# Patient Record
Sex: Male | Born: 1940 | Race: White | Hispanic: No | Smoking: Former smoker
Health system: Southern US, Community
[De-identification: ages and names within clinical notes are randomized; demographics above are authoritative.]

## PROBLEM LIST (undated history)

## (undated) DIAGNOSIS — I10 Essential (primary) hypertension: Secondary | ICD-10-CM

## (undated) DIAGNOSIS — K5792 Diverticulitis of intestine, part unspecified, without perforation or abscess without bleeding: Secondary | ICD-10-CM

## (undated) DIAGNOSIS — E785 Hyperlipidemia, unspecified: Secondary | ICD-10-CM

## (undated) DIAGNOSIS — N189 Chronic kidney disease, unspecified: Secondary | ICD-10-CM

## (undated) DIAGNOSIS — N433 Hydrocele, unspecified: Secondary | ICD-10-CM

## (undated) DIAGNOSIS — D649 Anemia, unspecified: Secondary | ICD-10-CM

## (undated) DIAGNOSIS — K7689 Other specified diseases of liver: Secondary | ICD-10-CM

## (undated) DIAGNOSIS — I714 Abdominal aortic aneurysm, without rupture, unspecified: Secondary | ICD-10-CM

## (undated) DIAGNOSIS — K759 Inflammatory liver disease, unspecified: Secondary | ICD-10-CM

## (undated) DIAGNOSIS — Z940 Kidney transplant status: Secondary | ICD-10-CM

## (undated) DIAGNOSIS — N179 Acute kidney failure, unspecified: Secondary | ICD-10-CM

## (undated) DIAGNOSIS — C801 Malignant (primary) neoplasm, unspecified: Secondary | ICD-10-CM

## (undated) DIAGNOSIS — E790 Hyperuricemia without signs of inflammatory arthritis and tophaceous disease: Secondary | ICD-10-CM

## (undated) DIAGNOSIS — M1990 Unspecified osteoarthritis, unspecified site: Secondary | ICD-10-CM

## (undated) DIAGNOSIS — M109 Gout, unspecified: Secondary | ICD-10-CM

## (undated) DIAGNOSIS — R7303 Prediabetes: Secondary | ICD-10-CM

## (undated) HISTORY — PX: COLONOSCOPY: SHX174

## (undated) HISTORY — DX: Acute kidney failure, unspecified: N17.9

## (undated) HISTORY — PX: KIDNEY TRANSPLANT: SHX239

## (undated) HISTORY — PX: HERNIA REPAIR: SHX51

## (undated) HISTORY — PX: OTHER SURGICAL HISTORY: SHX169

## (undated) HISTORY — DX: Chronic kidney disease, unspecified: N18.9

---

## 1995-12-12 DIAGNOSIS — Z940 Kidney transplant status: Secondary | ICD-10-CM

## 1995-12-12 HISTORY — DX: Kidney transplant status: Z94.0

## 2017-03-19 DIAGNOSIS — K5792 Diverticulitis of intestine, part unspecified, without perforation or abscess without bleeding: Secondary | ICD-10-CM

## 2017-03-19 HISTORY — DX: Diverticulitis of intestine, part unspecified, without perforation or abscess without bleeding: K57.92

## 2017-03-24 ENCOUNTER — Inpatient Hospital Stay (HOSPITAL_COMMUNITY): Payer: Medicare Other

## 2017-03-24 ENCOUNTER — Inpatient Hospital Stay (HOSPITAL_COMMUNITY)
Admission: EM | Admit: 2017-03-24 | Discharge: 2017-03-28 | DRG: 378 | Disposition: A | Discharge status: Home Health | Payer: Medicare Other | Attending: Internal Medicine | Admitting: Internal Medicine

## 2017-03-24 ENCOUNTER — Encounter (HOSPITAL_COMMUNITY): Payer: Self-pay

## 2017-03-24 DIAGNOSIS — Z87891 Personal history of nicotine dependence: Secondary | ICD-10-CM | POA: Diagnosis not present

## 2017-03-24 DIAGNOSIS — D509 Iron deficiency anemia, unspecified: Secondary | ICD-10-CM | POA: Diagnosis present

## 2017-03-24 DIAGNOSIS — K625 Hemorrhage of anus and rectum: Secondary | ICD-10-CM

## 2017-03-24 DIAGNOSIS — E875 Hyperkalemia: Secondary | ICD-10-CM | POA: Diagnosis present

## 2017-03-24 DIAGNOSIS — I129 Hypertensive chronic kidney disease with stage 1 through stage 4 chronic kidney disease, or unspecified chronic kidney disease: Secondary | ICD-10-CM | POA: Diagnosis present

## 2017-03-24 DIAGNOSIS — N179 Acute kidney failure, unspecified: Secondary | ICD-10-CM

## 2017-03-24 DIAGNOSIS — Z940 Kidney transplant status: Secondary | ICD-10-CM | POA: Diagnosis not present

## 2017-03-24 DIAGNOSIS — D62 Acute posthemorrhagic anemia: Secondary | ICD-10-CM

## 2017-03-24 DIAGNOSIS — T380X5A Adverse effect of glucocorticoids and synthetic analogues, initial encounter: Secondary | ICD-10-CM | POA: Diagnosis present

## 2017-03-24 DIAGNOSIS — K922 Gastrointestinal hemorrhage, unspecified: Secondary | ICD-10-CM | POA: Diagnosis present

## 2017-03-24 DIAGNOSIS — K5792 Diverticulitis of intestine, part unspecified, without perforation or abscess without bleeding: Secondary | ICD-10-CM | POA: Diagnosis present

## 2017-03-24 DIAGNOSIS — K7689 Other specified diseases of liver: Secondary | ICD-10-CM

## 2017-03-24 DIAGNOSIS — I951 Orthostatic hypotension: Secondary | ICD-10-CM | POA: Diagnosis present

## 2017-03-24 DIAGNOSIS — D72829 Elevated white blood cell count, unspecified: Secondary | ICD-10-CM

## 2017-03-24 DIAGNOSIS — E860 Dehydration: Secondary | ICD-10-CM | POA: Diagnosis not present

## 2017-03-24 DIAGNOSIS — Z8249 Family history of ischemic heart disease and other diseases of the circulatory system: Secondary | ICD-10-CM | POA: Diagnosis not present

## 2017-03-24 DIAGNOSIS — K921 Melena: Secondary | ICD-10-CM | POA: Diagnosis present

## 2017-03-24 DIAGNOSIS — I714 Abdominal aortic aneurysm, without rupture, unspecified: Secondary | ICD-10-CM

## 2017-03-24 DIAGNOSIS — T8611 Kidney transplant rejection: Secondary | ICD-10-CM | POA: Diagnosis present

## 2017-03-24 DIAGNOSIS — E538 Deficiency of other specified B group vitamins: Secondary | ICD-10-CM | POA: Diagnosis not present

## 2017-03-24 DIAGNOSIS — N028 Recurrent and persistent hematuria with other morphologic changes: Secondary | ICD-10-CM | POA: Diagnosis present

## 2017-03-24 DIAGNOSIS — N184 Chronic kidney disease, stage 4 (severe): Secondary | ICD-10-CM | POA: Diagnosis present

## 2017-03-24 DIAGNOSIS — N281 Cyst of kidney, acquired: Secondary | ICD-10-CM

## 2017-03-24 DIAGNOSIS — E785 Hyperlipidemia, unspecified: Secondary | ICD-10-CM | POA: Diagnosis present

## 2017-03-24 DIAGNOSIS — E790 Hyperuricemia without signs of inflammatory arthritis and tophaceous disease: Secondary | ICD-10-CM | POA: Diagnosis not present

## 2017-03-24 DIAGNOSIS — I10 Essential (primary) hypertension: Secondary | ICD-10-CM | POA: Diagnosis present

## 2017-03-24 DIAGNOSIS — R000 Tachycardia, unspecified: Secondary | ICD-10-CM

## 2017-03-24 DIAGNOSIS — N189 Chronic kidney disease, unspecified: Secondary | ICD-10-CM

## 2017-03-24 DIAGNOSIS — Z7982 Long term (current) use of aspirin: Secondary | ICD-10-CM

## 2017-03-24 DIAGNOSIS — K5733 Diverticulitis of large intestine without perforation or abscess with bleeding: Principal | ICD-10-CM | POA: Diagnosis present

## 2017-03-24 DIAGNOSIS — K5793 Diverticulitis of intestine, part unspecified, without perforation or abscess with bleeding: Secondary | ICD-10-CM | POA: Diagnosis not present

## 2017-03-24 HISTORY — DX: Chronic kidney disease, unspecified: N18.9

## 2017-03-24 HISTORY — DX: Abdominal aortic aneurysm, without rupture, unspecified: I71.40

## 2017-03-24 HISTORY — DX: Abdominal aortic aneurysm, without rupture: I71.4

## 2017-03-24 HISTORY — DX: Hyperuricemia without signs of inflammatory arthritis and tophaceous disease: E79.0

## 2017-03-24 HISTORY — DX: Other specified diseases of liver: K76.89

## 2017-03-24 HISTORY — DX: Acute kidney failure, unspecified: N17.9

## 2017-03-24 HISTORY — DX: Hydrocele, unspecified: N43.3

## 2017-03-24 HISTORY — DX: Kidney transplant status: Z94.0

## 2017-03-24 HISTORY — DX: Hyperlipidemia, unspecified: E78.5

## 2017-03-24 HISTORY — DX: Essential (primary) hypertension: I10

## 2017-03-24 LAB — BASIC METABOLIC PANEL WITH GFR
Anion gap: 14 (ref 5–15)
BUN: 95 mg/dL — ABNORMAL HIGH (ref 6–20)
CO2: 22 mmol/L (ref 22–32)
Calcium: 8.8 mg/dL — ABNORMAL LOW (ref 8.9–10.3)
Chloride: 100 mmol/L — ABNORMAL LOW (ref 101–111)
Creatinine, Ser: 3.35 mg/dL — ABNORMAL HIGH (ref 0.61–1.24)
GFR calc Af Amer: 19 mL/min — ABNORMAL LOW
GFR calc non Af Amer: 16 mL/min — ABNORMAL LOW
Glucose, Bld: 195 mg/dL — ABNORMAL HIGH (ref 65–99)
Potassium: 4.6 mmol/L (ref 3.5–5.1)
Sodium: 136 mmol/L (ref 135–145)

## 2017-03-24 LAB — CBC
HCT: 29.2 % — ABNORMAL LOW (ref 39.0–52.0)
Hemoglobin: 9.4 g/dL — ABNORMAL LOW (ref 13.0–17.0)
MCH: 29.7 pg (ref 26.0–34.0)
MCHC: 32.2 g/dL (ref 30.0–36.0)
MCV: 92.1 fL (ref 78.0–100.0)
PLATELETS: 264 10*3/uL (ref 150–400)
RBC: 3.17 MIL/uL — ABNORMAL LOW (ref 4.22–5.81)
RDW: 14.7 % (ref 11.5–15.5)
WBC: 12.9 10*3/uL — AB (ref 4.0–10.5)

## 2017-03-24 LAB — CBC WITH DIFFERENTIAL/PLATELET
BASOS PCT: 0 %
Basophils Absolute: 0 10*3/uL (ref 0.0–0.1)
EOS ABS: 0.1 10*3/uL (ref 0.0–0.7)
Eosinophils Relative: 1 %
HCT: 28.2 % — ABNORMAL LOW (ref 39.0–52.0)
Hemoglobin: 9.3 g/dL — ABNORMAL LOW (ref 13.0–17.0)
Lymphocytes Relative: 7 %
Lymphs Abs: 0.9 10*3/uL (ref 0.7–4.0)
MCH: 30.2 pg (ref 26.0–34.0)
MCHC: 33 g/dL (ref 30.0–36.0)
MCV: 91.6 fL (ref 78.0–100.0)
MONO ABS: 0.8 10*3/uL (ref 0.1–1.0)
Monocytes Relative: 6 %
NEUTROS PCT: 86 %
Neutro Abs: 11.1 10*3/uL — ABNORMAL HIGH (ref 1.7–7.7)
PLATELETS: 215 10*3/uL (ref 150–400)
RBC: 3.08 MIL/uL — AB (ref 4.22–5.81)
RDW: 14.8 % (ref 11.5–15.5)
WBC: 12.9 10*3/uL — AB (ref 4.0–10.5)

## 2017-03-24 LAB — MRSA PCR SCREENING: MRSA BY PCR: NEGATIVE

## 2017-03-24 LAB — IRON AND TIBC
Iron: 19 ug/dL — ABNORMAL LOW (ref 45–182)
Saturation Ratios: 9 % — ABNORMAL LOW (ref 17.9–39.5)
TIBC: 223 ug/dL — ABNORMAL LOW (ref 250–450)
UIBC: 204 ug/dL

## 2017-03-24 LAB — FOLATE: Folate: 23.9 ng/mL (ref >5.9)

## 2017-03-24 LAB — RETICULOCYTES
RBC.: 3.17 MIL/uL — AB (ref 4.22–5.81)
RETIC COUNT ABSOLUTE: 57.1 10*3/uL (ref 19.0–186.0)
RETIC CT PCT: 1.8 % (ref 0.4–3.1)

## 2017-03-24 LAB — FERRITIN: FERRITIN: 52 ng/mL (ref 24–336)

## 2017-03-24 LAB — VITAMIN B12: Vitamin B-12: 357 pg/mL (ref 180–914)

## 2017-03-24 MED ORDER — METOPROLOL TARTRATE 25 MG PO TABS
50.0000 mg | ORAL_TABLET | Freq: Two times a day (BID) | ORAL | Status: DC
  Administered 2017-03-24 – 2017-03-25 (×3): 50 mg via ORAL
  Filled 2017-03-24 (×3): qty 2

## 2017-03-24 MED ORDER — METRONIDAZOLE IN NACL 5-0.79 MG/ML-% IV SOLN
500.0000 mg | Freq: Three times a day (TID) | INTRAVENOUS | Status: DC
  Administered 2017-03-24 – 2017-03-27 (×9): 500 mg via INTRAVENOUS
  Filled 2017-03-24 (×10): qty 100

## 2017-03-24 MED ORDER — OXYCODONE HCL 5 MG PO TABS
5.0000 mg | ORAL_TABLET | ORAL | Status: DC | PRN
  Administered 2017-03-24: 5 mg via ORAL
  Filled 2017-03-24: qty 1

## 2017-03-24 MED ORDER — CALCIUM CARBONATE 1250 (500 CA) MG PO TABS
1.0000 | ORAL_TABLET | Freq: Every day | ORAL | Status: DC
  Administered 2017-03-24 – 2017-03-27 (×4): 500 mg via ORAL
  Filled 2017-03-24 (×3): qty 1

## 2017-03-24 MED ORDER — ACETAMINOPHEN 325 MG PO TABS: 650.0000 mg | ORAL_TABLET | Freq: Four times a day (QID) | ORAL | Status: DC | PRN

## 2017-03-24 MED ORDER — SORBITOL 70 % SOLN
30.0000 mL | Freq: Every day | Status: DC | PRN
  Filled 2017-03-24: qty 30

## 2017-03-24 MED ORDER — CALCIUM GLUCONATE 500 MG PO TABS: 500.0000 mg | ORAL_TABLET | Freq: Two times a day (BID) | ORAL | Status: DC

## 2017-03-24 MED ORDER — PREDNISONE 5 MG PO TABS
5.0000 mg | ORAL_TABLET | Freq: Every day | ORAL | Status: DC
  Administered 2017-03-25 – 2017-03-28 (×4): 5 mg via ORAL
  Filled 2017-03-24 (×4): qty 1

## 2017-03-24 MED ORDER — SODIUM CHLORIDE 0.9 % IV BOLUS (SEPSIS)
500.0000 mL | Freq: Once | INTRAVENOUS | Status: AC
  Administered 2017-03-24: 500 mL via INTRAVENOUS

## 2017-03-24 MED ORDER — CALCIUM GLUCONATE 500 MG PO TABS: 2.0000 | ORAL_TABLET | Freq: Every morning | ORAL | Status: DC

## 2017-03-24 MED ORDER — CYCLOSPORINE MODIFIED (NEORAL) 100 MG PO CAPS
100.0000 mg | ORAL_CAPSULE | Freq: Two times a day (BID) | ORAL | Status: DC
  Administered 2017-03-24 – 2017-03-25 (×2): 100 mg via ORAL
  Filled 2017-03-24 (×2): qty 1

## 2017-03-24 MED ORDER — ALLOPURINOL 100 MG PO TABS
100.0000 mg | ORAL_TABLET | Freq: Two times a day (BID) | ORAL | Status: DC
  Administered 2017-03-24 – 2017-03-28 (×8): 100 mg via ORAL
  Filled 2017-03-24 (×8): qty 1

## 2017-03-24 MED ORDER — ACETAMINOPHEN 650 MG RE SUPP: 650.0000 mg | Freq: Four times a day (QID) | RECTAL | Status: DC | PRN

## 2017-03-24 MED ORDER — SENNOSIDES-DOCUSATE SODIUM 8.6-50 MG PO TABS: 1.0000 | ORAL_TABLET | Freq: Every evening | ORAL | Status: DC | PRN

## 2017-03-24 MED ORDER — CALCIUM CARBONATE 1250 (500 CA) MG PO TABS
2.0000 | ORAL_TABLET | Freq: Every day | ORAL | Status: DC
  Administered 2017-03-25 – 2017-03-28 (×4): 1000 mg via ORAL
  Filled 2017-03-24: qty 1
  Filled 2017-03-24: qty 2
  Filled 2017-03-24 (×4): qty 1
  Filled 2017-03-24: qty 2

## 2017-03-24 MED ORDER — SODIUM CHLORIDE 0.9 % IV SOLN
INTRAVENOUS | Status: AC
  Administered 2017-03-24 – 2017-03-25 (×2): via INTRAVENOUS

## 2017-03-24 MED ORDER — CIPROFLOXACIN IN D5W 200 MG/100ML IV SOLN
200.0000 mg | Freq: Two times a day (BID) | INTRAVENOUS | Status: DC
  Administered 2017-03-24 – 2017-03-27 (×6): 200 mg via INTRAVENOUS
  Filled 2017-03-24 (×6): qty 100

## 2017-03-24 MED ORDER — ONDANSETRON HCL 4 MG PO TABS: 4.0000 mg | ORAL_TABLET | Freq: Four times a day (QID) | ORAL | Status: DC | PRN

## 2017-03-24 MED ORDER — ATORVASTATIN CALCIUM 10 MG PO TABS
20.0000 mg | ORAL_TABLET | Freq: Every day | ORAL | Status: DC
  Administered 2017-03-25 – 2017-03-28 (×4): 20 mg via ORAL
  Filled 2017-03-24 (×2): qty 2
  Filled 2017-03-24 (×2): qty 1
  Filled 2017-03-24: qty 2
  Filled 2017-03-24: qty 1
  Filled 2017-03-24: qty 2

## 2017-03-24 MED ORDER — ONDANSETRON HCL 4 MG/2ML IJ SOLN: 4.0000 mg | Freq: Four times a day (QID) | INTRAMUSCULAR | Status: DC | PRN

## 2017-03-24 MED ORDER — IOPAMIDOL (ISOVUE-300) INJECTION 61%
INTRAVENOUS | Status: AC
  Administered 2017-03-24: 30 mL via ORAL
  Filled 2017-03-24: qty 30

## 2017-03-24 MED ORDER — CALCIUM GLUCONATE 500 MG PO TABS
1.0000 | ORAL_TABLET | Freq: Every day | ORAL | Status: DC
  Filled 2017-03-24: qty 1

## 2017-03-24 NOTE — H&P (Signed)
History and Physical    Caleb Hicks BDZ:[id number] DOB: Sep 13, 1940 DOA: 03/24/2017  PCP: Dr Linnell Fulling [facility]) Patient coming from: Home  I have personally briefly reviewed patient's old medical records in [place]  Chief Complaint: Bloody stools  HPI: Caleb Hicks is a 76 y.o. male with medical history significant of status post renal transplant approximately 21 years ago, hypertension, hyperlipidemia, AAA who presents to the ED with a one-day history of multiple bloody bowel movements. Patient stated that around 6 or 7 PM the night prior to admission had multiple bloody bowel movements which are bright red in color. Patient stated that bowel movements continued through the morning of admission and he subsequently presented to the ED where he had another bloody bowel movement. Patient denies any melanotic stools, no hematemesis. Patient denies any fevers, no chills, no nausea, no vomiting, no chest pain, no shortness of breath, no abdominal pain, no diarrhea, no prior history of bloody bowel movements, no history of peptic ulcer disease, no history of diverticulosis, no history of NSAID use. Patient is on chronic prednisone. Patient does state good urine output. Patient does endorse some generalized weakness, near syncope, lightheadedness.  ED Course: Patient seen in the emergency room, basic metabolic profile obtained at a BUN of 95 creatinine of 3.35 otherwise was within normal limits. CBC had a white count of 12.9, hemoglobin of 9.3, platelet count of 2:15. Urinalysis not done. EKG showed a sinus tachycardia. ED physician stated she spoke with gastroenterology from [facility], Dr Remi Haggard who recommended CT abdomen and pelvis and will formally consult on the patient.  Review of Systems: As per HPI otherwise 10 point review of systems negative.   Past Medical History:  Diagnosis Date  . AAA (abdominal aortic aneurysm) ([place]): 4 cm 03/24/2017  . Abdominal aneurysm ([place])   .  Hepatic cyst: Solitary 03/24/2017  . Hydrocele sac   . Hyperlipidemia 03/24/2017  . Hypertension   . Hyperuricemia 03/24/2017  . 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 No pertinent surgical history.   reports that he has quit smoking. He has never used smokeless tobacco. He reports that he drinks about 1.2 oz of alcohol per week . He reports that he does not use drugs.  Allergies  Allergen Reactions  . Norvasc [Amlodipine Besylate]     Pt state it cause him weight gain    Family History  Problem Relation Age of Onset  . Heart attack Father    Mother deceased age 13 from old age. Father deceased age 54 in 71 from a probable MI per patient. Patient is currently divorced and lives in [place] close to his daughter.  Prior to Admission medications   Medication Sig Start Date End Date Taking? Authorizing Provider  allopurinol (ZYLOPRIM) 100 MG tablet Take 100 mg by mouth 2 (two) times daily. 03/13/17  Yes [provider]  atorvastatin (LIPITOR) 20 MG tablet Take 20 mg by mouth daily. 02/20/17  Yes [provider]  calcium gluconate 500 MG tablet Take 500-1,000 mg by mouth 2 (two) times daily.   Yes [provider]  FLUAD 0.5 ML SUSY Inject 1 Dose into the muscle once. 03/16/17  Yes [provider]  furosemide (LASIX) 40 MG tablet Take 40 mg by mouth daily. 03/13/17  Yes [provider]  metoprolol tartrate (LOPRESSOR) 50 MG tablet Take 50 mg by mouth 2 (two) times daily. 12/25/16  Yes [provider]  NEORAL 100 MG capsule Take 100 mg by mouth 2 (two) times daily. 02/27/17  Yes [provider]  predniSONE (DELTASONE) 5 MG tablet Take 5 mg by mouth daily. 03/16/17  Yes [provider]  telmisartan (MICARDIS) 80 MG tablet Take 80 mg by mouth daily. 12/25/16  Yes [provider]  aspirin 325 MG tablet Take 325 mg by mouth daily.    [provider]    Physical Exam: Vitals:   03/24/17 1300 03/24/17 1445 03/24/17 1500 03/24/17 1530  BP: (!) 141/76 124/73 123/75 140/74  Pulse: (!) 115 (!) 120 (!) 118 (!) 112  Resp: 19 (!) 22 19 14   Temp:      TempSrc:      SpO2: 100% 96% 94% 98%  Weight:      Height:        Constitutional: NAD, calm, comfortable Vitals:   03/24/17 1300 03/24/17 1445 03/24/17 1500 03/24/17 1530  BP: (!) 141/76 124/73 123/75 140/74  Pulse: (!) 115 (!) 120 (!) 118 (!) 112  Resp: 19 (!) 22 19 14   Temp:      TempSrc:      SpO2: 100% 96% 94% 98%  Weight:      Height:       Eyes: PERRLA, EOMI, lids and conjunctivae normal ENMT: Mucous membranes are moist. Posterior pharynx clear of any exudate or lesions.Normal dentition.  Neck: normal, supple, no masses, no thyromegaly Respiratory: clear to auscultation bilaterally, no wheezing, no crackles. Normal respiratory effort. No accessory muscle use.  Cardiovascular: Regular rate and rhythm, no murmurs / rubs / gallops. No extremity edema. 2+ pedal pulses. No carotid bruits.  Abdomen: no tenderness, no masses palpated. No hepatosplenomegaly. Bowel sounds positive.  Musculoskeletal: no clubbing / cyanosis. No joint deformity upper and lower extremities. Good ROM, no contractures. Normal muscle tone.  Skin: no rashes, lesions, ulcers. No induration Neurologic: CN 2-12 grossly intact. Sensation intact, DTR normal. Strength 5/5 in all 4.  Psychiatric: Normal judgment and insight. Alert and oriented x 3. Normal mood.   Labs on Admission: I have personally reviewed following labs and imaging studies  CBC:  Recent Labs Lab 03/24/17 1348  WBC 12.9*  NEUTROABS 11.1*  HGB 9.3*  HCT 28.2*  MCV 91.6  PLT 349   Basic Metabolic Panel:  Recent Labs Lab 03/24/17 1348  NA 136  K 4.6  CL 100*  CO2 22  GLUCOSE 195*  BUN 95*  CREATININE 3.35*  CALCIUM 8.8*   GFR: Estimated Creatinine Clearance: 17.5 mL/min (A) (by C-G formula based on SCr of 3.35  mg/dL (H)). Liver Function Tests: No results for input(s): AST, ALT, ALKPHOS, BILITOT, PROT, ALBUMIN in the last 168 hours. No results for input(s): LIPASE, AMYLASE in the last 168 hours. No results for input(s): AMMONIA in the last 168 hours. Coagulation Profile: No results for input(s): INR, PROTIME in the last 168 hours. Cardiac Enzymes: No results for input(s): CKTOTAL, CKMB, CKMBINDEX, TROPONINI in the last 168 hours. BNP (last 3 results) No results for input(s): PROBNP in the last 8760 hours. HbA1C: No results for input(s): HGBA1C in the last 72 hours. CBG: No results for input(s): GLUCAP in the last 168 hours. Lipid Profile: No results for input(s): CHOL, HDL, LDLCALC, TRIG, CHOLHDL, LDLDIRECT in the last 72 hours. Thyroid Function Tests: No results for input(s): TSH, T4TOTAL, FREET4, T3FREE, THYROIDAB in the last 72 hours. Anemia Panel: No results for input(s): VITAMINB12, FOLATE, FERRITIN, TIBC, IRON, RETICCTPCT in the last  72 hours. Urine analysis: No results found for: COLORURINE, APPEARANCEUR, LABSPEC, PHURINE, GLUCOSEU, HGBUR, BILIRUBINUR, KETONESUR, PROTEINUR, UROBILINOGEN, NITRITE, LEUKOCYTESUR  Radiological Exams on Admission: No results found.  EKG: Independently reviewed. 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 Consent for probable diverticular bleed. Patient with multiple numerous bloody bright red bowel movements since the evening prior to admission. Patient with no abdominal  pain. Patient with some lightheadedness and near syncopal episode. Patient given a 500 mL bolus of fluids. Will place patient on normal saline at 75 mL per hour. Check serial CBCs every 8 hours. Keep nothing by mouth except ice chips. Place on PPI. CT abdomen and pelvis has been ordered. ED physician at the recommendation of the gastroenterologist for further evaluation and management. GI to assess the patient. Follow.  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 Last creatinine was 2.30 from 01/11/2017. Likely a prerenal azotemia as patient presented with a GI bleed. Check a fractional excretion of sodium. Check a transplant ultrasound. Place on gentle hydration. Hold diuretics. Hold ARB. Check a cyclosporine level. Continue home regimen of prednisone and cyclosporine. If no significant improvement in renal function back to baseline we'll need to consult with nephrology. Follow.  #3 hyperuricemia Continue home dose allopurinol.  #4 hyperlipidemia Continue Lipitor.  #5 leukocytosis Likely secondary to chronic prednisone. Patient afebrile. Check UA with cultures and sensitivities. CT abdomen and pelvis pending. Patient with no respiratory symptoms. Check a chest x-ray. Follow.  #6 hypertension Hold ARB secondary to problem #2. Continue beta blocker.  #7 acute blood loss anemia Likely secondary to problem #1. Check an anemia panel. Follow H&H. Chest seizure threshold hemoglobin less than 7-8.  #8 sinus tachycardia Likely secondary to problem #1. Hydrated with IV fluids. Follow H&H. Resume home dose metoprolol.  #9 dehydration Gentle hydration.  DVT prophylaxis: SCDs Code Status: Full Family Communication: Updated patient. No family at bedside. Disposition Plan: Likely home once medically stable with clinical improvement. Consults called: [facility] per ED physician: Dr  Remi Haggard Admission status: To inpatient stepdown unit   [facility] MD [facility] Pager [phone]  If 7PM-7AM, please contact night-coverage [email or web address] Password [email or web address]  03/24/2017, 3:54 PM

## 2017-03-24 NOTE — ED Provider Notes (Signed)
[facility] DEPT Provider Note   CSN: [id number] Arrival date & time: 03/24/17  1219     History   Chief Complaint No chief complaint on file.   HPI Abayomi Pattison is a 76 y.o. male.  Patient is a 76 year old male with a history of AAA, kidney transplant about 21 years ago, hypertension who presents with rectal bleeding. He states it started last night about 6 PM. He's had multiple episodes of bloody stool since that time. He states he is mostly passing just blood without any stool. He's been feeling lightheaded and had 2 near-syncopal episodes this morning. He denies any abdominal pain. No nausea or vomiting. No fevers. No prior episodes of similar symptoms. He's had a colonoscopy one time in the past but he says was normal but this was done in [place]. He does not have a gastroenterologist here in [place].      Past Medical History:  Diagnosis Date  . Abdominal aneurysm ([place])   . Hydrocele sac   . Hypertension   . Kidney transplanted 12/12/1995    There are no active problems to display for this patient.   No past surgical history on file.     Home Medications    Prior to Admission medications   Medication Sig Start Date End Date Taking? Authorizing Provider  allopurinol (ZYLOPRIM) 100 MG tablet Take 100 mg by mouth 2 (two) times daily. 03/13/17  Yes [provider]  atorvastatin (LIPITOR) 20 MG tablet Take 20 mg by mouth daily. 02/20/17  Yes [provider]  calcium gluconate 500 MG tablet Take 500-1,000 mg by mouth 2 (two) times daily.   Yes [provider]  FLUAD 0.5 ML SUSY Inject 1 Dose into the muscle once. 03/16/17  Yes [provider]  furosemide (LASIX) 40 MG tablet Take 40 mg by mouth daily. 03/13/17  Yes [provider]  metoprolol tartrate (LOPRESSOR) 50 MG tablet Take 50 mg by mouth 2 (two) times daily. 12/25/16  Yes [provider]  NEORAL 25 MG capsule Take 100 mg by mouth 2 (two) times daily.  02/27/17  Yes [provider]  predniSONE (DELTASONE) 5 MG tablet Take 5 mg by mouth daily. 03/16/17  Yes [provider]  telmisartan (MICARDIS) 80 MG tablet Take 80 mg by mouth daily. 12/25/16  Yes [provider]    Family History No family history on file.  Social History Social History  Substance Use Topics  . Smoking status: Former Research scientist (life sciences)  . Smokeless tobacco: Never Used  . Alcohol use 1.2 oz/week    2 Glasses of wine per week     Allergies   Norvasc [amlodipine besylate]   Review of Systems Review of Systems  Constitutional: Positive for fatigue. Negative for chills, diaphoresis and fever.  HENT: Negative for congestion, rhinorrhea and sneezing.   Eyes: Negative.   Respiratory: Negative for cough, chest tightness and shortness of breath.   Cardiovascular: Negative for chest pain and leg swelling.  Gastrointestinal: Positive for blood in stool. Negative for abdominal pain, diarrhea, nausea and vomiting.  Genitourinary: Negative for difficulty urinating, flank pain, frequency and hematuria.  Musculoskeletal: Negative for arthralgias and back pain.  Skin: Negative for rash.  Neurological: Positive for light-headedness. Negative for dizziness, speech difficulty, weakness, numbness and headaches.     Physical Exam Updated Vital Signs BP 134/77 (BP Location: Right Arm)   Pulse (!) 120   Temp 97.7 F (36.5 C) (Oral)   Resp 18   Ht 5\' 7"  (1.702  m)   Wt 76.2 kg (168 lb)   SpO2 98%   BMI 26.31 kg/m   Physical Exam  Constitutional: He is oriented to person, place, and time. He appears well-developed and well-nourished.  HENT:  Head: Normocephalic and atraumatic.  Eyes: Pupils are equal, round, and reactive to light.  Neck: Normal range of motion. Neck supple.  Cardiovascular: Regular rhythm and normal heart sounds.  Tachycardia present.   Pulmonary/Chest: Effort normal and breath sounds normal. No respiratory distress. He has no wheezes. He  has no rales. He exhibits no tenderness.  Abdominal: Soft. Bowel sounds are normal. There is no tenderness. There is no rebound and no guarding.  Genitourinary:  Genitourinary Comments: Gross blood on rectal exam  Musculoskeletal: Normal range of motion. He exhibits no edema.  Lymphadenopathy:    He has no cervical adenopathy.  Neurological: He is alert and oriented to person, place, and time.  Skin: Skin is warm and dry. No rash noted.  Psychiatric: He has a normal mood and affect.     ED Treatments / Results  Labs (all labs ordered are listed, but only abnormal results are displayed) Labs Reviewed  BASIC METABOLIC PANEL - Abnormal; Notable for the following:       Result Value   Chloride 100 (*)    Glucose, Bld 195 (*)    BUN 95 (*)    Creatinine, Ser 3.35 (*)    Calcium 8.8 (*)    GFR calc non Af Amer 16 (*)    GFR calc Af Amer 19 (*)    All other components within normal limits  CBC WITH DIFFERENTIAL/PLATELET - Abnormal; Notable for the following:    WBC 12.9 (*)    RBC 3.08 (*)    Hemoglobin 9.3 (*)    HCT 28.2 (*)    Neutro Abs 11.1 (*)    All other components within normal limits  URINE CULTURE  URINALYSIS, ROUTINE W REFLEX MICROSCOPIC  TYPE AND SCREEN    EKG  EKG Interpretation  Date/Time:  Saturday March 24 2017 13:55:23 EDT Ventricular Rate:  117 PR Interval:    QRS Duration: 65 QT Interval:  292 QTC Calculation: 408 R Axis:   24 Text Interpretation:  Sinus tachycardia No old tracing to compare Confirmed by Malvin Johns [phone]) on 03/24/2017 2:05:34 PM       Radiology No results found.  Procedures Procedures (including critical care time)  Medications Ordered in ED Medications  sodium chloride 0.9 % bolus 500 mL (500 mLs Intravenous New Bag/Given 03/24/17 1446)  iopamidol (ISOVUE-300) 61 % injection (not administered)     Initial Impression / Assessment and Plan / ED Course  I have reviewed the triage vital signs and the nursing  notes.  Pertinent labs & imaging results that were available during my care of the patient were reviewed by me and considered in my medical decision making (see chart for details).     Patient presents with rectal bleeding. He has no associated abdominal pain. He is tachycardiac but his blood pressure is stable. His hemoglobin is just slightly lower to prior values on chart review. His last hemoglobin was 9.8 in July. His creatinine is slightly bumped as compared to his last prior values which have been around 2.3-2.6. He was typed and screened. He is not on anticoagulants other than aspirin. I spoke with Dr. Remi Haggard with [facility] gastroenterology who will see the patient. She does request a CT scan to assess for diverticular disease. This has been  ordered. I spoke with Dr. Grandville Silos with the hospitalist service who will admit the patient.  Final Clinical Impressions(s) / ED Diagnoses   Final diagnoses:  Rectal bleeding    New Prescriptions New Prescriptions   No medications on file     Malvin Johns, MD 03/24/17 1511

## 2017-03-24 NOTE — ED Notes (Signed)
Pt taken to restroom to get UA.  Only did stool

## 2017-03-24 NOTE — Progress Notes (Signed)
Consulted by ED physician Dr.Belfi at 2:50 pm. Patient presented with bright red blood per rectum and Hb almost the same range as of July. Was informed that patient has tachycardia but is not hypotensive. Recommended a CT scan of abdomen as patient has history of AAA. Spoke with Dr.Thompson-hospitalist at 6:00pm.  Was updated with CT result which shows acute diverticulitis of sigmoid-descending junction. Discussed with Dr.Thompson that colonoscopy would increase risk of perforation with diverticulitis and recommend monitoring of H and H. Recommend IV antibiotics. Clear  Liquid diet. If patient continues to have rectal bleeding or becomes hemodynamically unstable, recommend STAT bleeding scan and possible IR guided embolization. Will see patient in am.   Ronnette Juniper, MD [phone]

## 2017-03-24 NOTE — ED Triage Notes (Signed)
Patient coming from home with c/o rectal bleed started yesterday evening about 7 pm. Patient state he had 12 bloody stool since last night. Pt had near syncope this morning but did not fall.

## 2017-03-24 NOTE — ED Notes (Signed)
Bed: GZ49 Expected date:  Expected time:  Means of arrival:  Comments: 76 yo rectal bleed

## 2017-03-25 DIAGNOSIS — E538 Deficiency of other specified B group vitamins: Secondary | ICD-10-CM | POA: Diagnosis present

## 2017-03-25 DIAGNOSIS — D509 Iron deficiency anemia, unspecified: Secondary | ICD-10-CM | POA: Diagnosis present

## 2017-03-25 DIAGNOSIS — K5792 Diverticulitis of intestine, part unspecified, without perforation or abscess without bleeding: Secondary | ICD-10-CM

## 2017-03-25 LAB — CBC
HCT: 22.6 % — ABNORMAL LOW (ref 39.0–52.0)
HEMATOCRIT: 22.1 % — AB (ref 39.0–52.0)
HEMOGLOBIN: 7.3 g/dL — AB (ref 13.0–17.0)
Hemoglobin: 7.3 g/dL — ABNORMAL LOW (ref 13.0–17.0)
MCH: 29.6 pg (ref 26.0–34.0)
MCH: 30.3 pg (ref 26.0–34.0)
MCHC: 32.3 g/dL (ref 30.0–36.0)
MCHC: 33 g/dL (ref 30.0–36.0)
MCV: 91.5 fL (ref 78.0–100.0)
MCV: 91.7 fL (ref 78.0–100.0)
PLATELETS: 223 10*3/uL (ref 150–400)
Platelets: 220 10*3/uL (ref 150–400)
RBC: 2.41 MIL/uL — ABNORMAL LOW (ref 4.22–5.81)
RBC: 2.47 MIL/uL — AB (ref 4.22–5.81)
RDW: 14.6 % (ref 11.5–15.5)
RDW: 14.9 % (ref 11.5–15.5)
WBC: 9.5 10*3/uL (ref 4.0–10.5)
WBC: 9.9 10*3/uL (ref 4.0–10.5)

## 2017-03-25 LAB — COMPREHENSIVE METABOLIC PANEL
ALBUMIN: 2.3 g/dL — AB (ref 3.5–5.0)
ALT: 11 U/L — AB (ref 17–63)
ANION GAP: 11 (ref 5–15)
AST: 12 U/L — AB (ref 15–41)
Alkaline Phosphatase: 52 U/L (ref 38–126)
BUN: 89 mg/dL — AB (ref 6–20)
CO2: 22 mmol/L (ref 22–32)
Calcium: 8.3 mg/dL — ABNORMAL LOW (ref 8.9–10.3)
Chloride: 102 mmol/L (ref 101–111)
Creatinine, Ser: 3.12 mg/dL — ABNORMAL HIGH (ref 0.61–1.24)
GFR, EST AFRICAN AMERICAN: 21 mL/min — AB (ref >60)
GFR, EST NON AFRICAN AMERICAN: 18 mL/min — AB (ref >60)
Glucose, Bld: 135 mg/dL — ABNORMAL HIGH (ref 65–99)
POTASSIUM: 4.8 mmol/L (ref 3.5–5.1)
Sodium: 135 mmol/L (ref 135–145)
TOTAL PROTEIN: 4.9 g/dL — AB (ref 6.5–8.1)
Total Bilirubin: 0.4 mg/dL (ref 0.3–1.2)

## 2017-03-25 LAB — GLUCOSE, CAPILLARY: GLUCOSE-CAPILLARY: 133 mg/dL — AB (ref 65–99)

## 2017-03-25 LAB — HEMOGLOBIN AND HEMATOCRIT, BLOOD
HCT: 27.2 % — ABNORMAL LOW (ref 39.0–52.0)
Hemoglobin: 9.2 g/dL — ABNORMAL LOW (ref 13.0–17.0)

## 2017-03-25 LAB — PREPARE RBC (CROSSMATCH)

## 2017-03-25 LAB — MAGNESIUM: MAGNESIUM: 1.5 mg/dL — AB (ref 1.7–2.4)

## 2017-03-25 LAB — ABO/RH: ABO/RH(D): O NEG

## 2017-03-25 MED ORDER — DIPHENHYDRAMINE HCL 25 MG PO CAPS
25.0000 mg | ORAL_CAPSULE | Freq: Once | ORAL | Status: AC
  Administered 2017-03-25: 25 mg via ORAL
  Filled 2017-03-25: qty 1

## 2017-03-25 MED ORDER — CYCLOSPORINE MODIFIED (NEORAL) 25 MG PO CAPS
50.0000 mg | ORAL_CAPSULE | Freq: Every day | ORAL | Status: DC
  Administered 2017-03-25 – 2017-03-27 (×3): 50 mg via ORAL
  Filled 2017-03-25 (×3): qty 2

## 2017-03-25 MED ORDER — FUROSEMIDE 10 MG/ML IJ SOLN: 20.0000 mg | Freq: Once | INTRAMUSCULAR | Status: DC

## 2017-03-25 MED ORDER — CYCLOSPORINE MODIFIED (NEORAL) 25 MG PO CAPS
75.0000 mg | ORAL_CAPSULE | Freq: Every morning | ORAL | Status: DC
  Administered 2017-03-26 – 2017-03-28 (×3): 75 mg via ORAL
  Filled 2017-03-25 (×3): qty 3

## 2017-03-25 MED ORDER — SODIUM CHLORIDE 0.9 % IV SOLN
Freq: Once | INTRAVENOUS | Status: AC
  Administered 2017-03-25: 10:00:00 via INTRAVENOUS

## 2017-03-25 MED ORDER — CYANOCOBALAMIN 1000 MCG/ML IJ SOLN
1000.0000 ug | Freq: Every day | INTRAMUSCULAR | Status: DC
  Administered 2017-03-25 – 2017-03-28 (×4): 1000 ug via INTRAMUSCULAR
  Filled 2017-03-25 (×4): qty 1

## 2017-03-25 MED ORDER — ACETAMINOPHEN 325 MG PO TABS
650.0000 mg | ORAL_TABLET | Freq: Once | ORAL | Status: AC
  Administered 2017-03-25: 650 mg via ORAL
  Filled 2017-03-25: qty 2

## 2017-03-25 MED ORDER — SODIUM CHLORIDE 0.9 % IV SOLN: Freq: Once | INTRAVENOUS | Status: DC

## 2017-03-25 MED ORDER — MAGNESIUM SULFATE 4 GM/100ML IV SOLN
4.0000 g | Freq: Once | INTRAVENOUS | Status: AC
  Administered 2017-03-25: 4 g via INTRAVENOUS
  Filled 2017-03-25: qty 100

## 2017-03-25 NOTE — Consult Note (Signed)
[facility] Consult  Referring Provider: Malvin Johns, MD Primary Care Physician:  Dr Linnell Fulling [facility]) Primary Gastroenterologist: Althia Forts  Reason for Consultation:  Rectal bleeding  HPI: Caleb Hicks is a 76 y.o. Caucasian male on 03/24/17 with rectal bleeding for 1 day duration. Patient states he was in his usual state of health until Friday when he developed multiple episodes of bright red blood per rectum which continued overnight and prompted him to come to the ER the next day. This was not associated with any abdominal pain, nausea, vomiting, loss of appetite, fever, chills or rigors. Patient normally has a bowel movement in 1-2 days. He reports last colonoscopy performed in [place] 8-9 years ago, was unremarkable as per patient. He denies acid reflux, difficulty swallowing, pain on swallowing or use of any NSAIDs. There is no family history of colon cancer.    Past Medical History:  Diagnosis Date  . AAA (abdominal aortic aneurysm) ([place]): 4 cm 03/24/2017  . Abdominal aneurysm ([place])   . Hepatic cyst: Solitary 03/24/2017  . Hydrocele sac   . Hyperlipidemia 03/24/2017  . Hypertension   . Hyperuricemia 03/24/2017  . 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 No pertinent surgical history.  Prior to Admission medications   Medication Sig Start Date End Date Taking? Authorizing Provider  allopurinol (ZYLOPRIM) 100 MG tablet Take 100 mg by mouth 2 (two) times daily. 03/13/17  Yes [provider]  atorvastatin (LIPITOR) 20 MG tablet Take 20 mg by mouth daily. 02/20/17  Yes [provider]  calcium gluconate 500 MG tablet Take 500-1,000 mg by mouth 2 (two) times daily.   Yes [provider]  FLUAD 0.5 ML SUSY Inject 1 Dose into the muscle once. 03/16/17  Yes [provider]  furosemide (LASIX) 40 MG tablet Take 40 mg by mouth daily. 03/13/17  Yes  [provider]  metoprolol tartrate (LOPRESSOR) 50 MG tablet Take 50 mg by mouth 2 (two) times daily. 12/25/16  Yes [provider]  NEORAL 100 MG capsule Take 100 mg by mouth 2 (two) times daily. 02/27/17  Yes [provider]  predniSONE (DELTASONE) 5 MG tablet Take 5 mg by mouth daily. 03/16/17  Yes [provider]  telmisartan (MICARDIS) 80 MG tablet Take 80 mg by mouth daily. 12/25/16  Yes [provider]  aspirin 325 MG tablet Take 325 mg by mouth daily.    [provider]    Current Facility-Administered Medications  Medication Dose Route Frequency Provider Last Rate Last Dose  . 0.9 %  sodium chloride infusion   Intravenous Continuous Eugenie Filler, MD 75 mL/hr at 03/25/17 0000    . 0.9 %  sodium chloride infusion   Intravenous Once Eugenie Filler, MD      . 0.9 %  sodium chloride infusion   Intravenous Once Ronnette Juniper, MD      . acetaminophen (TYLENOL) tablet 650 mg  650 mg Oral Q6H PRN Eugenie Filler, MD       Or  . acetaminophen (TYLENOL) suppository 650 mg  650 mg Rectal Q6H PRN Eugenie Filler, MD      . acetaminophen (TYLENOL) tablet 650 mg  650 mg Oral Once Eugenie Filler, MD      . allopurinol (ZYLOPRIM) tablet 100 mg  100 mg Oral BID Eugenie Filler, MD   100 mg at 03/24/17 2217  . atorvastatin (LIPITOR) tablet 20  mg  20 mg Oral Daily Eugenie Filler, MD      . calcium carbonate (OS-CAL - dosed in mg of elemental calcium) tablet 1,000 mg of elemental calcium  2 tablet Oral Q breakfast Eugenie Filler, MD   1,000 mg of elemental calcium at 03/25/17 0817  . calcium carbonate (OS-CAL - dosed in mg of elemental calcium) tablet 500 mg of elemental calcium  1 tablet Oral QHS Eugenie Filler, MD   500 mg of elemental calcium at 03/24/17 2343  . ciprofloxacin (CIPRO) IVPB 200 mg  200 mg Intravenous Q12H Eugenie Filler, MD 100 mL/hr at 03/25/17 0822 200 mg at 03/25/17 1308  . cyanocobalamin ((VITAMIN  B-12)) injection 1,000 mcg  1,000 mcg Intramuscular Daily Eugenie Filler, MD      . cycloSPORINE modified (NEORAL) capsule 100 mg  100 mg Oral BID Eugenie Filler, MD   100 mg at 03/24/17 2216  . diphenhydrAMINE (BENADRYL) capsule 25 mg  25 mg Oral Once Eugenie Filler, MD      . furosemide (LASIX) injection 20 mg  20 mg Intravenous Once Eugenie Filler, MD      . metoprolol tartrate (LOPRESSOR) tablet 50 mg  50 mg Oral BID Eugenie Filler, MD   50 mg at 03/24/17 2216  . metroNIDAZOLE (FLAGYL) IVPB 500 mg  500 mg Intravenous Q8H Eugenie Filler, MD   Stopped at 03/25/17 [phone]  . ondansetron (ZOFRAN) tablet 4 mg  4 mg Oral Q6H PRN Eugenie Filler, MD       Or  . ondansetron [facility]) injection 4 mg  4 mg Intravenous Q6H PRN Eugenie Filler, MD      . oxyCODONE (Oxy IR/ROXICODONE) immediate release tablet 5 mg  5 mg Oral Q4H PRN Eugenie Filler, MD   5 mg at 03/24/17 2216  . predniSONE (DELTASONE) tablet 5 mg  5 mg Oral Daily Eugenie Filler, MD      . senna-docusate (Senokot-S) tablet 1 tablet  1 tablet Oral QHS PRN Eugenie Filler, MD      . sorbitol 70 % solution 30 mL  30 mL Oral Daily PRN Eugenie Filler, MD        Allergies as of 03/24/2017 - Review Complete 03/24/2017  Allergen Reaction Noted  . Norvasc [amlodipine besylate]  03/24/2017    Family History  Problem Relation Age of Onset  . Heart attack Father     Social History   Social History  . Marital status: Divorced    Spouse name: N/A  . Number of children: N/A  . Years of education: N/A   Occupational History  . Not on file.   Social History Main Topics  . Smoking status: Former Research scientist (life sciences)  . Smokeless tobacco: Never Used  . Alcohol use 1.2 oz/week    2 Glasses of wine per week  . Drug use: No  . Sexual activity: Not on file   Other Topics Concern  . Not on file   Social History Narrative  . No narrative on file    Review of Systems: Positive for: GI: Described in detail in  HPI.    Gen: fatigue, weakness, Denies any fever, chills, rigors, night sweats, anorexia, malaise, involuntary weight loss, and sleep disorder CV: Denies chest pain, angina, palpitations, syncope, orthopnea, PND, peripheral edema, and claudication. Resp: Denies dyspnea, cough, sputum, wheezing, coughing up blood. GU : Denies urinary burning, blood in urine, urinary frequency, urinary hesitancy, nocturnal urination,  and urinary incontinence. MS: Denies joint pain or swelling.  Denies muscle weakness, cramps, atrophy.  Derm: Denies rash, itching, oral ulcerations, hives, unhealing ulcers.  Psych: Denies depression, anxiety, memory loss, suicidal ideation, hallucinations,  and confusion. Heme: Denies bruising, bleeding, and enlarged lymph nodes. Neuro:  dizziness Denies any headaches,, paresthesias. Endo:  Denies any problems with DM, thyroid, adrenal function.  Physical Exam: Vital signs in last 24 hours: Temp:  [97.4 F (36.3 C)-98.3 F (36.8 C)] 97.4 F (36.3 C) (10/07 0800) Pulse Rate:  [72-124] 81 (10/07 0800) Resp:  [13-23] 13 (10/07 0800) BP: (112-157)/(46-78) 157/75 (10/07 0800) SpO2:  [92 %-100 %] 98 % (10/07 0800) Weight:  [72 kg (158 lb 11.7 oz)-76.2 kg (168 lb)] 72 kg (158 lb 11.7 oz) (10/07 0600) Last BM Date: 03/24/17  General:   Alert,  Well-developed, well-nourished, pleasant and cooperative in NAD Head:  Normocephalic and atraumatic. Eyes:  Sclera clear, no icterus.   Mild pallor. Ears:  Normal auditory acuity. Nose:  No deformity, discharge,  or lesions. Mouth:  No deformity or lesions.  Oropharynx pink & moist. Neck:  Supple; no masses or thyromegaly. Lungs:  Clear throughout to auscultation.   No wheezes, crackles, or rhonchi. No acute distress. Heart:  Regular rate and rhythm; no murmurs, clicks, rubs,  or gallops. Extremities:  Without clubbing or edema. Neurologic:  Alert and  oriented x4;  grossly normal neurologically. Skin:  Intact without significant  lesions or rashes. Psych:  Alert and cooperative. Normal mood and affect. Abdomen:  Soft, nontender and nondistended. No masses, hepatosplenomegaly or hernias noted. Normal bowel sounds, without guarding, and without rebound.    Transplanted kidney palpable in the right lower quadrant.     Lab Results:  Recent Labs  03/24/17 1548 03/24/17 2331 03/25/17 0706  WBC 12.9* 9.9 9.5  HGB 9.4* 7.3* 7.3*  HCT 29.2* 22.6* 22.1*  PLT 264 223 220   BMET  Recent Labs  03/24/17 1348 03/25/17 0706  NA 136 135  K 4.6 4.8  CL 100* 102  CO2 22 22  GLUCOSE 195* 135*  BUN 95* 89*  CREATININE 3.35* 3.12*  CALCIUM 8.8* 8.3*   LFT  Recent Labs  03/25/17 0706  PROT 4.9*  ALBUMIN 2.3*  AST 12*  ALT 11*  ALKPHOS 52  BILITOT 0.4   PT/INR No results for input(s): LABPROT, INR in the last 72 hours.  Studies/Results: Ct Abdomen Pelvis Wo Contrast  Result Date: 03/24/2017 CLINICAL DATA:  76 year old male with a history of AAA, kidney transplant about 21 years ago, hypertension who presents with rectal bleeding. He states it started last night about 6 PM. He's had multiple episodes of bloody stool since that time. He states he is mostly passing just blood without any stool. He's been feeling lightheaded and had 2 near-syncopal episodes this morning. He denies any abdominal pain. No nausea or vomiting. No fevers. No prior episodes of similar symptoms. He's had a colonoscopy one time in the past but he says was normal but this was done in [place]. EXAM: CT ABDOMEN AND PELVIS WITHOUT CONTRAST TECHNIQUE: Multidetector CT imaging of the abdomen and pelvis was performed following the standard protocol without IV contrast. COMPARISON:  None. FINDINGS: Lower chest: Mild right basilar atelectasis. Hepatobiliary: No focal hepatic mass. No intrahepatic or extrahepatic biliary ductal dilatation. High attenuation material along the deep tendon aspect of the gallbladder likely reflecting gallbladder  sludge versus tiny cholelithiasis. Pancreas: Unremarkable. No pancreatic ductal dilatation or surrounding inflammatory changes. Spleen: Normal  in size without focal abnormality. Adrenals/Urinary Tract: Normal adrenal glands. Severely atrophic bilateral native kidneys. Multiple bilateral hypodense, fluid attenuating bilateral renal masses most consistent with cysts. Along the inferior pole of the right kidney there is a 15 mm hyperdense right renal mass measuring 81 Hounsfield units most consistent with a hemorrhagic cyst. Large hypodense 3.5 x 4.4 cm midpole right renal mass with high attenuation anterior layering dependently likely reflecting a proteinaceous cyst. Transplanted kidney is noted in the right iliac fossa. No obstructive uropathy. No urolithiasis. Normal bladder. Stomach/Bowel: No bowel dilatation to suggest bowel obstruction. Diverticulosis of the descending colon and sigmoid colon with mild pericolonic inflammatory changes at the descending colon- sigmoid colon junction concerning for mild diverticulitis. No peridiverticular fluid collection to suggest an abscess. No pneumatosis, pneumoperitoneum or portal venous gas. No abdominal or pelvic free fluid. Vascular/Lymphatic: Abdominal aortic atherosclerosis. Infrarenal abdominal aortic aneurysm measuring 3.5 x 3.5 cm. No lymphadenopathy. Reproductive: Prostate is unremarkable. Other: Fat containing supraumbilical hernia. No abdominopelvic ascites. Musculoskeletal: No acute osseous abnormality. No lytic or sclerotic osseous lesion. 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 Grade 1 anterolisthesis of L4 on L5 secondary to facet disease. Mild osteoarthritis of bilateral hips. IMPRESSION: 1. Mild acute diverticulitis of the descending colon-sigmoid colon junction. No peridiverticular fluid collection to suggest an abscess. 2. 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 3. 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 4. Infrarenal abdominal aortic aneurysm measured 3.5 x 3.5 cm. Recommend followup by ultrasound in 2 years. This recommendation follows ACR consensus guidelines: White Paper of the ACR Incidental Findings Committee II on Vascular Findings. J Am Coll Radiol 2013; 10:789-794. 5.  Aortic aneurysm NOS (ICD10-I71.9) 6.  Aortic Atherosclerosis (ICD10-170.0) Electronically Signed   By: Kathreen Devoid   On: 03/24/2017 16:39   US Renal Transplant W/doppler  Result Date: 03/24/2017 CLINICAL DATA:  Acute renal failure, hypertension, prior kidney transplant EXAM: ULTRASOUND OF RENAL TRANSPLANT WITH RENAL DOPPLER ULTRASOUND TECHNIQUE: Ultrasound examination of the renal transplant was performed with gray-scale, color and duplex doppler evaluation. COMPARISON:  CT abdomen and pelvis 03/24/2017 FINDINGS: Transplant kidney location: RIGHT iliac fossa Transplant Kidney: Length: 12.4 cm. Normal in size and parenchymal echogenicity. No evidence of mass or hydronephrosis. No peri-transplant fluid collection seen. Color flow in the main renal artery:  Present Color flow in the main renal vein:  Present Duplex Doppler Evaluation: Main Renal Artery Resistive Index: 0.74 Venous waveform in main renal vein:  Present Intrarenal resistive index in upper pole:  0.64 (normal 0.6-0.8; equivocal 0.8-0.9; abnormal >= 0.9) Intrarenal resistive index in lower pole: 0.64 (normal 0.6-0.8; equivocal 0.8-0.9; abnormal >= 0.9) Bladder: Normal appearance. Transplant ureter jet was not visualized. Other findings:  None. IMPRESSION: Unremarkable transplant kidney at RIGHT iliac fossa. Electronically Signed   By: Lavonia Dana M.D.   On: 03/24/2017 18:35   Dg Chest Port 1 View  Result Date: 03/24/2017 CLINICAL DATA:  Acute onset of generalized weakness. Near syncope. Leukocytosis. Initial encounter. EXAM: PORTABLE CHEST 1 VIEW COMPARISON:  None. FINDINGS: The lungs are well-aerated  and clear. There is no evidence of focal opacification, pleural effusion or pneumothorax. The cardiomediastinal silhouette is within normal limits. No acute osseous abnormalities are seen. IMPRESSION: No acute cardiopulmonary process seen. Electronically Signed   By: Garald Balding M.D.   On: 03/24/2017 18:36    Impression: 1. Hematochezia likely secondary to diverticulosis, hemoglobin 7.3 from 9.3 on admission 2. Acute  diverticulitis at the junction of sigmoid and descending colon 3. 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 Abdominal aortic aneurysm 3.5 and 3.5 cm  Plan: Patient has not had a bowel movement since 6 PM yesterday, likely signifies that the bleeding has stopped. Recommend that patient be started on full liquid diet. Patient complains of dizziness and weakness, although he is hemodynamically stable, was noted to have orthostatic hypotension. Recommend 1 unit PRBC transfusion today. Discussed with the patient that with acute diverticulitis I would avoid colonoscopy. Recommend treating with IV antibiotics for a total of 10 days. Colonoscopy as an outpatient in 4-6 weeks. Monitor H&H. If further bleeding noted next up would be a bleeding scan and possible IR guided embolization.   LOS: 1 day   Ronnette Juniper, M.D.  03/25/2017, 8:32 AM  Pager [phone] If no answer or after 5 PM call [phone]

## 2017-03-25 NOTE — Progress Notes (Signed)
PT Cancellation Note  Patient Details Name: Caleb Hicks MRN: [id number] DOB: July 22, 1940   Cancelled Treatment:    Reason Eval/Treat Not Completed: Medical issues which prohibited therapy Getting 2 units of blood  [name] 03/25/2017, 12:46 PM Tresa Endo PT [phone]

## 2017-03-25 NOTE — Progress Notes (Signed)
PROGRESS NOTE    Rodrick Payson  WCB:[id number] DOB: [birth year] DOA: 03/24/2017 PCP: Patient, No Pcp Per    Brief Narrative:  Patient is a 76 year old gentleman history of chronic kidney disease stage IV, [name], hypertension, hyperlipidemia presenting to the ED with rectal bleeding 1 day. CT abdomen and pelvis also consistent with acute mild diverticulitis. Patient admitted to the step down unit. GI consulted. Patient placed empirically on IV ciprofloxacin and IV Flagyl.   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 CT abdomen and pelvis with mild acute diverticulitis of the descending colon-sigmoid colon junction. Negative for abscess. GI bleed seems to be slowing down. Patient with small bloody bowel movement today. Hemoglobin currently at 7.3 from 9.4 on admission. Patient complains of lightheadedness. Transfuse 2 units packed red blood cells. Follow H&H. If patient with continued bleeding will need a bleeding scan and possible IR guided embolization per GI recommendations. Diet has been advanced to a full liquid diet per GI. GI following.  #2 acute diverticulitis Per CT abdomen and pelvis. Patient denies any abdominal pain. Patient still with some bloody bowel movements. Continue empiric IV ciprofloxacin and IV Flagyl and treat for total of 10 days. Patient will need outpatient follow-up with GI for colonoscopy in about 4-6 weeks. GI following. Diet  has been advanced to a full liquid diet.  #3 acute blood loss anemia/iron deficiency anemia/low B-12 levels Secondary to problem #1. Hemoglobin currently at 7.3 from 9.4. Anemia panel consistent with iron deficiency anemia with low B-12 levels. Iron of 19, ferritin of 52, vitamin B-12 levels of 357. Patient to be transfused 2 units packed red blood cells. Follow H&H. Patient will likely benefit from IV iron at some point prior to discharge. Patient may need oral iron supplementation on discharge. Place vitamin B-12 1000 MCG's IM daily 1 week, and then weekly 1 month, and then monthly. Outpatient follow-up.  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 Renal transplant ultrasound with Dopplers unremarkable. Urine studies pending. Renal function slowly trending down with hydration. Cyclosporine level pending. Continue gentle hydration, cyclosporine, prednisone. Outpatient follow-up.  #5 hyperuricemia Continue home regimen of allopurinol.  #6 dehydration IV fluids.  #7 AAA Stable. 3.5 x 3.5 cm by CT abdomen and pelvis. Follow-up ultrasound in 2 years. Outpatient follow-up.  #8 hyperlipidemia Continue statin.  #9 leukocytosis Chest x-ray negative. CT abdomen and pelvis consistent with a mild acute diverticulitis. Urinalysis pending. Continue empiric IV ciprofloxacin and IV Flagyl.  #10 hypertension Continue home regimen beta blocker. ARB and diuretics on hold secondary to problem #4.  #11 sinus tachycardia Likely secondary to problem #1. Improved.   DVT prophylaxis: SCDs Code Status: Full Family Communication: Updated patient and daughter at bedside. Disposition Plan: Remain the step down unit today.   Consultants:   Gastroenterology: Tana Felts 03/25/2017  Procedures:  CT abdomen and pelvis 03/24/2017  Chest x-ray 03/24/2017  Renal transplant Ultrasound with Dopplers  03/24/2017  Transfuse 2 units packed red blood cells pending 03/25/2017  Antimicrobials:   IV ciprofloxacin 03/24/2017  IV Flagyl 03/24/2017   Subjective: Patient states had a small bloody bowel movement this morning. Patient also noted to have some small bloody bowel movements overnight. Patient with complaints of lightheadedness and dizziness. Patient denies any chest pain. No shortness of breath.  Objective: Vitals:   03/25/17 0600 03/25/17 0700 03/25/17 0800 03/25/17 0922  BP: 118/65 (!) 117/50 (!) 157/75 130/60  Pulse: 73 67 81   Resp: (!) 23 20 13 18   Temp:   (!) 97.4 F (36.3 C)   TempSrc:   Axillary   SpO2: 94% 96% 98%   Weight: 72 kg (158 lb 11.7 oz)     Height:        Intake/Output Summary (Last 24 hours) at 03/25/17 1023 Last data filed at 03/25/17 1005  Gross per 24 hour  Intake          1803.75 ml  Output                1 ml  Net          1802.75 ml   Filed Weights   03/24/17 1236 03/24/17 1700 03/25/17 0600  Weight: 76.2 kg (168 lb) 72.1 kg (158 lb 15.2 oz) 72 kg (158 lb 11.7 oz)    Examination:  General exam: Appears calm and comfortable  Respiratory system: Clear to auscultation. No wheezes, no crackles, no rhonchi. Respiratory effort normal. Cardiovascular system: S1 & S2 heard, RRR. No JVD, murmurs, rubs, gallops or clicks. No pedal edema. Gastrointestinal system: Abdomen is nondistended, soft and nontender. No organomegaly or masses felt. Normal bowel sounds heard. Central nervous system: Alert and oriented. No focal neurological deficits. Extremities: Symmetric 5 x 5 power. Skin: No rashes, lesions or ulcers Psychiatry: Judgement and insight appear normal. Mood & affect appropriate.     Data Reviewed: I have personally reviewed following labs and imaging studies  CBC:  Recent Labs Lab 03/24/17 1348 03/24/17 1548 03/24/17 2331 03/25/17 0706  WBC 12.9* 12.9* 9.9 9.5  NEUTROABS 11.1*  --   --   --   HGB 9.3* 9.4* 7.3* 7.3*  HCT 28.2*  29.2* 22.6* 22.1*  MCV 91.6 92.1 91.5 91.7  PLT 215 264 223 175   Basic Metabolic Panel:  Recent Labs Lab 03/24/17 1348 03/25/17 0706  NA 136 135  K 4.6 4.8  CL 100* 102  CO2 22 22  GLUCOSE 195* 135*  BUN 95* 89*  CREATININE 3.35* 3.12*  CALCIUM 8.8* 8.3*  MG  --  1.5*   GFR: Estimated Creatinine Clearance: 18.8 mL/min (A) (by C-G formula based on SCr of 3.12 mg/dL (H)). Liver Function Tests:  Recent Labs Lab 03/25/17 0706  AST 12*  ALT 11*  ALKPHOS 52  BILITOT 0.4  PROT 4.9*  ALBUMIN 2.3*   No results for input(s): LIPASE, AMYLASE in the last 168 hours. No results for input(s): AMMONIA in the last 168 hours. Coagulation Profile: No results for input(s): INR, PROTIME in the last 168 hours. Cardiac Enzymes: No results for input(s): CKTOTAL, CKMB, CKMBINDEX, TROPONINI in the last 168 hours. BNP (last 3 results) No results for input(s): PROBNP in the last 8760 hours. HbA1C: No results for input(s): HGBA1C in the last 72 hours. CBG:  Recent Labs Lab 03/25/17 0813  GLUCAP 133*   Lipid Profile: No  results for input(s): CHOL, HDL, LDLCALC, TRIG, CHOLHDL, LDLDIRECT in the last 72 hours. Thyroid Function Tests: No results for input(s): TSH, T4TOTAL, FREET4, T3FREE, THYROIDAB in the last 72 hours. Anemia Panel:  Recent Labs  03/24/17 1548  VITAMINB12 357  FOLATE 23.9  FERRITIN 52  TIBC 223*  IRON 19*  RETICCTPCT 1.8   Sepsis Labs: No results for input(s): PROCALCITON, LATICACIDVEN in the last 168 hours.  Recent Results (from the past 240 hour(s))  MRSA PCR Screening     Status: None   Collection Time: 03/24/17  5:42 PM  Result Value Ref Range Status   MRSA by PCR NEGATIVE NEGATIVE Final    Comment:        The GeneXpert MRSA Assay (FDA approved for NASAL specimens only), is one component of a comprehensive MRSA colonization surveillance program. It is not intended to diagnose MRSA infection nor to guide or monitor treatment for MRSA  infections.          Radiology Studies: Ct Abdomen Pelvis Wo Contrast  Result Date: 03/24/2017 CLINICAL DATA:  75 year old male with a history of AAA, kidney transplant about 21 years ago, hypertension who presents with rectal bleeding. He states it started last night about 6 PM. He's had multiple episodes of bloody stool since that time. He states he is mostly passing just blood without any stool. He's been feeling lightheaded and had 2 near-syncopal episodes this morning. He denies any abdominal pain. No nausea or vomiting. No fevers. No prior episodes of similar symptoms. He's had a colonoscopy one time in the past but he says was normal but this was done in [place]. EXAM: CT ABDOMEN AND PELVIS WITHOUT CONTRAST TECHNIQUE: Multidetector CT imaging of the abdomen and pelvis was performed following the standard protocol without IV contrast. COMPARISON:  None. FINDINGS: Lower chest: Mild right basilar atelectasis. Hepatobiliary: No focal hepatic mass. No intrahepatic or extrahepatic biliary ductal dilatation. High attenuation material along the deep tendon aspect of the gallbladder likely reflecting gallbladder sludge versus tiny cholelithiasis. Pancreas: Unremarkable. No pancreatic ductal dilatation or surrounding inflammatory changes. Spleen: Normal in size without focal abnormality. Adrenals/Urinary Tract: Normal adrenal glands. Severely atrophic bilateral native kidneys. Multiple bilateral hypodense, fluid attenuating bilateral renal masses most consistent with cysts. Along the inferior pole of the right kidney there is a 15 mm hyperdense right renal mass measuring 81 Hounsfield units most consistent with a hemorrhagic cyst. Large hypodense 3.5 x 4.4 cm midpole right renal mass with high attenuation anterior layering dependently likely reflecting a proteinaceous cyst. Transplanted kidney is noted in the right iliac fossa. No obstructive uropathy. No urolithiasis. Normal bladder. Stomach/Bowel: No  bowel dilatation to suggest bowel obstruction. Diverticulosis of the descending colon and sigmoid colon with mild pericolonic inflammatory changes at the descending colon- sigmoid colon junction concerning for mild diverticulitis. No peridiverticular fluid collection to suggest an abscess. No pneumatosis, pneumoperitoneum or portal venous gas. No abdominal or pelvic free fluid. Vascular/Lymphatic: Abdominal aortic atherosclerosis. Infrarenal abdominal aortic aneurysm measuring 3.5 x 3.5 cm. No lymphadenopathy. Reproductive: Prostate is unremarkable. Other: Fat containing supraumbilical hernia. No abdominopelvic ascites. Musculoskeletal: No acute osseous abnormality. No lytic or sclerotic osseous lesion. 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 Grade 1 anterolisthesis of L4 on L5 secondary to facet disease. Mild osteoarthritis of bilateral hips. IMPRESSION: 1. Mild acute diverticulitis of the descending colon-sigmoid colon junction. No peridiverticular fluid collection to suggest an abscess. 2. 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  3. 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 4. Infrarenal abdominal aortic aneurysm measured 3.5 x 3.5 cm. Recommend followup by ultrasound in 2 years. This recommendation follows ACR consensus guidelines: White Paper of the ACR Incidental Findings Committee II on Vascular Findings. J Am Coll Radiol 2013; 10:789-794. 5.  Aortic aneurysm NOS (ICD10-I71.9) 6.  Aortic Atherosclerosis (ICD10-170.0) Electronically Signed   By: Kathreen Devoid   On: 03/24/2017 16:39   US Renal Transplant W/doppler  Result Date: 03/24/2017 CLINICAL DATA:  Acute renal failure, hypertension, prior kidney transplant EXAM: ULTRASOUND OF RENAL TRANSPLANT WITH RENAL DOPPLER ULTRASOUND TECHNIQUE: Ultrasound examination of the renal transplant was performed with gray-scale, color and duplex  doppler evaluation. COMPARISON:  CT abdomen and pelvis 03/24/2017 FINDINGS: Transplant kidney location: RIGHT iliac fossa Transplant Kidney: Length: 12.4 cm. Normal in size and parenchymal echogenicity. No evidence of mass or hydronephrosis. No peri-transplant fluid collection seen. Color flow in the main renal artery:  Present Color flow in the main renal vein:  Present Duplex Doppler Evaluation: Main Renal Artery Resistive Index: 0.74 Venous waveform in main renal vein:  Present Intrarenal resistive index in upper pole:  0.64 (normal 0.6-0.8; equivocal 0.8-0.9; abnormal >= 0.9) Intrarenal resistive index in lower pole: 0.64 (normal 0.6-0.8; equivocal 0.8-0.9; abnormal >= 0.9) Bladder: Normal appearance. Transplant ureter jet was not visualized. Other findings:  None. IMPRESSION: Unremarkable transplant kidney at RIGHT iliac fossa. Electronically Signed   By: Lavonia Dana M.D.   On: 03/24/2017 18:35   Dg Chest Port 1 View  Result Date: 03/24/2017 CLINICAL DATA:  Acute onset of generalized weakness. Near syncope. Leukocytosis. Initial encounter. EXAM: PORTABLE CHEST 1 VIEW COMPARISON:  None. FINDINGS: The lungs are well-aerated and clear. There is no evidence of focal opacification, pleural effusion or pneumothorax. The cardiomediastinal silhouette is within normal limits. No acute osseous abnormalities are seen. IMPRESSION: No acute cardiopulmonary process seen. Electronically Signed   By: Garald Balding M.D.   On: 03/24/2017 18:36        Scheduled Meds: . allopurinol  100 mg Oral BID  . atorvastatin  20 mg Oral Daily  . calcium carbonate  2 tablet Oral Q breakfast  . calcium carbonate  1 tablet Oral QHS  . cyanocobalamin  1,000 mcg Intramuscular Daily  . cycloSPORINE modified  100 mg Oral BID  . furosemide  20 mg Intravenous Once  . metoprolol tartrate  50 mg Oral BID  . predniSONE  5 mg Oral Daily   Continuous Infusions: . sodium chloride Stopped (03/25/17 0955)  . sodium chloride    .  ciprofloxacin Stopped (03/25/17 0722)  . metronidazole 500 mg (03/25/17 1005)     LOS: 1 day    Time spent: 41 mins    Lillan Mccreadie, MD [facility] Pager [phone]  If 7PM-7AM, please contact night-coverage [email or web address] Password [email or web address] 03/25/2017, 10:23 AM

## 2017-03-26 LAB — BASIC METABOLIC PANEL
ANION GAP: 10 (ref 5–15)
BUN: 84 mg/dL — ABNORMAL HIGH (ref 6–20)
CHLORIDE: 105 mmol/L (ref 101–111)
CO2: 20 mmol/L — AB (ref 22–32)
Calcium: 8.2 mg/dL — ABNORMAL LOW (ref 8.9–10.3)
Creatinine, Ser: 3.02 mg/dL — ABNORMAL HIGH (ref 0.61–1.24)
GFR calc non Af Amer: 19 mL/min — ABNORMAL LOW (ref >60)
GFR, EST AFRICAN AMERICAN: 22 mL/min — AB (ref >60)
Glucose, Bld: 129 mg/dL — ABNORMAL HIGH (ref 65–99)
POTASSIUM: 4.9 mmol/L (ref 3.5–5.1)
SODIUM: 135 mmol/L (ref 135–145)

## 2017-03-26 LAB — TYPE AND SCREEN
ABO/RH(D): O NEG
Antibody Screen: NEGATIVE
UNIT DIVISION: 0
Unit division: 0

## 2017-03-26 LAB — BPAM RBC
Blood Product Expiration Date: 201810302359
Blood Product Expiration Date: 201810302359
ISSUE DATE / TIME: 201810071111
ISSUE DATE / TIME: 201810071527
UNIT TYPE AND RH: 9500
Unit Type and Rh: 9500

## 2017-03-26 LAB — CBC WITH DIFFERENTIAL/PLATELET
BASOS ABS: 0 10*3/uL (ref 0.0–0.1)
BASOS PCT: 0 %
EOS ABS: 0.1 10*3/uL (ref 0.0–0.7)
Eosinophils Relative: 1 %
HEMATOCRIT: 27.6 % — AB (ref 39.0–52.0)
HEMOGLOBIN: 9.3 g/dL — AB (ref 13.0–17.0)
Lymphocytes Relative: 17 %
Lymphs Abs: 1.8 10*3/uL (ref 0.7–4.0)
MCH: 30.2 pg (ref 26.0–34.0)
MCHC: 33.7 g/dL (ref 30.0–36.0)
MCV: 89.6 fL (ref 78.0–100.0)
Monocytes Absolute: 0.6 10*3/uL (ref 0.1–1.0)
Monocytes Relative: 6 %
NEUTROS ABS: 8 10*3/uL — AB (ref 1.7–7.7)
NEUTROS PCT: 76 %
Platelets: 217 10*3/uL (ref 150–400)
RBC: 3.08 MIL/uL — AB (ref 4.22–5.81)
RDW: 15.8 % — ABNORMAL HIGH (ref 11.5–15.5)
WBC: 10.6 10*3/uL — AB (ref 4.0–10.5)

## 2017-03-26 LAB — MAGNESIUM: Magnesium: 2.6 mg/dL — ABNORMAL HIGH (ref 1.7–2.4)

## 2017-03-26 LAB — HEMOGLOBIN AND HEMATOCRIT, BLOOD
HEMATOCRIT: 27.3 % — AB (ref 39.0–52.0)
HEMOGLOBIN: 9.1 g/dL — AB (ref 13.0–17.0)

## 2017-03-26 LAB — GLUCOSE, CAPILLARY: GLUCOSE-CAPILLARY: 128 mg/dL — AB (ref 65–99)

## 2017-03-26 MED ORDER — METOPROLOL TARTRATE 50 MG PO TABS
50.0000 mg | ORAL_TABLET | Freq: Two times a day (BID) | ORAL | Status: DC
  Administered 2017-03-26 – 2017-03-28 (×5): 50 mg via ORAL
  Filled 2017-03-26 (×2): qty 1
  Filled 2017-03-26 (×3): qty 2

## 2017-03-26 MED ORDER — METOPROLOL TARTRATE 25 MG PO TABS: 50.0000 mg | ORAL_TABLET | Freq: Two times a day (BID) | ORAL | Status: DC

## 2017-03-26 MED ORDER — HYDRALAZINE HCL 25 MG PO TABS
25.0000 mg | ORAL_TABLET | Freq: Three times a day (TID) | ORAL | Status: DC
  Administered 2017-03-26 – 2017-03-28 (×6): 25 mg via ORAL
  Filled 2017-03-26 (×6): qty 1

## 2017-03-26 MED ORDER — METOPROLOL TARTRATE 25 MG PO TABS: 25.0000 mg | ORAL_TABLET | Freq: Two times a day (BID) | ORAL | Status: DC

## 2017-03-26 NOTE — Evaluation (Signed)
Physical Therapy Evaluation Patient Details Name: Caleb Hicks MRN: [id number] DOB: 18-Feb-1941 Today's Date: 03/26/2017   History of Present Illness  Pt is a 76 y/o M history of chronic kidney disease stage IV, [name], hypertension, hyperlipidemia presenting to the ED with rectal bleeding 1 day. CT abdomen and pelvis also consistent with acute mild diverticulitis. Patient admitted to the step down unit. GI consulted.  Clinical Impression  The patient ambulated x 120' with RW. aparantly fell on left knee which patient reports is sore. Daughter present and plans to provide care for patient at [facility]. Pt admitted with above diagnosis. Pt currently with functional limitations due to the deficits listed below (see PT Problem List).  Pt will benefit from skilled PT to increase their independence and safety with mobility to allow discharge to the venue listed below.       Follow Up Recommendations Home health PT    Equipment Recommendations  Rolling walker with 5" wheels    Recommendations for Other Services       Precautions / Restrictions Precautions Precautions: Fall Restrictions Weight Bearing Restrictions: No      Mobility  Bed Mobility Overal bed mobility: Needs Assistance Bed Mobility: Supine to Sit     Supine to sit: Min guard     General bed mobility comments: close guard for safety  Transfers Overall transfer level: Needs assistance Equipment used: Rolling walker (2 wheeled) Transfers: Sit to/from Stand Sit to Stand: Min assist         General transfer comment: assist to rise and steady; verbal cues for hand placement during rise/descent   Ambulation/Gait Ambulation/Gait assistance: Min assist Ambulation Distance (Feet): 120 Feet Assistive device: Rolling walker (2 wheeled) Gait Pattern/deviations: Step-through pattern;Antalgic     General Gait Details: Cues for safety, sats 100% RA.  Stairs            Wheelchair Mobility    Modified  Rankin (Stroke Patients Only)       Balance Overall balance assessment: Needs assistance Sitting-balance support: Feet supported Sitting balance-Leahy Scale: Fair     Standing balance support: Bilateral upper extremity supported Standing balance-Leahy Scale: Poor Standing balance comment: reliant on bil UEs during ambulation and external support from therapist                              Pertinent Vitals/Pain Pain Assessment: Faces Faces Pain Scale: Hurts a little bit Pain Location: left knee Pain Descriptors / Indicators: Aching;Sore Pain Intervention(s): Monitored during session;Ice applied    Home Living Family/patient expects to be discharged to:: Private residence Living Arrangements: Alone Available Help at Discharge: Family Type of Home: Other(Comment) (condo (lives on 3rd floor)) Home Access: Elevator     Home Layout: One level Home Equipment: Other (comment) (walking stick ) Additional Comments: Pt's daughter present and reporting option of Pt returning to her home initially, her home has 2 STE and walk-in shower     Prior Function Level of Independence: Independent               Hand Dominance        Extremity/Trunk Assessment   Upper Extremity Assessment Upper Extremity Assessment: Defer to OT evaluation    Lower Extremity Assessment Lower Extremity Assessment: Generalized weakness;LLE deficits/detail LLE Deficits / Details: decreased knee flexion with c/o pain, noted antalgic    Cervical / Trunk Assessment Cervical / Trunk Assessment: Normal  Communication   Communication: No difficulties  Cognition Arousal/Alertness: Awake/alert Behavior During Therapy: WFL for tasks assessed/performed Overall Cognitive Status: Within Functional Limits for tasks assessed                                 General Comments: Per daughter report Pt has been mildly confused      General Comments General comments (skin integrity,  edema, etc.): BP monitored during session: 171/65 in supine at start of session, 139/76 upon initial standing at EOB, 182/70 after ambulation     Exercises     Assessment/Plan    PT Assessment Patient needs continued PT services  PT Problem List Decreased strength;Decreased activity tolerance;Decreased balance;Decreased mobility;Decreased knowledge of precautions;Decreased knowledge of use of DME;Decreased safety awareness;Decreased cognition;Pain       PT Treatment Interventions DME instruction;Gait training;Functional mobility training;Therapeutic activities;Therapeutic exercise;Patient/family education;Stair training    PT Goals (Current goals can be found in the Care Plan section)  Acute Rehab PT Goals Patient Stated Goal: return home PT Goal Formulation: With patient/family Time For Goal Achievement: 04/09/17 Potential to Achieve Goals: Good    Frequency     Barriers to discharge        Co-evaluation PT/OT/SLP Co-Evaluation/Treatment: Yes Reason for Co-Treatment: For patient/therapist safety PT goals addressed during session: Mobility/safety with mobility OT goals addressed during session: ADL's and self-care       AM-PAC PT "6 Clicks" Daily Activity  Outcome Measure Difficulty turning over in bed (including adjusting bedclothes, sheets and blankets)?: A Little Difficulty moving from lying on back to sitting on the side of the bed? : A Little Difficulty sitting down on and standing up from a chair with arms (e.g., wheelchair, bedside commode, etc,.)?: Unable Help needed moving to and from a bed to chair (including a wheelchair)?: Total Help needed walking in hospital room?: Total Help needed climbing 3-5 steps with a railing? : Total 6 Click Score: 10    End of Session Equipment Utilized During Treatment: Gait belt Activity Tolerance: Patient tolerated treatment well Patient left: in chair;with call bell/phone within reach;with chair alarm set;with family/visitor  present Nurse Communication: Mobility status PT Visit Diagnosis: Unsteadiness on feet (R26.81)    Time: 7253-6644 PT Time Calculation (min) (ACUTE ONLY): 26 min   Charges:   PT Evaluation $PT Eval Low Complexity: 1 Low     PT G CodesTresa Endo PT [phone]   [name] 03/26/2017, 12:14 PM

## 2017-03-26 NOTE — Progress Notes (Signed)
Pt Hypertensive BP 190s/70s MD notified.

## 2017-03-26 NOTE — Evaluation (Signed)
Occupational Therapy Evaluation Patient Details Name: Caleb Hicks MRN: [id number] DOB: [birth year] Today's Date: 03/26/2017    History of Present Illness Pt is a 76 y/o M history of chronic kidney disease stage IV, [name], hypertension, hyperlipidemia presenting to the ED with rectal bleeding 1 day. CT abdomen and pelvis also consistent with acute mild diverticulitis. Patient admitted to the step down unit. GI consulted.   Clinical Impression   This 76 y/o M presents with the above. Pt lives alone and at baseline is independent with ADLs and functional mobility. Pt completed functional mobility at RW level with overall MinA this session, requires MinGuard for UB ADLs, MaxA for LB ADLs. Pt demonstrates limitations including decreased activity tolerance and generalized weakness. Pt will benefit from continued acute OT services and recommend further OT services after discharge to maximize Pt's safety and independence with ADLs and functional mobility.     Follow Up Recommendations  Home health OT;Supervision/Assistance - 24 hour;Other (comment) (Pt/Pt's daughter requesting return home vs SNF)    Equipment Recommendations  Other (comment) (to be further assessed; may benefit from 3:1 for use in shower )           Precautions / Restrictions Precautions Precautions: Fall Restrictions Weight Bearing Restrictions: No      Mobility Bed Mobility Overal bed mobility: Needs Assistance Bed Mobility: Supine to Sit     Supine to sit: Min guard     General bed mobility comments: close guard for safety  Transfers Overall transfer level: Needs assistance Equipment used: Rolling walker (2 wheeled) Transfers: Sit to/from Stand Sit to Stand: Min assist         General transfer comment: assist to rise and steady; verbal cues for hand placement during rise/descent     Balance Overall balance assessment: Needs assistance Sitting-balance support: Feet supported Sitting  balance-Leahy Scale: Fair     Standing balance support: Bilateral upper extremity supported Standing balance-Leahy Scale: Poor Standing balance comment: reliant on bil UEs during ambulation and external support from therapist                            ADL either performed or assessed with clinical judgement   ADL Overall ADL's : Needs assistance/impaired Eating/Feeding: Set up;Sitting   Grooming: Oral care;Wash/dry hands;Set up;Sitting   Upper Body Bathing: Min guard;Sitting   Lower Body Bathing: Minimal assistance;Sit to/from stand   Upper Body Dressing : Min guard;Sitting   Lower Body Dressing: Maximal assistance;Sit to/from stand   Toilet Transfer: Minimal assistance;Ambulation;RW Toilet Transfer Details (indicate cue type and reason): simulated with transfer to chair  Toileting- Clothing Manipulation and Hygiene: Minimal assistance;Sit to/from stand       Functional mobility during ADLs: Minimal assistance;Rolling walker                           Pertinent Vitals/Pain Pain Assessment: Faces Faces Pain Scale: Hurts a little bit Pain Location: R knee  Pain Descriptors / Indicators: Aching;Sore Pain Intervention(s): Limited activity within patient's tolerance;Monitored during session          Extremity/Trunk Assessment Upper Extremity Assessment Upper Extremity Assessment: Generalized weakness   Lower Extremity Assessment Lower Extremity Assessment: Defer to PT evaluation       Communication Communication Communication: No difficulties   Cognition Arousal/Alertness: Awake/alert Behavior During Therapy: WFL for tasks assessed/performed Overall Cognitive Status: Within Functional Limits for tasks assessed  General Comments: Per daughter report Pt has been mildly confused   General Comments  BP monitored during session: 171/65 in supine at start of session, 139/76 upon initial standing at EOB,  182/70 after ambulation                Home Living Family/patient expects to be discharged to:: Private residence Living Arrangements: Alone Available Help at Discharge: Family Type of Home: Other(Comment) (condo (lives on 3rd floor)) Home Access: Elevator     Home Layout: One level     Bathroom Shower/Tub: Occupational psychologist: Standard     Home Equipment: Other (comment) (walking stick )   Additional Comments: Pt's daughter present and reporting option of Pt returning to her home initially, her home has 2 STE and walk-in shower       Prior Functioning/Environment Level of Independence: Independent                 OT Problem List: Decreased strength;Impaired balance (sitting and/or standing);Decreased cognition;Decreased activity tolerance;Decreased knowledge of use of DME or AE      OT Treatment/Interventions: Self-care/ADL training;DME and/or AE instruction;Therapeutic activities;Balance training;Therapeutic exercise;Patient/family education;Energy conservation    OT Goals(Current goals can be found in the care plan section) Acute Rehab OT Goals Patient Stated Goal: return home OT Goal Formulation: With patient/family Time For Goal Achievement: 04/09/17 Potential to Achieve Goals: Good  OT Frequency: Min 2X/week               Co-evaluation PT/OT/SLP Co-Evaluation/Treatment: Yes Reason for Co-Treatment: For patient/therapist safety;To address functional/ADL transfers PT goals addressed during session: Mobility/safety with mobility OT goals addressed during session: ADL's and self-care      AM-PAC PT "6 Clicks" Daily Activity     Outcome Measure Help from another person eating meals?: None Help from another person taking care of personal grooming?: A Little Help from another person toileting, which includes using toliet, bedpan, or urinal?: A Lot Help from another person bathing (including washing, rinsing, drying)?: A Lot Help from  another person to put on and taking off regular upper body clothing?: A Little Help from another person to put on and taking off regular lower body clothing?: A Lot 6 Click Score: 16   End of Session Equipment Utilized During Treatment: Gait belt;Rolling walker Nurse Communication: Mobility status  Activity Tolerance: Patient tolerated treatment well Patient left: in chair;with call bell/phone within reach;with family/visitor present  OT Visit Diagnosis: Unsteadiness on feet (R26.81);Muscle weakness (generalized) (M62.81)                Time: 0865-7846 OT Time Calculation (min): 27 min Charges:  OT General Charges $OT Visit: 1 Visit OT Evaluation $OT Eval Moderate Complexity: 1 Mod G-Codes:     Lou Cal, OT Pager [phone] 03/26/2017  Raymondo Band 03/26/2017, 10:32 AM

## 2017-03-26 NOTE — Care Management Note (Signed)
Case Management Note  Patient Details  Name: Caleb Hicks MRN: [id number] Date of Birth: 04/30/1941  Subjective/Objective:                  76 year old gentleman history of chronic kidney disease stage IV, [name], hypertension, hyperlipidemia presenting to the ED with rectal bleeding 1 day. CT abdomen and pelvis also consistent with acute mild diverticulitis.  Action/Plan: Date:  March 26, 2017 Chart reviewed for concurrent status and case management needs.  Will continue to follow patient progress.  Discharge Planning: following for needs  Expected discharge date: [id number]  Velva Harman, BSN, [place], [place]   Expected Discharge Date:                  Expected Discharge Plan:  Home/Self Care  In-House Referral:     Discharge planning Services  CM Consult  Post Acute Care Choice:    Choice offered to:     DME Arranged:    DME Agency:     HH Arranged:    HH Agency:     Status of Service:  In process, will continue to follow  If discussed at Long Length of Stay Meetings, dates discussed:    Additional Comments:  Leeroy Cha, RN 03/26/2017, 9:38 AM

## 2017-03-26 NOTE — Progress Notes (Signed)
PROGRESS NOTE    Rowe Warman  LKG:[id number] DOB: 26-Jul-1940 DOA: 03/24/2017 PCP: Patient, No Pcp Per    Brief Narrative:  Patient is a 76 year old gentleman history of chronic kidney disease stage IV, [name], hypertension, hyperlipidemia presenting to the ED with rectal bleeding 1 day. CT abdomen and pelvis also consistent with acute mild diverticulitis. Patient admitted to the step down unit. GI consulted. Patient placed empirically on IV ciprofloxacin and IV Flagyl.   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 CT abdomen and pelvis with mild acute diverticulitis of the descending colon-sigmoid colon junction. Negative for abscess. GI bleed seems to be slowing down. Patient with 3 bloody bowel movements overnight per patient. Patient status post 2 units packed red blood cells hemoglobin currently at 9.3 from 7.3 from 9.4 and admission. Lightheadedness improved. Serial CBCs. If patient with continued bleeding will need a bleeding scan and possible IR guided embolization per GI recommendations. Diet has been advanced to a full liquid diet per GI. GI following.  #2 acute diverticulitis Per CT abdomen and pelvis. Patient denies any abdominal pain. Patient still with some bloody bowel movements. Continue empiric IV ciprofloxacin and IV Flagyl and treat for total of 10 days. Patient will need outpatient follow-up with GI for colonoscopy in about 4-6 weeks.  GI following. Continue current full liquid diet.   #3 acute blood loss anemia/iron deficiency anemia/low B-12 levels Secondary to problem #1. Hemoglobin currently at 9.3 from 7.3 from 9.4. Patient status post 2 units packed red blood cells. Anemia panel consistent with iron deficiency anemia with low B-12 levels. Iron of 19, ferritin of 52, vitamin B-12 levels of 357. Patient will likely benefit from IV iron at some point prior to discharge. Patient may need oral iron supplementation on discharge. Continue vitamin B-12 1000 MCG's IM daily 1 week, and then weekly 1 month, and then monthly. Outpatient follow-up.  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 Renal transplant ultrasound with Dopplers unremarkable. Urine studies pending. Renal function slowly trending down with hydration. Cyclosporine level pending. Decrease IV fluid rate to 50 mL per hour. Continue cyclosporine, prednisone. Outpatient follow-up.  #5 hyperuricemia Continue home regimen of allopurinol.  #6 dehydration Decrease IV fluid rate to 50 mL per hour.   #7 AAA Stable. 3.5 x 3.5 cm by CT abdomen and pelvis. Follow-up ultrasound in 2 years. Outpatient follow-up.  #8 hyperlipidemia Continue statin.  #9 leukocytosis Chest x-ray negative. CT abdomen and pelvis consistent with a mild acute diverticulitis. Urinalysis pending. Continue empiric IV ciprofloxacin and IV Flagyl.  #10 hypertension Blood pressure elevated today. Increase beta blocker back to home regimen of 50 mg twice daily. Continue to hold ARB and diuretics secondary to problem #4. Start hydralazine 25 mg by mouth 3 times a day.   #11 sinus tachycardia Likely secondary  to problem #1. Improved.   DVT prophylaxis: SCDs Code Status: Full Family Communication: Updated patient and daughter at bedside. Disposition Plan: Remain the step down unit  today.   Consultants:   Gastroenterology: Tana Felts 03/25/2017  Procedures:   CT abdomen and pelvis 03/24/2017  Chest x-ray 03/24/2017  Renal transplant Ultrasound with Dopplers 03/24/2017  Transfuse 2 units packed red blood cells 03/25/2017  Antimicrobials:   IV ciprofloxacin 03/24/2017  IV Flagyl 03/24/2017   Subjective: Patient states feeling better today. Per daughter patient with more strength today. Per daughter patient with some fogginess this morning. Patient states had some bloody bowel movements approximate 3 of them last night. No bloody bowel movement this morning. No chest pain. No shortness of breath. Tolerating current diet.  Objective: Vitals:   03/26/17 0354 03/26/17 0400 03/26/17 0800 03/26/17 1000  BP:  (!) 181/57 (!) 171/65 (!) 144/57  Pulse:  (!) 59 75 68  Resp:  13 19 15   Temp: 98 F (36.7 C)  98 F (36.7 C)   TempSrc: Oral  Oral   SpO2:  98% 99% 100%  Weight:      Height:        Intake/Output Summary (Last 24 hours) at 03/26/17 1045 Last data filed at 03/26/17 1000  Gross per 24 hour  Intake          2189.67 ml  Output                0 ml  Net          2189.67 ml   Filed Weights   03/24/17 1236 03/24/17 1700 03/25/17 0600  Weight: 76.2 kg (168 lb) 72.1 kg (158 lb 15.2 oz) 72 kg (158 lb 11.7 oz)    Examination:  General exam: NAD Respiratory system: Clear to auscultation Bilaterally. No crackles, no rhonchi, no wheezing. Cardiovascular system: S1 & S2 heard, RRR. No JVD, murmurs, rubs, gallops or clicks. No pedal edema. Gastrointestinal system: Abdomen is soft, nontender, nondistended, hyperactive bowel sounds. No hepatosplenomegaly.  Central nervous system: Alert and oriented. No focal neurological deficits. Extremities: Symmetric 5 x 5 power. Skin: No rashes, lesions or ulcers Psychiatry: Judgement and insight appear normal. Mood & affect appropriate.     Data Reviewed: I have personally reviewed following labs and imaging  studies  CBC:  Recent Labs Lab 03/24/17 1348 03/24/17 1548 03/24/17 2331 03/25/17 0706 03/25/17 2054 03/26/17 0347  WBC 12.9* 12.9* 9.9 9.5  --  10.6*  NEUTROABS 11.1*  --   --   --   --  8.0*  HGB 9.3* 9.4* 7.3* 7.3* 9.2* 9.3*  HCT 28.2* 29.2* 22.6* 22.1* 27.2* 27.6*  MCV 91.6 92.1 91.5 91.7  --  89.6  PLT 215 264 223 220  --  235   Basic Metabolic Panel:  Recent Labs Lab 03/24/17 1348 03/25/17 0706 03/26/17 0347  NA 136 135 135  K 4.6 4.8 4.9  CL 100* 102 105  CO2 22 22 20*  GLUCOSE 195* 135* 129*  BUN 95* 89* 84*  CREATININE 3.35* 3.12* 3.02*  CALCIUM 8.8* 8.3* 8.2*  MG  --  1.5* 2.6*   GFR: Estimated Creatinine Clearance: 19.5 mL/min (A) (by C-G formula based on SCr of 3.02 mg/dL (H)). Liver Function Tests:  Recent Labs Lab 03/25/17 0706  AST 12*  ALT 11*  ALKPHOS 52  BILITOT 0.4  PROT 4.9*  ALBUMIN 2.3*   No results for input(s): LIPASE, AMYLASE in the last 168 hours. No results for input(s):  AMMONIA in the last 168 hours. Coagulation Profile: No results for input(s): INR, PROTIME in the last 168 hours. Cardiac Enzymes: No results for input(s): CKTOTAL, CKMB, CKMBINDEX, TROPONINI in the last 168 hours. BNP (last 3 results) No results for input(s): PROBNP in the last 8760 hours. HbA1C: No results for input(s): HGBA1C in the last 72 hours. CBG:  Recent Labs Lab 03/25/17 0813 03/26/17 0813  GLUCAP 133* 128*   Lipid Profile: No results for input(s): CHOL, HDL, LDLCALC, TRIG, CHOLHDL, LDLDIRECT in the last 72 hours. Thyroid Function Tests: No results for input(s): TSH, T4TOTAL, FREET4, T3FREE, THYROIDAB in the last 72 hours. Anemia Panel:  Recent Labs  03/24/17 1548  VITAMINB12 357  FOLATE 23.9  FERRITIN 52  TIBC 223*  IRON 19*  RETICCTPCT 1.8   Sepsis Labs: No results for input(s): PROCALCITON, LATICACIDVEN in the last 168 hours.  Recent Results (from the past 240 hour(s))  MRSA PCR Screening     Status: None   Collection Time:  03/24/17  5:42 PM  Result Value Ref Range Status   MRSA by PCR NEGATIVE NEGATIVE Final    Comment:        The GeneXpert MRSA Assay (FDA approved for NASAL specimens only), is one component of a comprehensive MRSA colonization surveillance program. It is not intended to diagnose MRSA infection nor to guide or monitor treatment for MRSA infections.          Radiology Studies: Ct Abdomen Pelvis Wo Contrast  Result Date: 03/24/2017 CLINICAL DATA:  76 year old male with a history of AAA, kidney transplant about 21 years ago, hypertension who presents with rectal bleeding. He states it started last night about 6 PM. He's had multiple episodes of bloody stool since that time. He states he is mostly passing just blood without any stool. He's been feeling lightheaded and had 2 near-syncopal episodes this morning. He denies any abdominal pain. No nausea or vomiting. No fevers. No prior episodes of similar symptoms. He's had a colonoscopy one time in the past but he says was normal but this was done in [place]. EXAM: CT ABDOMEN AND PELVIS WITHOUT CONTRAST TECHNIQUE: Multidetector CT imaging of the abdomen and pelvis was performed following the standard protocol without IV contrast. COMPARISON:  None. FINDINGS: Lower chest: Mild right basilar atelectasis. Hepatobiliary: No focal hepatic mass. No intrahepatic or extrahepatic biliary ductal dilatation. High attenuation material along the deep tendon aspect of the gallbladder likely reflecting gallbladder sludge versus tiny cholelithiasis. Pancreas: Unremarkable. No pancreatic ductal dilatation or surrounding inflammatory changes. Spleen: Normal in size without focal abnormality. Adrenals/Urinary Tract: Normal adrenal glands. Severely atrophic bilateral native kidneys. Multiple bilateral hypodense, fluid attenuating bilateral renal masses most consistent with cysts. Along the inferior pole of the right kidney there is a 15 mm hyperdense right renal  mass measuring 81 Hounsfield units most consistent with a hemorrhagic cyst. Large hypodense 3.5 x 4.4 cm midpole right renal mass with high attenuation anterior layering dependently likely reflecting a proteinaceous cyst. Transplanted kidney is noted in the right iliac fossa. No obstructive uropathy. No urolithiasis. Normal bladder. Stomach/Bowel: No bowel dilatation to suggest bowel obstruction. Diverticulosis of the descending colon and sigmoid colon with mild pericolonic inflammatory changes at the descending colon- sigmoid colon junction concerning for mild diverticulitis. No peridiverticular fluid collection to suggest an abscess. No pneumatosis, pneumoperitoneum or portal venous gas. No abdominal or pelvic free fluid. Vascular/Lymphatic: Abdominal aortic atherosclerosis. Infrarenal abdominal aortic aneurysm measuring 3.5 x 3.5 cm. No lymphadenopathy. Reproductive: Prostate is unremarkable. Other:  Fat containing supraumbilical hernia. No abdominopelvic ascites. Musculoskeletal: No acute osseous abnormality. No lytic or sclerotic osseous lesion. 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 Grade 1 anterolisthesis of L4 on L5 secondary to facet disease. Mild osteoarthritis of bilateral hips. IMPRESSION: 1. Mild acute diverticulitis of the descending colon-sigmoid colon junction. No peridiverticular fluid collection to suggest an abscess. 2. 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 3. 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 4. Infrarenal abdominal aortic aneurysm measured 3.5 x 3.5 cm. Recommend followup by ultrasound in 2 years. This recommendation follows ACR consensus guidelines: White Paper of the ACR Incidental Findings Committee II on Vascular Findings. J Am Coll Radiol 2013; 10:789-794. 5.  Aortic aneurysm NOS (ICD10-I71.9) 6.  Aortic Atherosclerosis (ICD10-170.0) Electronically Signed    By: Kathreen Devoid   On: 03/24/2017 16:39   US Renal Transplant W/doppler  Result Date: 03/24/2017 CLINICAL DATA:  Acute renal failure, hypertension, prior kidney transplant EXAM: ULTRASOUND OF RENAL TRANSPLANT WITH RENAL DOPPLER ULTRASOUND TECHNIQUE: Ultrasound examination of the renal transplant was performed with gray-scale, color and duplex doppler evaluation. COMPARISON:  CT abdomen and pelvis 03/24/2017 FINDINGS: Transplant kidney location: RIGHT iliac fossa Transplant Kidney: Length: 12.4 cm. Normal in size and parenchymal echogenicity. No evidence of mass or hydronephrosis. No peri-transplant fluid collection seen. Color flow in the main renal artery:  Present Color flow in the main renal vein:  Present Duplex Doppler Evaluation: Main Renal Artery Resistive Index: 0.74 Venous waveform in main renal vein:  Present Intrarenal resistive index in upper pole:  0.64 (normal 0.6-0.8; equivocal 0.8-0.9; abnormal >= 0.9) Intrarenal resistive index in lower pole: 0.64 (normal 0.6-0.8; equivocal 0.8-0.9; abnormal >= 0.9) Bladder: Normal appearance. Transplant ureter jet was not visualized. Other findings:  None. IMPRESSION: Unremarkable transplant kidney at RIGHT iliac fossa. Electronically Signed   By: Lavonia Dana M.D.   On: 03/24/2017 18:35   Dg Chest Port 1 View  Result Date: 03/24/2017 CLINICAL DATA:  Acute onset of generalized weakness. Near syncope. Leukocytosis. Initial encounter. EXAM: PORTABLE CHEST 1 VIEW COMPARISON:  None. FINDINGS: The lungs are well-aerated and clear. There is no evidence of focal opacification, pleural effusion or pneumothorax. The cardiomediastinal silhouette is within normal limits. No acute osseous abnormalities are seen. IMPRESSION: No acute cardiopulmonary process seen. Electronically Signed   By: Garald Balding M.D.   On: 03/24/2017 18:36        Scheduled Meds: . allopurinol  100 mg Oral BID  . atorvastatin  20 mg Oral Daily  . calcium carbonate  2 tablet Oral Q  breakfast  . calcium carbonate  1 tablet Oral QHS  . cyanocobalamin  1,000 mcg Intramuscular Daily  . cycloSPORINE modified  50 mg Oral QHS  . cycloSPORINE modified  75 mg Oral q morning - 10a  . hydrALAZINE  25 mg Oral Q8H  . metoprolol tartrate  50 mg Oral BID  . predniSONE  5 mg Oral Daily   Continuous Infusions: . sodium chloride 50 mL/hr at 03/26/17 1000  . ciprofloxacin 200 mg (03/26/17 1033)  . metronidazole Stopped (03/26/17 0346)     LOS: 2 days    Time spent: 8 mins    Emmajean Ratledge, MD [facility] Pager [phone]  If 7PM-7AM, please contact night-coverage [email or web address] Password [email or web address] 03/26/2017, 10:45 AM

## 2017-03-26 NOTE — Progress Notes (Signed)
Subjective: The patient was seen and examined at bedside. He reports 2 episodes of bloody bowel movement today. Denies abdominal pain and has been able to tolerate full liquid diet.  Objective: Vital signs in last 24 hours: Temp:  [97.5 F (36.4 C)-98.1 F (36.7 C)] 98 F (36.7 C) (10/08 0800) Pulse Rate:  [56-75] 65 (10/08 1100) Resp:  [13-22] 18 (10/08 1100) BP: (120-181)/(51-73) 139/59 (10/08 1100) SpO2:  [96 %-100 %] 100 % (10/08 1100) Weight change:  Last BM Date: 03/25/17  PE: Not in acute distress GENERAL: Mild pallor ABDOMEN: Soft, non-distended, non-tender, normoactive bowel sounds EXTREMITIES: No edema  Lab Results: Results for orders placed or performed during the hospital encounter of 03/24/17 (from the past 48 hour(s))  ABO/Rh     Status: None   Collection Time: 03/24/17  1:43 PM  Result Value Ref Range   ABO/RH(D) O NEG   Basic metabolic panel     Status: Abnormal   Collection Time: 03/24/17  1:48 PM  Result Value Ref Range   Sodium 136 135 - 145 mmol/L   Potassium 4.6 3.5 - 5.1 mmol/L   Chloride 100 (L) 101 - 111 mmol/L   CO2 22 22 - 32 mmol/L   Glucose, Bld 195 (H) 65 - 99 mg/dL   BUN 95 (H) 6 - 20 mg/dL   Creatinine, Ser 3.35 (H) 0.61 - 1.24 mg/dL   Calcium 8.8 (L) 8.9 - 10.3 mg/dL   GFR calc non Af Amer 16 (L) >60 mL/min   GFR calc Af Amer 19 (L) >60 mL/min    Comment: (NOTE) The eGFR has been calculated using the CKD EPI equation. This calculation has not been validated in all clinical situations. eGFR's persistently <60 mL/min signify possible Chronic Kidney Disease.    Anion gap 14 5 - 15  CBC with Differential     Status: Abnormal   Collection Time: 03/24/17  1:48 PM  Result Value Ref Range   WBC 12.9 (H) 4.0 - 10.5 K/uL   RBC 3.08 (L) 4.22 - 5.81 MIL/uL   Hemoglobin 9.3 (L) 13.0 - 17.0 g/dL   HCT 28.2 (L) 39.0 - 52.0 %   MCV 91.6 78.0 - 100.0 fL   MCH 30.2 26.0 - 34.0 pg   MCHC 33.0 30.0 - 36.0 g/dL   RDW 14.8 11.5 - 15.5 %    Platelets 215 150 - 400 K/uL    Comment: RESULT REPEATED AND VERIFIED SPECIMEN CHECKED FOR CLOTS PLATELET COUNT CONFIRMED BY SMEAR    Neutrophils Relative % 86 %   Lymphocytes Relative 7 %   Monocytes Relative 6 %   Eosinophils Relative 1 %   Basophils Relative 0 %   Neutro Abs 11.1 (H) 1.7 - 7.7 K/uL   Lymphs Abs 0.9 0.7 - 4.0 K/uL   Monocytes Absolute 0.8 0.1 - 1.0 K/uL   Eosinophils Absolute 0.1 0.0 - 0.7 K/uL   Basophils Absolute 0.0 0.0 - 0.1 K/uL   Smear Review MORPHOLOGY UNREMARKABLE   Type and screen [place]     Status: None   Collection Time: 03/24/17  1:51 PM  Result Value Ref Range   ABO/RH(D) O NEG    Antibody Screen NEG    Sample Expiration 03/27/2017    Unit Number [id number]    Blood Component Type RED CELLS,LR    Unit division 00    Status of Unit ISSUED,FINAL    Transfusion Status OK TO TRANSFUSE    Crossmatch Result Compatible  Unit Number [id number]    Blood Component Type RED CELLS,LR    Unit division 00    Status of Unit ISSUED,FINAL    Transfusion Status OK TO TRANSFUSE    Crossmatch Result Compatible   CBC     Status: Abnormal   Collection Time: 03/24/17  3:48 PM  Result Value Ref Range   WBC 12.9 (H) 4.0 - 10.5 K/uL   RBC 3.17 (L) 4.22 - 5.81 MIL/uL   Hemoglobin 9.4 (L) 13.0 - 17.0 g/dL   HCT 29.2 (L) 39.0 - 52.0 %   MCV 92.1 78.0 - 100.0 fL   MCH 29.7 26.0 - 34.0 pg   MCHC 32.2 30.0 - 36.0 g/dL   RDW 14.7 11.5 - 15.5 %   Platelets 264 150 - 400 K/uL  Vitamin B12     Status: None   Collection Time: 03/24/17  3:48 PM  Result Value Ref Range   Vitamin B-12 357 180 - 914 pg/mL    Comment: (NOTE) This assay is not validated for testing neonatal or myeloproliferative syndrome specimens for Vitamin B12 levels. Performed at [facility], [place] [address] [place], [place] [address]   Folate     Status: None   Collection Time: 03/24/17  3:48 PM  Result Value Ref Range   Folate 23.9 >5.9 ng/mL    Comment:  Performed at [facility], [place] [address] [place], [place] [address]  Iron and TIBC     Status: Abnormal   Collection Time: 03/24/17  3:48 PM  Result Value Ref Range   Iron 19 (L) 45 - 182 ug/dL   TIBC 223 (L) 250 - 450 ug/dL   Saturation Ratios 9 (L) 17.9 - 39.5 %   UIBC 204 ug/dL    Comment: Performed at [place] [address], [place], [place] [address]  Ferritin     Status: None   Collection Time: 03/24/17  3:48 PM  Result Value Ref Range   Ferritin 52 24 - 336 ng/mL    Comment: Performed at [facility] [address] [place], [place] [address]  Reticulocytes     Status: Abnormal   Collection Time: 03/24/17  3:48 PM  Result Value Ref Range   Retic Ct Pct 1.8 0.4 - 3.1 %   RBC. 3.17 (L) 4.22 - 5.81 MIL/uL   Retic Count, Absolute 57.1 19.0 - 186.0 K/uL  MRSA PCR Screening     Status: None   Collection Time: 03/24/17  5:42 PM  Result Value Ref Range   MRSA by PCR NEGATIVE NEGATIVE    Comment:        The GeneXpert MRSA Assay (FDA approved for NASAL specimens only), is one component of a comprehensive MRSA colonization surveillance program. It is not intended to diagnose MRSA infection nor to guide or monitor treatment for MRSA infections.   CBC     Status: Abnormal   Collection Time: 03/24/17 11:31 PM  Result Value Ref Range   WBC 9.9 4.0 - 10.5 K/uL   RBC 2.47 (L) 4.22 - 5.81 MIL/uL   Hemoglobin 7.3 (L) 13.0 - 17.0 g/dL    Comment: DELTA CHECK NOTED REPEATED TO VERIFY    HCT 22.6 (L) 39.0 - 52.0 %   MCV 91.5 78.0 - 100.0 fL   MCH 29.6 26.0 - 34.0 pg   MCHC 32.3 30.0 - 36.0 g/dL   RDW 14.6 11.5 - 15.5 %   Platelets 223 150 - 400  K/uL  Magnesium     Status: Abnormal   Collection Time: 03/25/17  7:06 AM  Result Value Ref Range   Magnesium 1.5 (L) 1.7 - 2.4 mg/dL  Comprehensive metabolic panel     Status: Abnormal   Collection Time: 03/25/17  7:06 AM  Result Value Ref Range   Sodium 135 135 - 145 mmol/L   Potassium 4.8 3.5 - 5.1  mmol/L   Chloride 102 101 - 111 mmol/L   CO2 22 22 - 32 mmol/L   Glucose, Bld 135 (H) 65 - 99 mg/dL   BUN 89 (H) 6 - 20 mg/dL   Creatinine, Ser 3.12 (H) 0.61 - 1.24 mg/dL   Calcium 8.3 (L) 8.9 - 10.3 mg/dL   Total Protein 4.9 (L) 6.5 - 8.1 g/dL   Albumin 2.3 (L) 3.5 - 5.0 g/dL   AST 12 (L) 15 - 41 U/L   ALT 11 (L) 17 - 63 U/L   Alkaline Phosphatase 52 38 - 126 U/L   Total Bilirubin 0.4 0.3 - 1.2 mg/dL   GFR calc non Af Amer 18 (L) >60 mL/min   GFR calc Af Amer 21 (L) >60 mL/min    Comment: (NOTE) The eGFR has been calculated using the CKD EPI equation. This calculation has not been validated in all clinical situations. eGFR's persistently <60 mL/min signify possible Chronic Kidney Disease.    Anion gap 11 5 - 15  CBC     Status: Abnormal   Collection Time: 03/25/17  7:06 AM  Result Value Ref Range   WBC 9.5 4.0 - 10.5 K/uL   RBC 2.41 (L) 4.22 - 5.81 MIL/uL   Hemoglobin 7.3 (L) 13.0 - 17.0 g/dL   HCT 22.1 (L) 39.0 - 52.0 %   MCV 91.7 78.0 - 100.0 fL   MCH 30.3 26.0 - 34.0 pg   MCHC 33.0 30.0 - 36.0 g/dL   RDW 14.9 11.5 - 15.5 %   Platelets 220 150 - 400 K/uL  Glucose, capillary     Status: Abnormal   Collection Time: 03/25/17  8:13 AM  Result Value Ref Range   Glucose-Capillary 133 (H) 65 - 99 mg/dL  Prepare RBC     Status: None   Collection Time: 03/25/17  8:57 AM  Result Value Ref Range   Order Confirmation ORDER PROCESSED BY BLOOD BANK   Hemoglobin and hematocrit, blood     Status: Abnormal   Collection Time: 03/25/17  8:54 PM  Result Value Ref Range   Hemoglobin 9.2 (L) 13.0 - 17.0 g/dL    Comment: DELTA CHECK NOTED POST TRANSFUSION SPECIMEN    HCT 27.2 (L) 39.0 - 52.0 %  Magnesium     Status: Abnormal   Collection Time: 03/26/17  3:47 AM  Result Value Ref Range   Magnesium 2.6 (H) 1.7 - 2.4 mg/dL  CBC with Differential/Platelet     Status: Abnormal   Collection Time: 03/26/17  3:47 AM  Result Value Ref Range   WBC 10.6 (H) 4.0 - 10.5 K/uL   RBC 3.08 (L)  4.22 - 5.81 MIL/uL   Hemoglobin 9.3 (L) 13.0 - 17.0 g/dL   HCT 27.6 (L) 39.0 - 52.0 %   MCV 89.6 78.0 - 100.0 fL   MCH 30.2 26.0 - 34.0 pg   MCHC 33.7 30.0 - 36.0 g/dL   RDW 15.8 (H) 11.5 - 15.5 %   Platelets 217 150 - 400 K/uL   Neutrophils Relative % 76 %   Neutro Abs 8.0 (H)  1.7 - 7.7 K/uL   Lymphocytes Relative 17 %   Lymphs Abs 1.8 0.7 - 4.0 K/uL   Monocytes Relative 6 %   Monocytes Absolute 0.6 0.1 - 1.0 K/uL   Eosinophils Relative 1 %   Eosinophils Absolute 0.1 0.0 - 0.7 K/uL   Basophils Relative 0 %   Basophils Absolute 0.0 0.0 - 0.1 K/uL  Basic metabolic panel     Status: Abnormal   Collection Time: 03/26/17  3:47 AM  Result Value Ref Range   Sodium 135 135 - 145 mmol/L   Potassium 4.9 3.5 - 5.1 mmol/L   Chloride 105 101 - 111 mmol/L   CO2 20 (L) 22 - 32 mmol/L   Glucose, Bld 129 (H) 65 - 99 mg/dL   BUN 84 (H) 6 - 20 mg/dL   Creatinine, Ser 3.02 (H) 0.61 - 1.24 mg/dL   Calcium 8.2 (L) 8.9 - 10.3 mg/dL   GFR calc non Af Amer 19 (L) >60 mL/min   GFR calc Af Amer 22 (L) >60 mL/min    Comment: (NOTE) The eGFR has been calculated using the CKD EPI equation. This calculation has not been validated in all clinical situations. eGFR's persistently <60 mL/min signify possible Chronic Kidney Disease.    Anion gap 10 5 - 15  Glucose, capillary     Status: Abnormal   Collection Time: 03/26/17  8:13 AM  Result Value Ref Range   Glucose-Capillary 128 (H) 65 - 99 mg/dL    Studies/Results: Ct Abdomen Pelvis Wo Contrast  Result Date: 03/24/2017 CLINICAL DATA:  76 year old male with a history of AAA, kidney transplant about 21 years ago, hypertension who presents with rectal bleeding. He states it started last night about 6 PM. He's had multiple episodes of bloody stool since that time. He states he is mostly passing just blood without any stool. He's been feeling lightheaded and had 2 near-syncopal episodes this morning. He denies any abdominal pain. No nausea or vomiting. No  fevers. No prior episodes of similar symptoms. He's had a colonoscopy one time in the past but he says was normal but this was done in [place]. EXAM: CT ABDOMEN AND PELVIS WITHOUT CONTRAST TECHNIQUE: Multidetector CT imaging of the abdomen and pelvis was performed following the standard protocol without IV contrast. COMPARISON:  None. FINDINGS: Lower chest: Mild right basilar atelectasis. Hepatobiliary: No focal hepatic mass. No intrahepatic or extrahepatic biliary ductal dilatation. High attenuation material along the deep tendon aspect of the gallbladder likely reflecting gallbladder sludge versus tiny cholelithiasis. Pancreas: Unremarkable. No pancreatic ductal dilatation or surrounding inflammatory changes. Spleen: Normal in size without focal abnormality. Adrenals/Urinary Tract: Normal adrenal glands. Severely atrophic bilateral native kidneys. Multiple bilateral hypodense, fluid attenuating bilateral renal masses most consistent with cysts. Along the inferior pole of the right kidney there is a 15 mm hyperdense right renal mass measuring 81 Hounsfield units most consistent with a hemorrhagic cyst. Large hypodense 3.5 x 4.4 cm midpole right renal mass with high attenuation anterior layering dependently likely reflecting a proteinaceous cyst. Transplanted kidney is noted in the right iliac fossa. No obstructive uropathy. No urolithiasis. Normal bladder. Stomach/Bowel: No bowel dilatation to suggest bowel obstruction. Diverticulosis of the descending colon and sigmoid colon with mild pericolonic inflammatory changes at the descending colon- sigmoid colon junction concerning for mild diverticulitis. No peridiverticular fluid collection to suggest an abscess. No pneumatosis, pneumoperitoneum or portal venous gas. No abdominal or pelvic free fluid. Vascular/Lymphatic: Abdominal aortic atherosclerosis. Infrarenal abdominal aortic aneurysm measuring 3.5 x 3.5  cm. No lymphadenopathy. Reproductive: Prostate is  unremarkable. Other: Fat containing supraumbilical hernia. No abdominopelvic ascites. Musculoskeletal: No acute osseous abnormality. No lytic or sclerotic osseous lesion. 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 Grade 1 anterolisthesis of L4 on L5 secondary to facet disease. Mild osteoarthritis of bilateral hips. IMPRESSION: 1. Mild acute diverticulitis of the descending colon-sigmoid colon junction. No peridiverticular fluid collection to suggest an abscess. 2. 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 3. 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 4. Infrarenal abdominal aortic aneurysm measured 3.5 x 3.5 cm. Recommend followup by ultrasound in 2 years. This recommendation follows ACR consensus guidelines: White Paper of the ACR Incidental Findings Committee II on Vascular Findings. J Am Coll Radiol 2013; 10:789-794. 5.  Aortic aneurysm NOS (ICD10-I71.9) 6.  Aortic Atherosclerosis (ICD10-170.0) Electronically Signed   By: Kathreen Devoid   On: 03/24/2017 16:39   US Renal Transplant W/doppler  Result Date: 03/24/2017 CLINICAL DATA:  Acute renal failure, hypertension, prior kidney transplant EXAM: ULTRASOUND OF RENAL TRANSPLANT WITH RENAL DOPPLER ULTRASOUND TECHNIQUE: Ultrasound examination of the renal transplant was performed with gray-scale, color and duplex doppler evaluation. COMPARISON:  CT abdomen and pelvis 03/24/2017 FINDINGS: Transplant kidney location: RIGHT iliac fossa Transplant Kidney: Length: 12.4 cm. Normal in size and parenchymal echogenicity. No evidence of mass or hydronephrosis. No peri-transplant fluid collection seen. Color flow in the main renal artery:  Present Color flow in the main renal vein:  Present Duplex Doppler Evaluation: Main Renal Artery Resistive Index: 0.74 Venous waveform in main renal vein:  Present Intrarenal resistive index in upper pole:  0.64 (normal  0.6-0.8; equivocal 0.8-0.9; abnormal >= 0.9) Intrarenal resistive index in lower pole: 0.64 (normal 0.6-0.8; equivocal 0.8-0.9; abnormal >= 0.9) Bladder: Normal appearance. Transplant ureter jet was not visualized. Other findings:  None. IMPRESSION: Unremarkable transplant kidney at RIGHT iliac fossa. Electronically Signed   By: Lavonia Dana M.D.   On: 03/24/2017 18:35   Dg Chest Port 1 View  Result Date: 03/24/2017 CLINICAL DATA:  Acute onset of generalized weakness. Near syncope. Leukocytosis. Initial encounter. EXAM: PORTABLE CHEST 1 VIEW COMPARISON:  None. FINDINGS: The lungs are well-aerated and clear. There is no evidence of focal opacification, pleural effusion or pneumothorax. The cardiomediastinal silhouette is within normal limits. No acute osseous abnormalities are seen. IMPRESSION: No acute cardiopulmonary process seen. Electronically Signed   By: Garald Balding M.D.   On: 03/24/2017 18:36    Medications: I have reviewed the patient's current medications.  Assessment: 1. Painless hematochezia likely diverticular in origin. 2. Mild acute sigmoid and descending colon diverticulitis.  Plan: 1. 2 units PRBC transfusion, hemoglobin stable between 9.2-9.3, hemodynamically stable. 2. On IV Cipro and IV Flagyl for diverticulitis, continue same for total of 10 days. As patient remains hemodynamically stable, episodes of rectal bleeding today morning, could be old blood, less likely ongoing bleeding. If hemodynamics deteriorate or patient continues to have multiple episodes of rectal bleeding, recommend stat bleeding scan and possible embolization. Would refrain from colonoscopy because of diverticulitis noted on CAT scan. Continue full liquid for now. H&H today afternoon. Discussed with patient, his daughter as well as patient's hospitalist Dr. Grandville Silos.   Ronnette Juniper 03/26/2017, 11:47 AM   Pager [phone] If no answer or after 5 PM call [phone]

## 2017-03-27 DIAGNOSIS — T8611 Kidney transplant rejection: Secondary | ICD-10-CM

## 2017-03-27 DIAGNOSIS — E785 Hyperlipidemia, unspecified: Secondary | ICD-10-CM

## 2017-03-27 DIAGNOSIS — K5793 Diverticulitis of intestine, part unspecified, without perforation or abscess with bleeding: Secondary | ICD-10-CM

## 2017-03-27 LAB — HEMOGLOBIN AND HEMATOCRIT, BLOOD
HEMATOCRIT: 25.7 % — AB (ref 39.0–52.0)
Hemoglobin: 8.5 g/dL — ABNORMAL LOW (ref 13.0–17.0)

## 2017-03-27 LAB — BASIC METABOLIC PANEL WITH GFR
Anion gap: 8 (ref 5–15)
BUN: 75 mg/dL — ABNORMAL HIGH (ref 6–20)
CO2: 19 mmol/L — ABNORMAL LOW (ref 22–32)
Calcium: 8.1 mg/dL — ABNORMAL LOW (ref 8.9–10.3)
Chloride: 108 mmol/L (ref 101–111)
Creatinine, Ser: 2.67 mg/dL — ABNORMAL HIGH (ref 0.61–1.24)
GFR calc Af Amer: 25 mL/min — ABNORMAL LOW
GFR calc non Af Amer: 22 mL/min — ABNORMAL LOW
Glucose, Bld: 128 mg/dL — ABNORMAL HIGH (ref 65–99)
Potassium: 5.4 mmol/L — ABNORMAL HIGH (ref 3.5–5.1)
Sodium: 135 mmol/L (ref 135–145)

## 2017-03-27 LAB — CBC WITH DIFFERENTIAL/PLATELET
Basophils Absolute: 0 K/uL (ref 0.0–0.1)
Basophils Relative: 0 %
Eosinophils Absolute: 0.2 K/uL (ref 0.0–0.7)
Eosinophils Relative: 2 %
HCT: 24.2 % — ABNORMAL LOW (ref 39.0–52.0)
Hemoglobin: 8.1 g/dL — ABNORMAL LOW (ref 13.0–17.0)
Lymphocytes Relative: 18 %
Lymphs Abs: 1.5 K/uL (ref 0.7–4.0)
MCH: 30.3 pg (ref 26.0–34.0)
MCHC: 33.5 g/dL (ref 30.0–36.0)
MCV: 90.6 fL (ref 78.0–100.0)
Monocytes Absolute: 0.5 K/uL (ref 0.1–1.0)
Monocytes Relative: 6 %
Neutro Abs: 6 K/uL (ref 1.7–7.7)
Neutrophils Relative %: 74 %
Platelets: 212 K/uL (ref 150–400)
RBC: 2.67 MIL/uL — ABNORMAL LOW (ref 4.22–5.81)
RDW: 15.8 % — ABNORMAL HIGH (ref 11.5–15.5)
WBC: 8.1 K/uL (ref 4.0–10.5)

## 2017-03-27 LAB — GLUCOSE, CAPILLARY: Glucose-Capillary: 123 mg/dL — ABNORMAL HIGH (ref 65–99)

## 2017-03-27 LAB — URINE CULTURE

## 2017-03-27 LAB — POTASSIUM: Potassium: 4.7 mmol/L (ref 3.5–5.1)

## 2017-03-27 MED ORDER — CIPROFLOXACIN HCL 500 MG PO TABS
500.0000 mg | ORAL_TABLET | Freq: Every day | ORAL | Status: DC
  Administered 2017-03-28: 500 mg via ORAL
  Filled 2017-03-27: qty 1

## 2017-03-27 MED ORDER — METRONIDAZOLE 500 MG PO TABS
500.0000 mg | ORAL_TABLET | Freq: Three times a day (TID) | ORAL | Status: DC
  Administered 2017-03-27 – 2017-03-28 (×3): 500 mg via ORAL
  Filled 2017-03-27 (×3): qty 1

## 2017-03-27 MED ORDER — CIPROFLOXACIN IN D5W 200 MG/100ML IV SOLN
200.0000 mg | Freq: Once | INTRAVENOUS | Status: AC
  Administered 2017-03-27: 200 mg via INTRAVENOUS
  Filled 2017-03-27 (×2): qty 100

## 2017-03-27 MED ORDER — SODIUM CHLORIDE 0.9 % IV SOLN
510.0000 mg | Freq: Once | INTRAVENOUS | Status: AC
  Administered 2017-03-27: 510 mg via INTRAVENOUS
  Filled 2017-03-27: qty 17

## 2017-03-27 NOTE — Progress Notes (Signed)
Subjective: The patient was seen and examined at bedside. He had one bowel movement which he describesas loose and brown in color. Denies abdominal pain, nausea or vomiting. As not required transfusion in the last 24 hours.  Objective: Vital signs in last 24 hours: Temp:  [97.7 F (36.5 C)-98.4 F (36.9 C)] 98.4 F (36.9 C) (10/09 0800) Pulse Rate:  [52-67] 67 (10/09 1218) Resp:  [12-21] 18 (10/09 1218) BP: (112-168)/(48-75) 165/65 (10/09 1218) SpO2:  [92 %-100 %] 100 % (10/09 1218) Weight:  [76.9 kg (169 lb 8.5 oz)] 76.9 kg (169 lb 8.5 oz) (10/09 0500) Weight change:  Last BM Date: 03/27/17  ZO:XWRU pallor GENERAL:not in acute distress  ABDOMEN:soft, non distended, non tender, normoactive bowel sounds EXTREMITIES:no deformity, no edema  Lab Results: Results for orders placed or performed during the hospital encounter of 03/24/17 (from the past 48 hour(s))  Hemoglobin and hematocrit, blood     Status: Abnormal   Collection Time: 03/25/17  8:54 PM  Result Value Ref Range   Hemoglobin 9.2 (L) 13.0 - 17.0 g/dL    Comment: DELTA CHECK NOTED POST TRANSFUSION SPECIMEN    HCT 27.2 (L) 39.0 - 52.0 %  Culture, Urine     Status: Abnormal   Collection Time: 03/25/17  9:46 PM  Result Value Ref Range   Specimen Description URINE, RANDOM    Special Requests NONE    Culture MULTIPLE SPECIES PRESENT, SUGGEST RECOLLECTION (A)    Report Status 03/27/2017 FINAL   Magnesium     Status: Abnormal   Collection Time: 03/26/17  3:47 AM  Result Value Ref Range   Magnesium 2.6 (H) 1.7 - 2.4 mg/dL  CBC with Differential/Platelet     Status: Abnormal   Collection Time: 03/26/17  3:47 AM  Result Value Ref Range   WBC 10.6 (H) 4.0 - 10.5 K/uL   RBC 3.08 (L) 4.22 - 5.81 MIL/uL   Hemoglobin 9.3 (L) 13.0 - 17.0 g/dL   HCT 27.6 (L) 39.0 - 52.0 %   MCV 89.6 78.0 - 100.0 fL   MCH 30.2 26.0 - 34.0 pg   MCHC 33.7 30.0 - 36.0 g/dL   RDW 15.8 (H) 11.5 - 15.5 %   Platelets 217 150 - 400 K/uL    Neutrophils Relative % 76 %   Neutro Abs 8.0 (H) 1.7 - 7.7 K/uL   Lymphocytes Relative 17 %   Lymphs Abs 1.8 0.7 - 4.0 K/uL   Monocytes Relative 6 %   Monocytes Absolute 0.6 0.1 - 1.0 K/uL   Eosinophils Relative 1 %   Eosinophils Absolute 0.1 0.0 - 0.7 K/uL   Basophils Relative 0 %   Basophils Absolute 0.0 0.0 - 0.1 K/uL  Basic metabolic panel     Status: Abnormal   Collection Time: 03/26/17  3:47 AM  Result Value Ref Range   Sodium 135 135 - 145 mmol/L   Potassium 4.9 3.5 - 5.1 mmol/L   Chloride 105 101 - 111 mmol/L   CO2 20 (L) 22 - 32 mmol/L   Glucose, Bld 129 (H) 65 - 99 mg/dL   BUN 84 (H) 6 - 20 mg/dL   Creatinine, Ser 3.02 (H) 0.61 - 1.24 mg/dL   Calcium 8.2 (L) 8.9 - 10.3 mg/dL   GFR calc non Af Amer 19 (L) >60 mL/min   GFR calc Af Amer 22 (L) >60 mL/min    Comment: (NOTE) The eGFR has been calculated using the CKD EPI equation. This calculation has not been validated in  all clinical situations. eGFR's persistently <60 mL/min signify possible Chronic Kidney Disease.    Anion gap 10 5 - 15  Glucose, capillary     Status: Abnormal   Collection Time: 03/26/17  8:13 AM  Result Value Ref Range   Glucose-Capillary 128 (H) 65 - 99 mg/dL  Hemoglobin and hematocrit, blood     Status: Abnormal   Collection Time: 03/26/17  2:58 PM  Result Value Ref Range   Hemoglobin 9.1 (L) 13.0 - 17.0 g/dL   HCT 27.3 (L) 39.0 - 52.0 %  CBC with Differential/Platelet     Status: Abnormal   Collection Time: 03/27/17  3:30 AM  Result Value Ref Range   WBC 8.1 4.0 - 10.5 K/uL   RBC 2.67 (L) 4.22 - 5.81 MIL/uL   Hemoglobin 8.1 (L) 13.0 - 17.0 g/dL   HCT 24.2 (L) 39.0 - 52.0 %   MCV 90.6 78.0 - 100.0 fL   MCH 30.3 26.0 - 34.0 pg   MCHC 33.5 30.0 - 36.0 g/dL   RDW 15.8 (H) 11.5 - 15.5 %   Platelets 212 150 - 400 K/uL   Neutrophils Relative % 74 %   Neutro Abs 6.0 1.7 - 7.7 K/uL   Lymphocytes Relative 18 %   Lymphs Abs 1.5 0.7 - 4.0 K/uL   Monocytes Relative 6 %   Monocytes Absolute 0.5  0.1 - 1.0 K/uL   Eosinophils Relative 2 %   Eosinophils Absolute 0.2 0.0 - 0.7 K/uL   Basophils Relative 0 %   Basophils Absolute 0.0 0.0 - 0.1 K/uL  Basic metabolic panel     Status: Abnormal   Collection Time: 03/27/17  3:30 AM  Result Value Ref Range   Sodium 135 135 - 145 mmol/L   Potassium 5.4 (H) 3.5 - 5.1 mmol/L   Chloride 108 101 - 111 mmol/L   CO2 19 (L) 22 - 32 mmol/L   Glucose, Bld 128 (H) 65 - 99 mg/dL   BUN 75 (H) 6 - 20 mg/dL   Creatinine, Ser 2.67 (H) 0.61 - 1.24 mg/dL   Calcium 8.1 (L) 8.9 - 10.3 mg/dL   GFR calc non Af Amer 22 (L) >60 mL/min   GFR calc Af Amer 25 (L) >60 mL/min    Comment: (NOTE) The eGFR has been calculated using the CKD EPI equation. This calculation has not been validated in all clinical situations. eGFR's persistently <60 mL/min signify possible Chronic Kidney Disease.    Anion gap 8 5 - 15  Glucose, capillary     Status: Abnormal   Collection Time: 03/27/17  8:55 AM  Result Value Ref Range   Glucose-Capillary 123 (H) 65 - 99 mg/dL   Comment 1 Notify RN    Comment 2 Document in Chart   Hemoglobin and hematocrit, blood     Status: Abnormal   Collection Time: 03/27/17 11:45 AM  Result Value Ref Range   Hemoglobin 8.5 (L) 13.0 - 17.0 g/dL   HCT 25.7 (L) 39.0 - 52.0 %  Potassium     Status: None   Collection Time: 03/27/17 11:51 AM  Result Value Ref Range   Potassium 4.7 3.5 - 5.1 mmol/L    Studies/Results: No results found.  Medications: I have reviewed the patient's current medications.  Assessment: 1.Diverticular bleeding,clinically appears resolved 2. Acute diverticulitis  Plan: 1.Advance diet  to regular meals 2.If hemoglobin remains stable today afternoon okay to discharge from GI standpoint. Patient will be scheduled for an outpatient colonoscopy in 4 to  6 weeks with me. Patient will need ciprofloxacin and Flagyl for a total of 10 days for treatment of diverticulitis. Discussed with patient's Hospitalist Dr.  Grandville Silos.   Ronnette Juniper 03/27/2017, 1:10 PM   Pager [phone] If no answer or after 5 PM call [phone]

## 2017-03-27 NOTE — Progress Notes (Signed)
PROGRESS NOTE    Caleb Hicks  KGM:[id number] DOB: [birth year] DOA: 03/24/2017 PCP: Patient, No Pcp Per    Brief Narrative:  Patient is a 76 year old gentleman history of chronic kidney disease stage IV, [name], hypertension, hyperlipidemia presenting to the ED with rectal bleeding 1 day. CT abdomen and pelvis also consistent with acute mild diverticulitis. Patient admitted to the step down unit. GI consulted. Patient placed empirically on IV ciprofloxacin and IV Flagyl.   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 CT abdomen and pelvis with mild acute diverticulitis of the descending colon-sigmoid colon junction. Negative for abscess. GI bleed seems to be slowing down. Patient states significant improvement with bloody bowel movements however still a little bit of blood which has improved.  Patient status post 2 units packed red blood cells hemoglobin currently at 8.1 from 9.3 from 7.3 from 9.4 and admission. Lightheadedness improved. H&H this afternoon.  If patient with continued bleeding will need a bleeding scan and possible IR guided embolization per GI recommendations. Diet has been advanced to a full liquid diet per GI. GI following.  #2 acute diverticulitis Per CT abdomen and pelvis. Patient denies any abdominal pain. Patient still with decreased bloody bowel movements. Leukocytosis has improved. Change empiric IV ciprofloxacin and IV Flagyl to  oral ciprofloxacin and Flagyl and treat for total of 10 days. Patient will need outpatient follow-up with GI for colonoscopy in about 4-6 weeks. GI following. Continue current full liquid diet.   #3 acute blood loss anemia/iron deficiency anemia/low B-12 levels Secondary to problem #1. Hemoglobin currently at 8.1 from 9.3 from 7.3 from 9.4. Patient status post 2 units packed red blood cells. Anemia panel consistent with iron deficiency anemia with low B-12 levels. Iron of 19, ferritin of 52, vitamin B-12 levels of 357. Will give a dose of IV iron.  Patient may need oral iron supplementation on discharge. Continue vitamin B-12 1000 MCG's IM daily 1 week, and then weekly 1 month, and then monthly. Outpatient follow-up.  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 Renal transplant ultrasound with Dopplers unremarkable. Urine studies pending. Renal function slowly trending down with hydration. Cyclosporine level pending. Saline lock IV fluids. Continue cyclosporine, prednisone. Outpatient follow-up.  #5 hyperuricemia Continue home regimen of allopurinol.  #6 dehydration Saline lock IV fluids.   #7 AAA Stable. 3.5 x 3.5 cm by CT abdomen and pelvis. Follow-up ultrasound in 2 years. Outpatient follow-up.  #8 hyperlipidemia Continue statin.  #9 leukocytosis Chest x-ray negative. CT abdomen and pelvis consistent with a mild acute diverticulitis. Leukocytosis trending down. Urinalysis pending. Continue empiric antibiotics of ciprofloxacin and Flagyl.   #10 hypertension Blood pressure improved on current regimen of beta blocker and hydralazine. ARB and diuretics held secondary to problem #4.   #11 sinus tachycardia Likely secondary to problem #1. Improved.   #  12 hyperkalemia Repeat lab test.   DVT prophylaxis: SCDs Code Status: Full Family Communication: Updated patient. No family at  bedside.  Disposition Plan: Transfer to [place]. Likely home once hemoglobin has stabilized, no further bleeding, clinical improvement hopefully in the next 1-2 days and per gastroenterology.   Consultants:   Gastroenterology: Tana Felts 03/25/2017  Procedures:   CT abdomen and pelvis 03/24/2017  Chest x-ray 03/24/2017  Renal transplant Ultrasound with Dopplers 03/24/2017  Transfuse 2 units packed red blood cells 03/25/2017  Antimicrobials:   IV ciprofloxacin 03/24/2017>>> oral ciprofloxacin 03/27/2017  IV Flagyl 03/24/2017>>>>>> oral Flagyl 03/27/2017   Subjective: Patient sitting up in bed. States feels better. Patient states a little bit of blood in the stools but not as much as on presentation. Patient denies any chest pain or shortness of breath. Tolerating full liquid diet. Asking when he'll be able to go home.   Objective: Vitals:   03/27/17 0500 03/27/17 0554 03/27/17 0635 03/27/17 0800  BP: (!) 150/48 (!) 150/48 (!) 144/60 132/71  Pulse: (!) 54  (!) 53 60  Resp: (!) 21  (!) 21 20  Temp:    98.4 F (36.9 C)  TempSrc:    Oral  SpO2: 97%  98% 95%  Weight: 76.9 kg (169 lb 8.5 oz)     Height:        Intake/Output Summary (Last 24 hours) at 03/27/17 1015 Last data filed at 03/27/17 0825  Gross per 24 hour  Intake              800 ml  Output              100 ml  Net              700 ml   Filed Weights   03/24/17 1700 03/25/17 0600 03/27/17 0500  Weight: 72.1 kg (158 lb 15.2 oz) 72 kg (158 lb 11.7 oz) 76.9 kg (169 lb 8.5 oz)    Examination:  General exam: NAD Respiratory system: Clear to auscultation Bilaterally. No crackles, no rhonchi, no wheezing. Cardiovascular system: Regular rate rhythm no murmurs rubs or gallops. No pedal edema. Gastrointestinal system: Abdomen is soft, nontender, nondistended, positive bowel sounds. No hepatosplenomegaly.  Central nervous system: Alert and oriented. No focal neurological deficits. Extremities: Symmetric 5 x 5  power. Skin: No rashes, lesions or ulcers Psychiatry: Judgement and insight appear normal. Mood & affect appropriate.     Data Reviewed: I have personally reviewed following labs and imaging studies  CBC:  Recent Labs Lab 03/24/17 1348 03/24/17 1548 03/24/17 2331 03/25/17 0706 03/25/17 2054 03/26/17 0347 03/26/17 1458 03/27/17 0330  WBC 12.9* 12.9* 9.9 9.5  --  10.6*  --  8.1  NEUTROABS 11.1*  --   --   --   --  8.0*  --  6.0  HGB 9.3* 9.4* 7.3* 7.3* 9.2* 9.3* 9.1* 8.1*  HCT 28.2* 29.2* 22.6* 22.1* 27.2* 27.6* 27.3* 24.2*  MCV 91.6 92.1 91.5 91.7  --  89.6  --  90.6  PLT 215 264 223 220  --  217  --  465   Basic Metabolic Panel:  Recent Labs Lab 03/24/17 1348 03/25/17 0706 03/26/17 0347 03/27/17 0330  NA 136 135 135 135  K 4.6 4.8 4.9 5.4*  CL 100* 102 105 108  CO2 22 22 20* 19*  GLUCOSE 195* 135* 129* 128*  BUN 95* 89* 84* 75*  CREATININE 3.35* 3.12* 3.02* 2.67*  CALCIUM 8.8* 8.3* 8.2* 8.1*  MG  --  1.5* 2.6*  --    GFR: Estimated Creatinine Clearance: 22 mL/min (A) (by C-G formula based on SCr of 2.67 mg/dL (H)). Liver Function Tests:  Recent Labs Lab 03/25/17 0706  AST 12*  ALT 11*  ALKPHOS 52  BILITOT 0.4  PROT 4.9*  ALBUMIN 2.3*   No results for input(s): LIPASE, AMYLASE in the last 168 hours. No results for input(s): AMMONIA in the last 168 hours. Coagulation Profile: No results for input(s): INR, PROTIME in the last 168 hours. Cardiac Enzymes: No results for input(s): CKTOTAL, CKMB, CKMBINDEX, TROPONINI in the last 168 hours. BNP (last 3 results) No results for input(s): PROBNP in the last 8760 hours. HbA1C: No results for input(s): HGBA1C in the last 72 hours. CBG:  Recent Labs Lab 03/25/17 0813 03/26/17 0813 03/27/17 0855  GLUCAP 133* 128* 123*   Lipid Profile: No results for input(s): CHOL, HDL, LDLCALC, TRIG, CHOLHDL, LDLDIRECT in the last 72 hours. Thyroid Function Tests: No results for input(s): TSH, T4TOTAL, FREET4,  T3FREE, THYROIDAB in the last 72 hours. Anemia Panel:  Recent Labs  03/24/17 1548  VITAMINB12 357  FOLATE 23.9  FERRITIN 52  TIBC 223*  IRON 19*  RETICCTPCT 1.8   Sepsis Labs: No results for input(s): PROCALCITON, LATICACIDVEN in the last 168 hours.  Recent Results (from the past 240 hour(s))  MRSA PCR Screening     Status: None   Collection Time: 03/24/17  5:42 PM  Result Value Ref Range Status   MRSA by PCR NEGATIVE NEGATIVE Final    Comment:        The GeneXpert MRSA Assay (FDA approved for NASAL specimens only), is one component of a comprehensive MRSA colonization surveillance program. It is not intended to diagnose MRSA infection nor to guide or monitor treatment for MRSA infections.   Culture, Urine     Status: Abnormal   Collection Time: 03/25/17  9:46 PM  Result Value Ref Range Status   Specimen Description URINE, RANDOM  Final   Special Requests NONE  Final   Culture MULTIPLE SPECIES PRESENT, SUGGEST RECOLLECTION (A)  Final   Report Status 03/27/2017 FINAL  Final         Radiology Studies: No results found.      Scheduled Meds: . allopurinol  100 mg Oral BID  . atorvastatin  20 mg Oral Daily  . calcium carbonate  2 tablet Oral Q breakfast  . calcium carbonate  1 tablet Oral QHS  . cyanocobalamin  1,000 mcg Intramuscular Daily  . cycloSPORINE modified  50 mg Oral QHS  . cycloSPORINE modified  75 mg Oral q morning - 10a  . hydrALAZINE  25 mg Oral Q8H  . metoprolol tartrate  50 mg Oral BID  . predniSONE  5 mg Oral Daily   Continuous Infusions: . ciprofloxacin 200 mg (03/27/17 0825)  . metronidazole Stopped (03/27/17 0354)     LOS: 3 days    Time spent: 52 mins    Zadrian Mccauley, MD [facility] Pager [phone]  If 7PM-7AM, please contact night-coverage [email or web address] Password [email or web address] 03/27/2017, 10:15 AM

## 2017-03-28 LAB — BASIC METABOLIC PANEL
Anion gap: 8 (ref 5–15)
BUN: 68 mg/dL — AB (ref 6–20)
CALCIUM: 8.5 mg/dL — AB (ref 8.9–10.3)
CO2: 19 mmol/L — ABNORMAL LOW (ref 22–32)
Chloride: 110 mmol/L (ref 101–111)
Creatinine, Ser: 2.92 mg/dL — ABNORMAL HIGH (ref 0.61–1.24)
GFR calc Af Amer: 23 mL/min — ABNORMAL LOW (ref >60)
GFR, EST NON AFRICAN AMERICAN: 19 mL/min — AB (ref >60)
GLUCOSE: 127 mg/dL — AB (ref 65–99)
POTASSIUM: 5.1 mmol/L (ref 3.5–5.1)
SODIUM: 137 mmol/L (ref 135–145)

## 2017-03-28 LAB — CBC WITH DIFFERENTIAL/PLATELET
BASOS ABS: 0 10*3/uL (ref 0.0–0.1)
Basophils Relative: 0 %
EOS ABS: 0.1 10*3/uL (ref 0.0–0.7)
EOS PCT: 2 %
HCT: 24.2 % — ABNORMAL LOW (ref 39.0–52.0)
Hemoglobin: 8.1 g/dL — ABNORMAL LOW (ref 13.0–17.0)
LYMPHS ABS: 1.3 10*3/uL (ref 0.7–4.0)
Lymphocytes Relative: 17 %
MCH: 30.5 pg (ref 26.0–34.0)
MCHC: 33.5 g/dL (ref 30.0–36.0)
MCV: 91 fL (ref 78.0–100.0)
Monocytes Absolute: 0.5 10*3/uL (ref 0.1–1.0)
Monocytes Relative: 7 %
Neutro Abs: 5.7 10*3/uL (ref 1.7–7.7)
Neutrophils Relative %: 74 %
PLATELETS: 220 10*3/uL (ref 150–400)
RBC: 2.66 MIL/uL — AB (ref 4.22–5.81)
RDW: 16 % — AB (ref 11.5–15.5)
WBC: 7.7 10*3/uL (ref 4.0–10.5)

## 2017-03-28 LAB — GLUCOSE, CAPILLARY: GLUCOSE-CAPILLARY: 112 mg/dL — AB (ref 65–99)

## 2017-03-28 LAB — CYCLOSPORINE: CYCLOSPORINE, LABCORP: 180 ng/mL (ref 100–400)

## 2017-03-28 MED ORDER — POLYSACCHARIDE IRON COMPLEX 150 MG PO CAPS: 150.0000 mg | ORAL_CAPSULE | Freq: Every day | ORAL | 0 refills | Status: DC

## 2017-03-28 MED ORDER — CIPROFLOXACIN HCL 500 MG PO TABS: 500.0000 mg | ORAL_TABLET | Freq: Every day | ORAL | 0 refills | Status: AC

## 2017-03-28 MED ORDER — HYDRALAZINE HCL 25 MG PO TABS: 25.0000 mg | ORAL_TABLET | Freq: Three times a day (TID) | ORAL | 0 refills | Status: DC

## 2017-03-28 MED ORDER — SENNOSIDES-DOCUSATE SODIUM 8.6-50 MG PO TABS: 1.0000 | ORAL_TABLET | Freq: Every day | ORAL | Status: AC

## 2017-03-28 MED ORDER — METRONIDAZOLE 500 MG PO TABS: 500.0000 mg | ORAL_TABLET | Freq: Three times a day (TID) | ORAL | 0 refills | Status: AC

## 2017-03-28 NOTE — Progress Notes (Signed)
Occupational Therapy Treatment Patient Details Name: Caleb Hicks MRN: [id number] DOB: [birth year] Today's Date: 03/28/2017    History of present illness Pt is a 77 y/o M history of chronic kidney disease stage IV, [name], hypertension, hyperlipidemia presenting to the ED with rectal bleeding 1 day. CT abdomen and pelvis also consistent with acute mild diverticulitis. Patient admitted to the step down unit. GI consulted.   OT comments    Follow Up Recommendations  Home health OT;Supervision/Assistance - 24 hour;Other (comment)    Equipment Recommendations  None recommended by OT;Other (comment) (will obtain high rise toilet)    Recommendations for Other Services      Precautions / Restrictions Precautions Precautions: Fall       Mobility Bed Mobility               General bed mobility comments: pt in chair  Transfers Overall transfer level: Needs assistance Equipment used: Rolling walker (2 wheeled) Transfers: Sit to/from [facility] Sit to Stand: Supervision Stand pivot transfers: Supervision                ADL either performed or assessed with clinical judgement   ADL Overall ADL's : Needs assistance/impaired                 Upper Body Dressing : Set up;Sitting   Lower Body Dressing: Minimal assistance;Sit to/from stand;Cueing for sequencing;Cueing for safety   Toilet Transfer: Ambulation;RW;Supervision/safety   Toileting- Clothing Manipulation and Hygiene: Sit to/from stand;Supervision/safety       Functional mobility during ADLs: Min guard;Rolling walker                 Cognition Arousal/Alertness: Awake/alert Behavior During Therapy: WFL for tasks assessed/performed Overall Cognitive Status: Within Functional Limits for tasks assessed                                 General Comments: Per daughter report Pt has been mildly confused        Exercises             Pertinent Vitals/  Pain       Faces Pain Scale: No hurt         Frequency  Min 2X/week        Progress Toward Goals  OT Goals(current goals can now be found in the care plan section)  Progress towards OT goals: Progressing toward goals     Plan Discharge plan remains appropriate    Co-evaluation                 AM-PAC PT "6 Clicks" Daily Activity     Outcome Measure   Help from another person eating meals?: None Help from another person taking care of personal grooming?: None Help from another person toileting, which includes using toliet, bedpan, or urinal?: A Little Help from another person bathing (including washing, rinsing, drying)?: A Little Help from another person to put on and taking off regular upper body clothing?: A Little Help from another person to put on and taking off regular lower body clothing?: A Little 6 Click Score: 20    End of Session    OT Visit Diagnosis: Unsteadiness on feet (R26.81);Muscle weakness (generalized) (M62.81)   Activity Tolerance Patient tolerated treatment well   Patient Left in chair;with family/visitor present   Nurse Communication Mobility status        Time: 5916-3846 OT Time Calculation (  min): 19 min  Charges: OT General Charges $OT Visit: 1 Visit OT Treatments $Self Care/Home Management : 8-22 mins  [place], [place]   Betsy Pries 03/28/2017, 1:11 PM

## 2017-03-28 NOTE — Progress Notes (Signed)
Physical Therapy Treatment Patient Details Name: Caleb Hicks MRN: [id number] DOB: [birth year] Today's Date: 03/28/2017    History of Present Illness Pt is a 76 y/o M history of chronic kidney disease stage IV, [name], hypertension, hyperlipidemia presenting to the ED with rectal bleeding 1 day. CT abdomen and pelvis also consistent with acute mild diverticulitis. Patient admitted to the step down unit. GI consulted.    PT Comments    Assisted OOB to amb a greater distance in hallway with RW.  Pt progressing well   Follow Up Recommendations  Home health PT     Equipment Recommendations  Rolling walker with 5" wheels    Recommendations for Other Services       Precautions / Restrictions Precautions Precautions: Fall Restrictions Weight Bearing Restrictions: No    Mobility  Bed Mobility Overal bed mobility: Needs Assistance Bed Mobility: Supine to Sit     Supine to sit: Min guard;Supervision     General bed mobility comments: increased time  Transfers Overall transfer level: Needs assistance Equipment used: Rolling walker (2 wheeled) Transfers: Sit to/from [facility] Sit to Stand: Supervision Stand pivot transfers: Supervision       General transfer comment: assist to rise and steady; verbal cues for hand placement during rise/descent   Ambulation/Gait Ambulation/Gait assistance: Supervision;Min guard Ambulation Distance (Feet): 225 Feet Assistive device: Rolling walker (2 wheeled) Gait Pattern/deviations: Step-through pattern;Antalgic Gait velocity: WFL   General Gait Details: good alternating gait with <25% VC's on safety with turns   Stairs            Wheelchair Mobility    Modified Rankin (Stroke Patients Only)       Balance                                            Cognition Arousal/Alertness: Awake/alert Behavior During Therapy: WFL for tasks assessed/performed Overall Cognitive  Status: Within Functional Limits for tasks assessed                                 General Comments: Per daughter report Pt has been mildly confused      Exercises      General Comments        Pertinent Vitals/Pain Pain Assessment: No/denies pain Faces Pain Scale: No hurt    Home Living                      Prior Function            PT Goals (current goals can now be found in the care plan section) Progress towards PT goals: Progressing toward goals    Frequency           PT Plan Current plan remains appropriate    Co-evaluation              AM-PAC PT "6 Clicks" Daily Activity  Outcome Measure  Difficulty turning over in bed (including adjusting bedclothes, sheets and blankets)?: A Little Difficulty moving from lying on back to sitting on the side of the bed? : A Little Difficulty sitting down on and standing up from a chair with arms (e.g., wheelchair, bedside commode, etc,.)?: Unable Help needed moving to and from a bed to chair (including a wheelchair)?: Total Help needed walking in hospital  room?: Total Help needed climbing 3-5 steps with a railing? : Total 6 Click Score: 10    End of Session Equipment Utilized During Treatment: Gait belt Activity Tolerance: Patient tolerated treatment well Patient left: in chair;with call bell/phone within reach;with chair alarm set;with family/visitor present Nurse Communication: Mobility status PT Visit Diagnosis: Unsteadiness on feet (R26.81)     Time: 1140-1155 PT Time Calculation (min) (ACUTE ONLY): 15 min  Charges:  $Gait Training: 8-22 mins                    G Codes:       Rica Koyanagi  PTA [facility]  Acute  Rehab Pager      [phone]

## 2017-03-28 NOTE — Care Management Important Message (Signed)
Important Message  Patient Details  Name: Arman Loy MRN: [id number] Date of Birth: 11-07-1940   Medicare Important Message Given:  Yes    Kerin Salen 03/28/2017, 11:02 AMImportant Message  Patient Details  Name: Alecxis Baltzell MRN: [id number] Date of Birth: 12-Jun-1941   Medicare Important Message Given:  Yes    Kerin Salen 03/28/2017, 11:02 AM

## 2017-03-28 NOTE — Discharge Summary (Signed)
Physician Discharge Summary  Caleb Hicks ZOX:[id number] DOB: March 02, 1941 DOA: 03/24/2017  PCP: Patient, No Pcp Per  Admit date: 03/24/2017 Discharge date: 03/28/2017  Time spent: 65 minutes  Recommendations for Outpatient Follow-up:  1. Follow-up with Dr. Linnell Fulling in 1-2 weeks. On follow-up patient will need a renal panel to follow-up on electrolytes and renal function. Patient's blood pressure need to be reassessed as his antihypertensive medications have been changed due to his presentation with acute on chronic kidney disease. Patient also need a CBC done to follow-up on H&H. Discharge hemoglobin was 8.1. 2. Follow-up with Dr. Therisa Doyne gastroenterology.Office will call with appointment time. Patient will need to be set up for colonoscopy in about 4-6 weeks.   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 male with medical history significant of status post renal transplant approximately 21 years ago, hypertension, hyperlipidemia, AAA who presented to the ED with a one-day history of multiple bloody bowel movements. Patient stated that around 6 or 7 PM the night prior to admission had multiple bloody bowel movements which were bright red in  color. Patient stated that bowel movements continued through the morning of admission and he subsequently presented to the ED where he had another bloody bowel movement. Patient denied any melanotic stools, no hematemesis. Patient denied any fevers, no chills, no nausea, no vomiting, no chest pain, no shortness of breath, no abdominal pain, no diarrhea, no prior history of bloody bowel movements, no history of peptic ulcer disease, no history of diverticulosis, no history of NSAID use. Patient is on chronic prednisone. Patient did state good urine output. Patient did endorse some generalized weakness, near syncope, lightheadedness.  ED Course: Patient seen in the emergency room, basic metabolic profile obtained at a BUN of 95 creatinine of 3.35 otherwise was within normal limits. CBC had a white count of 12.9, hemoglobin of 9.3, platelet count of 2:15. Urinalysis not done. EKG showed a sinus tachycardia. ED physician stated she spoke with gastroenterology from [facility], Dr Remi Haggard who recommended CT abdomen and pelvis and will formally consult on the patient.  Hospital Course:  #1 probable acute GI bleed Likely diverticular bleed. CT abdomen and pelvis with mild acute diverticulitis of the descending colon-sigmoid colon junction. Negative for abscess. GI bleed low down during the hospitalization. Patient was initially was placed on serial CBCs and followed.  Patient status post 2 units packed red blood cells hemoglobin at 8.1 from 9.3 from 7.3 from 9.4 on admission. Lightheadedness improved. GI was consulted who followed the patient throughout the hospitalization. Patient was initially started on a clear liquid diet which was subsequently advanced as patient improved clinically and  bleeding slowed down. Patient's hemoglobin stabilized. Patient will follow-up with GI in th outpatient setting for colonoscopy in about 4-6 weeks.  #2 acute diverticulitis Per CT abdomen and pelvis. Patient denied any abdominal  pain. Patient still with decreased bloody bowel movements. Leukocytosis improved. Patient was initially started on IV ciprofloxacin and IV Flagyl and subsequently transitioned to oral ciprofloxacin and Flagyl which she tolerated and goal was to treat for total of 10 days.Patient was seen by gastroenterology during the hospitalization. Patient will need outpatient follow-up with GI for colonoscopy in about 4-6 weeks. Patient was initially placed in a clear liquid diet and as he improved clinically advanced to a soft diet which he tolerated.  #3 acute blood loss anemia/iron deficiency anemia/low B-12 levels Secondary to problem #1. Hemoglobin improved from a low of 7.3 to 8.1 by day of discharge. Patient status post 2 units packed red blood cells. Anemia panel consistent with iron deficiency anemia with low B-12 levels. Iron of 19, ferritin of 52, vitamin B-12 levels of 357. Patient received IV iron during the hospitalization.  Patient may need oral iron supplementation on discharge. Patient started on vitamin B-12 1000 MCG's IM daily 1 week, and then weekly 1 month, and then monthly. Outpatient follow-up.  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 Renal transplant ultrasound with Dopplers unremarkable. Urine studies negative. Renal function slowly trended down with hydration. Cyclosporine level pending. Patient was gently hydrated with IV fluids and maintained on home regimen of cyclosporine, prednisone. Outpatient follow-up.  #5 hyperuricemia Maintained on home regimen of allopurinol.  #6 dehydration Patient was noted to be dehydrated on admission and was placed on IV fluids and was euvolemic by day of discharge.  #7 AAA Stable. 3.5 x 3.5 cm by CT abdomen and pelvis. Follow-up ultrasound in 2 years. Outpatient follow-up.  #8 hyperlipidemia Continued on home regimen of statin.  #9  leukocytosis Chest x-ray negative. CT abdomen and pelvis consistent with a mild acute diverticulitis. Leukocytosis trended down on empiric antibiotics of ciprofloxacin and Flagyl.   #10 hypertension Blood pressure improved on current regimen of beta blocker and hydralazine. ARB and diuretics held secondary to problem #4. utpatient follow-up.  #11 sinus tachycardia Likely secondary to problem #1. resolved   #12 hyperkalemia Secondary to hemolysis. Repeat potassium levels showed resolution of hyperkalemia.   Procedures:  CT abdomen and pelvis 03/24/2017  Chest x-ray 03/24/2017  Renal transplant Ultrasound with Dopplers 03/24/2017  Transfuse 2 units packed red blood cells 03/25/2017  Consultations:  Gastroenterology: Tana Felts 03/25/2017  Discharge Exam: Vitals:   03/28/17 0519 03/28/17 0925  BP: (!) 153/75 (!) 142/65  Pulse: 66   Resp: 20   Temp: 97.9 F (36.6 C)   SpO2: 97%     General: NAD Cardiovascular: RRR Respiratory: CTAB  Discharge Instructions   Discharge Instructions    Diet - low sodium heart healthy    Complete by:  As directed    Renal diet   Increase activity slowly    Complete by:  As directed      Current Discharge Medication List    START taking these medications   Details  ciprofloxacin (CIPRO) 500 MG tablet Take 1 tablet (500 mg total) by mouth daily before breakfast. Take for 7 days then stop. Qty: 7 tablet, Refills: 0    hydrALAZINE (APRESOLINE) 25 MG tablet Take 1 tablet (25 mg total) by mouth every 8 (eight) hours. Qty: 90 tablet,  Refills: 0    iron polysaccharides (NU-IRON) 150 MG capsule Take 1 capsule (150 mg total) by mouth daily. Qty: 30 capsule, Refills: 0    metroNIDAZOLE (FLAGYL) 500 MG tablet Take 1 tablet (500 mg total) by mouth every 8 (eight) hours. Take for 7 days then stop. Qty: 21 tablet, Refills: 0    senna-docusate (SENOKOT-S) 8.6-50 MG tablet Take 1 tablet by mouth at bedtime.      CONTINUE these  medications which have NOT CHANGED   Details  allopurinol (ZYLOPRIM) 100 MG tablet Take 100 mg by mouth 2 (two) times daily. Refills: 0    atorvastatin (LIPITOR) 20 MG tablet Take 20 mg by mouth daily.    calcium gluconate 500 MG tablet Take 500-1,000 mg by mouth 2 (two) times daily.    FLUAD 0.5 ML SUSY Inject 1 Dose into the muscle once. Refills: 0    metoprolol tartrate (LOPRESSOR) 50 MG tablet Take 50 mg by mouth 2 (two) times daily. Refills: 0    NEORAL 25 MG capsule Take 50-75 mg by mouth 2 (two) times daily. Take 75 mg (3 capsules) in the morning and 50 mg (2 capsules) in the evening Refills: 3    predniSONE (DELTASONE) 5 MG tablet Take 5 mg by mouth daily. Refills: 0      STOP taking these medications     furosemide (LASIX) 40 MG tablet      telmisartan (MICARDIS) 80 MG tablet      aspirin 325 MG tablet        Allergies  Allergen Reactions  . Norvasc [Amlodipine Besylate]     Pt state it cause him weight gain   Follow-up Information    [facility]. Schedule an appointment as soon as possible for a visit in 3 week(s).   Contact information: [place] [address] [phone]        Clent Jacks, MD. Schedule an appointment as soon as possible for a visit in 1 week(s).   Specialty:  Specialist Why:  f/u in 1-2 weeks       Ronnette Juniper, MD Follow up.   Specialty:  Gastroenterology Why:  office will call with appointment time. Contact information: [address] [place] [address] [phone]            The results of significant diagnostics from this hospitalization (including imaging, microbiology, ancillary and laboratory) are listed below for reference.    Significant Diagnostic Studies: Ct Abdomen Pelvis Wo Contrast  Result Date: 03/24/2017 CLINICAL DATA:  76 year old male with a history of AAA, kidney transplant about 21 years ago, hypertension who presents with rectal bleeding.  He states it started last night about 6 PM. He's had multiple episodes of bloody stool since that time. He states he is mostly passing just blood without any stool. He's been feeling lightheaded and had 2 near-syncopal episodes this morning. He denies any abdominal pain. No nausea or vomiting. No fevers. No prior episodes of similar symptoms. He's had a colonoscopy one time in the past but he says was normal but this was done in [place]. EXAM: CT ABDOMEN AND PELVIS WITHOUT CONTRAST TECHNIQUE: Multidetector CT imaging of the abdomen and pelvis was performed following the standard protocol without IV contrast. COMPARISON:  None. FINDINGS: Lower chest: Mild right basilar atelectasis. Hepatobiliary: No focal hepatic mass. No intrahepatic or extrahepatic biliary ductal dilatation. High attenuation material along the deep tendon aspect of the gallbladder likely  reflecting gallbladder sludge versus tiny cholelithiasis. Pancreas: Unremarkable. No pancreatic ductal dilatation or surrounding inflammatory changes. Spleen: Normal in size without focal abnormality. Adrenals/Urinary Tract: Normal adrenal glands. Severely atrophic bilateral native kidneys. Multiple bilateral hypodense, fluid attenuating bilateral renal masses most consistent with cysts. Along the inferior pole of the right kidney there is a 15 mm hyperdense right renal mass measuring 81 Hounsfield units most consistent with a hemorrhagic cyst. Large hypodense 3.5 x 4.4 cm midpole right renal mass with high attenuation anterior layering dependently likely reflecting a proteinaceous cyst. Transplanted kidney is noted in the right iliac fossa. No obstructive uropathy. No urolithiasis. Normal bladder. Stomach/Bowel: No bowel dilatation to suggest bowel obstruction. Diverticulosis of the descending colon and sigmoid colon with mild pericolonic inflammatory changes at the descending colon- sigmoid colon junction concerning for mild diverticulitis. No  peridiverticular fluid collection to suggest an abscess. No pneumatosis, pneumoperitoneum or portal venous gas. No abdominal or pelvic free fluid. Vascular/Lymphatic: Abdominal aortic atherosclerosis. Infrarenal abdominal aortic aneurysm measuring 3.5 x 3.5 cm. No lymphadenopathy. Reproductive: Prostate is unremarkable. Other: Fat containing supraumbilical hernia. No abdominopelvic ascites. Musculoskeletal: No acute osseous abnormality. No lytic or sclerotic osseous lesion. 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 Grade 1 anterolisthesis of L4 on L5 secondary to facet disease. Mild osteoarthritis of bilateral hips. IMPRESSION: 1. Mild acute diverticulitis of the descending colon-sigmoid colon junction. No peridiverticular fluid collection to suggest an abscess. 2. 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 3. 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 4. Infrarenal abdominal aortic aneurysm measured 3.5 x 3.5 cm. Recommend followup by ultrasound in 2 years. This recommendation follows ACR consensus guidelines: White Paper of the ACR Incidental Findings Committee II on Vascular Findings. J Am Coll Radiol 2013; 10:789-794. 5.  Aortic aneurysm NOS (ICD10-I71.9) 6.  Aortic Atherosclerosis (ICD10-170.0) Electronically Signed   By: Kathreen Devoid   On: 03/24/2017 16:39   US Renal Transplant W/doppler  Result Date: 03/24/2017 CLINICAL DATA:  Acute renal failure, hypertension, prior kidney transplant EXAM: ULTRASOUND OF RENAL TRANSPLANT WITH RENAL DOPPLER ULTRASOUND TECHNIQUE: Ultrasound examination of the renal transplant was performed with gray-scale, color and duplex doppler evaluation. COMPARISON:  CT abdomen and pelvis 03/24/2017 FINDINGS: Transplant kidney location: RIGHT iliac fossa Transplant Kidney: Length: 12.4 cm. Normal in size and parenchymal echogenicity. No evidence of mass or  hydronephrosis. No peri-transplant fluid collection seen. Color flow in the main renal artery:  Present Color flow in the main renal vein:  Present Duplex Doppler Evaluation: Main Renal Artery Resistive Index: 0.74 Venous waveform in main renal vein:  Present Intrarenal resistive index in upper pole:  0.64 (normal 0.6-0.8; equivocal 0.8-0.9; abnormal >= 0.9) Intrarenal resistive index in lower pole: 0.64 (normal 0.6-0.8; equivocal 0.8-0.9; abnormal >= 0.9) Bladder: Normal appearance. Transplant ureter jet was not visualized. Other findings:  None. IMPRESSION: Unremarkable transplant kidney at RIGHT iliac fossa. Electronically Signed   By: Lavonia Dana M.D.   On: 03/24/2017 18:35   Dg Chest Port 1 View  Result Date: 03/24/2017 CLINICAL DATA:  Acute onset of generalized weakness. Near syncope. Leukocytosis. Initial encounter. EXAM: PORTABLE CHEST 1 VIEW COMPARISON:  None. FINDINGS: The lungs are well-aerated and clear. There is no evidence of focal opacification, pleural effusion or pneumothorax. The cardiomediastinal silhouette is within normal limits. No acute osseous abnormalities are seen. IMPRESSION: No acute cardiopulmonary process seen. Electronically Signed   By: Garald Balding M.D.   On: 03/24/2017 18:36  Microbiology: Recent Results (from the past 240 hour(s))  MRSA PCR Screening     Status: None   Collection Time: 03/24/17  5:42 PM  Result Value Ref Range Status   MRSA by PCR NEGATIVE NEGATIVE Final    Comment:        The GeneXpert MRSA Assay (FDA approved for NASAL specimens only), is one component of a comprehensive MRSA colonization surveillance program. It is not intended to diagnose MRSA infection nor to guide or monitor treatment for MRSA infections.   Culture, Urine     Status: Abnormal   Collection Time: 03/25/17  9:46 PM  Result Value Ref Range Status   Specimen Description URINE, RANDOM  Final   Special Requests NONE  Final   Culture MULTIPLE SPECIES PRESENT, SUGGEST  RECOLLECTION (A)  Final   Report Status 03/27/2017 FINAL  Final     Labs: Basic Metabolic Panel:  Recent Labs Lab 03/24/17 1348 03/25/17 0706 03/26/17 0347 03/27/17 0330 03/27/17 1151 03/28/17 0616  NA 136 135 135 135  --  137  K 4.6 4.8 4.9 5.4* 4.7 5.1  CL 100* 102 105 108  --  110  CO2 22 22 20* 19*  --  19*  GLUCOSE 195* 135* 129* 128*  --  127*  BUN 95* 89* 84* 75*  --  68*  CREATININE 3.35* 3.12* 3.02* 2.67*  --  2.92*  CALCIUM 8.8* 8.3* 8.2* 8.1*  --  8.5*  MG  --  1.5* 2.6*  --   --   --    Liver Function Tests:  Recent Labs Lab 03/25/17 0706  AST 12*  ALT 11*  ALKPHOS 52  BILITOT 0.4  PROT 4.9*  ALBUMIN 2.3*   No results for input(s): LIPASE, AMYLASE in the last 168 hours. No results for input(s): AMMONIA in the last 168 hours. CBC:  Recent Labs Lab 03/24/17 1348  03/24/17 2331 03/25/17 0706  03/26/17 0347 03/26/17 1458 03/27/17 0330 03/27/17 1145 03/28/17 0616  WBC 12.9*  < > 9.9 9.5  --  10.6*  --  8.1  --  7.7  NEUTROABS 11.1*  --   --   --   --  8.0*  --  6.0  --  5.7  HGB 9.3*  < > 7.3* 7.3*  < > 9.3* 9.1* 8.1* 8.5* 8.1*  HCT 28.2*  < > 22.6* 22.1*  < > 27.6* 27.3* 24.2* 25.7* 24.2*  MCV 91.6  < > 91.5 91.7  --  89.6  --  90.6  --  91.0  PLT 215  < > 223 220  --  217  --  212  --  220  < > = values in this interval not displayed. Cardiac Enzymes: No results for input(s): CKTOTAL, CKMB, CKMBINDEX, TROPONINI in the last 168 hours. BNP: BNP (last 3 results) No results for input(s): BNP in the last 8760 hours.  ProBNP (last 3 results) No results for input(s): PROBNP in the last 8760 hours.  CBG:  Recent Labs Lab 03/25/17 0813 03/26/17 0813 03/27/17 0855 03/28/17 0747  GLUCAP 133* 128* 123* 112*       Signed:  Keilen Kahl MD.  [facility] 03/28/2017, 11:53 AM

## 2017-05-22 ENCOUNTER — Other Ambulatory Visit: Payer: Self-pay | Admitting: Urology

## 2017-05-22 ENCOUNTER — Other Ambulatory Visit: Payer: Self-pay

## 2017-05-22 ENCOUNTER — Encounter (HOSPITAL_BASED_OUTPATIENT_CLINIC_OR_DEPARTMENT_OTHER): Payer: Self-pay | Admitting: *Deleted

## 2017-05-22 NOTE — Progress Notes (Addendum)
NPO AFTER MIDNIGHT, ARRIVE 1000 AM 05-28-17 [facility], NEEDS I STAT 8 , NEPHROLOGY CLEARANCE DR Welton Flakes 05-17-17 ON CHART LOV DR DILLEY 04-25-17 ON CHART, EKG 03-24-17 Epic AND ON CHART, CHEST XRAY 03-24-17 Epic AND ON CHART, 03-24-17 CT ABDOMEN PELVIS 03-24-17 ON CHART AND Epic. DAUGHTER OR SISTER IN LAW DRIVER Spoke with patient on 05-30-17 and made aware surgery date changed to 06-04-17 arrive 715 am take allopurinol, ,etoprolol, and prednisone sip of water in am , npo after midnight.daughter driver

## 2017-06-03 NOTE — H&P (Signed)
CC: I have swelling in my scrotum.  HPI: Caleb Hicks is a 76 year-old male patient who was referred by Dr. Malachy Hicks. Caleb Silos, MD who is here for scrotal swelling.  He first noticed his hydrocele 2 years ago. His hydrocele is on both sides. He does not have pain on the side of his hydrocele. His hydrocele does cause restriction of normal activities.   He has not had injuries to the testicles or scrotum. He has had the following surgeries: Hydrocele Repair. The patient denies having the following scrotal surgeries: Vasectomy, Hernia Repair, Undescended Testis Surgery, and Varicocele Repair. His hydrocele does bother him enough to consider surgical repair. He has not had a testicular infection.   The patient has a history of a hydrocele. About 10 years ago this was repaired over an [place]. It has now recurred for the past 2 years. He has a lot of urinary difficulties given that his penis is now retracted. Some of this is a result of having a large hydroceles. Right side is worse than left. He has a history of kidney transplant   Incidentally, he was found to have possible microscopic hematuria on voided urine. He is however voiding through foreskin. He had a CT without contrast performed recently that showed bilateral renal cysts but no concerning masses. He cannot receive IV contrast secondary to kidney function.     ALLERGIES: Norvasc Tape    MEDICATIONS: Allopurinol 100 mg tablet  Metoprolol Tartrate 50 mg tablet  Acetaminophen 325 mg capsule  Aspirin Ec 325 mg tablet, delayed release  Atorvastatin Calcium 20 mg tablet  Calcium Gluconate  Ciprofloxacin  Cyclosporine Modified 25 mg capsule  Furosemide 40 mg tablet  Metronidazole 500 mg tablet  Poly-Iron  Prednisone 5 mg tablet  Sennosides-Docusate Sodium 8.6 mg-50 mg tablet  Telmisartan 80 mg tablet     GU PSH: None   NON-GU PSH: Hernia Repair, Right    GU PMH: Kidney Failure Unspec      PMH Notes: Hepatitis    NON-GU PMH: Gout Hypercholesterolemia Hypertension    FAMILY HISTORY: None   SOCIAL HISTORY: None   REVIEW OF SYSTEMS:    GU Review Male:   Patient denies frequent urination, hard to postpone urination, burning/ pain with urination, get up at night to urinate, leakage of urine, stream starts and stops, trouble starting your stream, have to strain to urinate , erection problems, and penile pain.  Gastrointestinal (Upper):   Patient denies nausea, vomiting, and indigestion/ heartburn.  Gastrointestinal (Lower):   Patient denies diarrhea and constipation.  Constitutional:   Patient denies fever, night sweats, weight loss, and fatigue.  Skin:   Patient denies skin rash/ lesion and itching.  Eyes:   Patient denies blurred vision and double vision.  Ears/ Nose/ Throat:   Patient denies sore throat and sinus problems.  Hematologic/Lymphatic:   Patient denies swollen glands and easy bruising.  Cardiovascular:   Patient denies leg swelling and chest pains.  Respiratory:   Patient denies cough and shortness of breath.  Endocrine:   Patient denies excessive thirst.  Musculoskeletal:   Patient denies back pain and joint pain.  Neurological:   Patient denies headaches and dizziness.  Psychologic:   Patient denies depression and anxiety.   VITAL SIGNS:      05/04/2017 09:51 AM  Weight 164 lb / 74.39 kg  Height 67 in / 170.18 cm  BP 159/93 mmHg  Heart Rate 97 /min  BMI 25.7 kg/m   GU PHYSICAL EXAMINATION:  Testes: Bilateral hemiscrotal swelling right greater than left consistent with hydrocele  Penis: Uncircumcised, retracted but able to retract the foreskin to expose the penis. There are no lesions or masses.   MULTI-SYSTEM PHYSICAL EXAMINATION:    Constitutional: Well-nourished. No physical deformities. Normally developed. Good grooming.  Respiratory: No labored breathing, no use of accessory muscles.   Cardiovascular: Normal temperature, adequate peripheral perfusion  Skin: No  paleness, no jaundice  Neurologic / Psychiatric: Oriented to time, oriented to place, oriented to person. No depression, no anxiety, no agitation.  Gastrointestinal: No mass, no tenderness, no rigidity, non obese abdomen.  Eyes: Normal conjunctivae. Normal eyelids.  Musculoskeletal: Normal gait and station of head and neck.     PAST DATA REVIEWED:  Source Of History:  Patient  Records Review:   Previous Doctor Records   PROCEDURES:         Scrotal Ultrasound - [id number]  Right Testicle: Length: 4.5 cm  Height: 1.9 cm  Width: 3.3 cm  Left Testicle: Length: 4.5 cm  Height: 2.2 cm  Width: 3.2 cm  Left Testis/Epididymis:  Testicle seen with blood flow. Cystic area noted within= 0.15cm. Multiple cystic areas seen in epi head, largest= 0.67cm body and tail not seen. hydrocele noted. Varicocele: Vessels not seen well.  Right Testis/Epididymis:  Testicle seen with blood flow, Hypoechoic area noted within ? rete testis?= 3.0x0.96x1.6cm. Multiple cystic areas seen in epi head, largest= 1.4x1.2cm body and tail of epi not seen. Varicocele: Vessels not seen well. Large hydrocele noted ( to large to measure).                Catheter / SP Tube - [id number] In and Out Catheterization  A 14 French red rubber or straight catheter was inserted into the bladder using sterile technique. A urinalysis was sent to the lab. A urine culture was sent to the lab. 50 cc of urine was obtained.         Urinalysis w/Scope Dipstick Dipstick Cont'd Micro  Color: Yellow Bilirubin: Neg WBC/hpf: 0 - 5/hpf  Appearance: Clear Ketones: Neg RBC/hpf: 3 - 10/hpf  Specific Gravity: 1.020 Blood: 3+ Bacteria: Rare (0-9/hpf)  pH: 5.5 Protein: 2+ Cystals: Amorph Urates  Glucose: Neg Urobilinogen: 0.2 Casts: NS (Not Seen)    Nitrites: Neg Trichomonas: Not Present    Leukocyte Esterase: Neg Mucous: Present      Epithelial Cells: 0 - 5/hpf      Yeast: NS (Not Seen)      Sperm: Not Present         Urinalysis w/Scope Dipstick  Dipstick Cont'd Micro  Color: Yellow Bilirubin: Neg WBC/hpf: NS (Not Seen)  Appearance: Clear Ketones: Neg RBC/hpf: 10 - 20/hpf  Specific Gravity: 1.020 Blood: 3+ Bacteria: Few (10-25/hpf)  pH: 5.5 Protein: 2+ Cystals: NS (Not Seen)  Glucose: Neg Urobilinogen: 0.2 Casts: NS (Not Seen)    Nitrites: Neg Trichomonas: Not Present    Leukocyte Esterase: Neg Mucous: Present      Epithelial Cells: 0 - 5/hpf      Yeast: NS (Not Seen)      Sperm: Not Present    ASSESSMENT:      ICD-10 Details  1 GU:   Hydrocele, Unspec - N43.3   2   Microscopic hematuria - R31.21    PLAN:           Orders Labs Urinalysis, Urine Cytology  X-Rays: Scrotal Ultrasound          Schedule Labs: 2 Weeks - Urinalysis  Return  Visit/Planned Activity: 1-2 Weeks - Cystoscopy          Document Letter(s):  Created for Patient: Clinical Summary         Notes:   The patient would like to undergo a hydrocelectomy. He understands the risks included but not limited to bleeding, infection, injury to surrounding structures, hematoma formation in the scrotum, injury to the testicle, and the risks of anesthesia. He'll need clearance from his primary care physician given his medical history. He is also on aspirin 325 and would need to come off that.   Microscopic hematuria was confirmed with a straight catheterization urine sample. I have discussed with the patient that the evaluation of asymptomatic microscopic hematuria consists of upper tract imaging preferably with a CT IVP and cystoscopy to exclude the potential of malignancy, nephrolithiasis, or anatomical abnormalities. He however cannot undergo a CT scan with contrast due to renal function. We'll proceed with cystoscopy in the office and also obtain cytology.        Signed by Link Snuffer, III, M.D. on 05/04/17 at 11:34 AM (EST

## 2017-06-04 ENCOUNTER — Encounter (HOSPITAL_BASED_OUTPATIENT_CLINIC_OR_DEPARTMENT_OTHER)
Admission: RE | Disposition: A | Discharge status: Home / Self Care | Payer: Self-pay | Source: Ambulatory Visit | Attending: Urology

## 2017-06-04 ENCOUNTER — Encounter (HOSPITAL_BASED_OUTPATIENT_CLINIC_OR_DEPARTMENT_OTHER): Payer: Self-pay | Admitting: Certified Registered"

## 2017-06-04 ENCOUNTER — Other Ambulatory Visit: Payer: Self-pay

## 2017-06-04 ENCOUNTER — Ambulatory Visit (HOSPITAL_BASED_OUTPATIENT_CLINIC_OR_DEPARTMENT_OTHER)
Admission: RE | Admit: 2017-06-04 | Discharge: 2017-06-04 | Disposition: A | Discharge status: Home / Self Care | Payer: Medicare Other | Source: Ambulatory Visit | Attending: Urology | Admitting: Urology

## 2017-06-04 DIAGNOSIS — E7800 Pure hypercholesterolemia, unspecified: Secondary | ICD-10-CM | POA: Insufficient documentation

## 2017-06-04 DIAGNOSIS — Z7982 Long term (current) use of aspirin: Secondary | ICD-10-CM | POA: Diagnosis not present

## 2017-06-04 DIAGNOSIS — Z940 Kidney transplant status: Secondary | ICD-10-CM | POA: Diagnosis not present

## 2017-06-04 DIAGNOSIS — N433 Hydrocele, unspecified: Secondary | ICD-10-CM | POA: Insufficient documentation

## 2017-06-04 DIAGNOSIS — Z791 Long term (current) use of non-steroidal anti-inflammatories (NSAID): Secondary | ICD-10-CM | POA: Diagnosis not present

## 2017-06-04 DIAGNOSIS — M109 Gout, unspecified: Secondary | ICD-10-CM | POA: Diagnosis not present

## 2017-06-04 DIAGNOSIS — I10 Essential (primary) hypertension: Secondary | ICD-10-CM | POA: Insufficient documentation

## 2017-06-04 DIAGNOSIS — Z79899 Other long term (current) drug therapy: Secondary | ICD-10-CM | POA: Insufficient documentation

## 2017-06-04 HISTORY — DX: Gout, unspecified: M10.9

## 2017-06-04 HISTORY — PX: HYDROCELE EXCISION: SHX482

## 2017-06-04 HISTORY — DX: Diverticulitis of intestine, part unspecified, without perforation or abscess without bleeding: K57.92

## 2017-06-04 LAB — POCT I-STAT, CHEM 8
BUN: 79 mg/dL — ABNORMAL HIGH (ref 6–20)
CALCIUM ION: 1.32 mmol/L (ref 1.15–1.40)
Chloride: 109 mmol/L (ref 101–111)
Creatinine, Ser: 2.1 mg/dL — ABNORMAL HIGH (ref 0.61–1.24)
GLUCOSE: 113 mg/dL — AB (ref 65–99)
HCT: 36 % — ABNORMAL LOW (ref 39.0–52.0)
HEMOGLOBIN: 12.2 g/dL — AB (ref 13.0–17.0)
Potassium: 5.4 mmol/L — ABNORMAL HIGH (ref 3.5–5.1)
Sodium: 139 mmol/L (ref 135–145)
TCO2: 23 mmol/L (ref 22–32)

## 2017-06-04 SURGERY — HYDROCELECTOMY
Anesthesia: General | Site: Scrotum | Laterality: Bilateral

## 2017-06-04 MED ORDER — FENTANYL CITRATE (PF) 100 MCG/2ML IJ SOLN
INTRAMUSCULAR | Status: AC
  Filled 2017-06-04: qty 2

## 2017-06-04 MED ORDER — LIDOCAINE HCL 2 % IJ SOLN
INTRAMUSCULAR | Status: DC | PRN
  Administered 2017-06-04: 10 mL

## 2017-06-04 MED ORDER — EPHEDRINE SULFATE-NACL 50-0.9 MG/10ML-% IV SOSY
PREFILLED_SYRINGE | INTRAVENOUS | Status: DC | PRN
  Administered 2017-06-04: 10 mg via INTRAVENOUS

## 2017-06-04 MED ORDER — ONDANSETRON HCL 4 MG/2ML IJ SOLN
INTRAMUSCULAR | Status: AC
  Filled 2017-06-04: qty 2

## 2017-06-04 MED ORDER — PROPOFOL 10 MG/ML IV BOLUS
INTRAVENOUS | Status: AC
  Filled 2017-06-04: qty 20

## 2017-06-04 MED ORDER — FENTANYL CITRATE (PF) 100 MCG/2ML IJ SOLN
25.0000 ug | INTRAMUSCULAR | Status: DC | PRN
  Administered 2017-06-04: 25 ug via INTRAVENOUS
  Filled 2017-06-04: qty 1

## 2017-06-04 MED ORDER — FENTANYL CITRATE (PF) 100 MCG/2ML IJ SOLN
INTRAMUSCULAR | Status: DC | PRN
  Administered 2017-06-04: 25 ug via INTRAVENOUS
  Administered 2017-06-04: 50 ug via INTRAVENOUS

## 2017-06-04 MED ORDER — LIDOCAINE 2% (20 MG/ML) 5 ML SYRINGE
INTRAMUSCULAR | Status: AC
  Filled 2017-06-04: qty 5

## 2017-06-04 MED ORDER — GLYCOPYRROLATE 0.2 MG/ML IV SOSY
PREFILLED_SYRINGE | INTRAVENOUS | Status: DC | PRN
  Administered 2017-06-04: .2 mg via INTRAVENOUS

## 2017-06-04 MED ORDER — GLYCOPYRROLATE 0.2 MG/ML IV SOSY
PREFILLED_SYRINGE | INTRAVENOUS | Status: AC
  Filled 2017-06-04: qty 5

## 2017-06-04 MED ORDER — SODIUM CHLORIDE 0.9 % IV SOLN
INTRAVENOUS | Status: DC
  Administered 2017-06-04 (×2): via INTRAVENOUS
  Filled 2017-06-04: qty 1000

## 2017-06-04 MED ORDER — EPHEDRINE 5 MG/ML INJ
INTRAVENOUS | Status: AC
  Filled 2017-06-04: qty 10

## 2017-06-04 MED ORDER — WHITE PETROLATUM EX OINT
TOPICAL_OINTMENT | CUTANEOUS | Status: AC
  Filled 2017-06-04: qty 5

## 2017-06-04 MED ORDER — LIDOCAINE 2% (20 MG/ML) 5 ML SYRINGE
INTRAMUSCULAR | Status: DC | PRN
  Administered 2017-06-04: 60 mg via INTRAVENOUS

## 2017-06-04 MED ORDER — CEFAZOLIN SODIUM-DEXTROSE 2-4 GM/100ML-% IV SOLN
INTRAVENOUS | Status: AC
  Filled 2017-06-04: qty 100

## 2017-06-04 MED ORDER — CEFAZOLIN SODIUM-DEXTROSE 2-4 GM/100ML-% IV SOLN
2.0000 g | INTRAVENOUS | Status: AC
  Administered 2017-06-04: 2 g via INTRAVENOUS
  Filled 2017-06-04: qty 100

## 2017-06-04 MED ORDER — HYDROCODONE-ACETAMINOPHEN 5-325 MG PO TABS: 1.0000 | ORAL_TABLET | ORAL | 0 refills | Status: DC | PRN

## 2017-06-04 MED ORDER — PROPOFOL 10 MG/ML IV BOLUS
INTRAVENOUS | Status: DC | PRN
  Administered 2017-06-04: 140 mg via INTRAVENOUS

## 2017-06-04 SURGICAL SUPPLY — 44 items
BLADE CLIPPER SURG (BLADE) ×3 IMPLANT
BLADE SURG 15 STRL LF DISP TIS (BLADE) ×1 IMPLANT
BLADE SURG 15 STRL SS (BLADE) ×2
BNDG GAUZE ELAST 4 BULKY (GAUZE/BANDAGES/DRESSINGS) ×3 IMPLANT
CANISTER SUCT 3000ML PPV (MISCELLANEOUS) ×3 IMPLANT
CANISTER SUCTION 1200CC (MISCELLANEOUS) IMPLANT
CLEANER CAUTERY TIP 5X5 PAD (MISCELLANEOUS) ×1 IMPLANT
COVER BACK TABLE 60X90IN (DRAPES) ×3 IMPLANT
COVER MAYO STAND STRL (DRAPES) ×3 IMPLANT
DERMABOND ADVANCED (GAUZE/BANDAGES/DRESSINGS) ×2
DERMABOND ADVANCED .7 DNX12 (GAUZE/BANDAGES/DRESSINGS) ×1 IMPLANT
DISSECTOR ROUND CHERRY 3/8 STR (MISCELLANEOUS) IMPLANT
DRAIN PENROSE 18X1/4 LTX STRL (WOUND CARE) IMPLANT
DRAPE LAPAROTOMY 100X72 PEDS (DRAPES) ×3 IMPLANT
ELECT REM PT RETURN 9FT ADLT (ELECTROSURGICAL) ×3
ELECTRODE REM PT RTRN 9FT ADLT (ELECTROSURGICAL) ×1 IMPLANT
GAUZE SPONGE 4X4 16PLY XRAY LF (GAUZE/BANDAGES/DRESSINGS) ×3 IMPLANT
GLOVE BIO SURGEON STRL SZ 6.5 (GLOVE) ×2 IMPLANT
GLOVE BIO SURGEON STRL SZ7.5 (GLOVE) ×3 IMPLANT
GLOVE BIO SURGEONS STRL SZ 6.5 (GLOVE) ×1
GLOVE BIOGEL PI IND STRL 6.5 (GLOVE) ×1 IMPLANT
GLOVE BIOGEL PI IND STRL 7.5 (GLOVE) ×1 IMPLANT
GLOVE BIOGEL PI INDICATOR 6.5 (GLOVE) ×2
GLOVE BIOGEL PI INDICATOR 7.5 (GLOVE) ×2
GOWN STRL REUS W/ TWL XL LVL3 (GOWN DISPOSABLE) IMPLANT
GOWN STRL REUS W/TWL LRG LVL3 (GOWN DISPOSABLE) ×3 IMPLANT
GOWN STRL REUS W/TWL XL LVL3 (GOWN DISPOSABLE) ×3 IMPLANT
KIT RM TURNOVER CYSTO AR (KITS) ×3 IMPLANT
NEEDLE HYPO 22GX1.5 SAFETY (NEEDLE) IMPLANT
NS IRRIG 500ML POUR BTL (IV SOLUTION) ×3 IMPLANT
PACK BASIN DAY SURGERY FS (CUSTOM PROCEDURE TRAY) ×3 IMPLANT
PAD CLEANER CAUTERY TIP 5X5 (MISCELLANEOUS) ×2
PENCIL BUTTON HOLSTER BLD 10FT (ELECTRODE) ×3 IMPLANT
SUPPORT SCROTAL LG STRP (MISCELLANEOUS) ×2 IMPLANT
SUPPORTER ATHLETIC LG (MISCELLANEOUS) ×1
SUT CHROMIC 3 0 SH 27 (SUTURE) ×9 IMPLANT
SUT VIC AB 4-0 PS2 27 (SUTURE) ×3 IMPLANT
SYR CONTROL 10ML LL (SYRINGE) IMPLANT
TOWEL OR 17X24 6PK STRL BLUE (TOWEL DISPOSABLE) ×6 IMPLANT
TRAY DSU PREP LF (CUSTOM PROCEDURE TRAY) ×3 IMPLANT
TUBE CONNECTING 12'X1/4 (SUCTIONS) ×1
TUBE CONNECTING 12X1/4 (SUCTIONS) ×2 IMPLANT
WATER STERILE IRR 500ML POUR (IV SOLUTION) IMPLANT
YANKAUER SUCT BULB TIP NO VENT (SUCTIONS) ×3 IMPLANT

## 2017-06-04 NOTE — Op Note (Signed)
.  Operative Note  Preoperative diagnosis:  1.  Bilateral hydrocele  Postoperative diagnosis: 1.  Bilateral hydrocele  Procedure(s): 1.  Bilateral hydrocelectomy  Surgeon: Link Snuffer, MD  Assistants: none  Anesthesia: Gen.  Complications: none immediate  EBL: 20 mL  Specimens: 1. none  Drains/Catheters: 1. none  Condition following The operation: stable  Intraoperative findings: Bilateral hydrocele  Indication: the patient is a 76 year old man with scrotal swelling found to have a bilateral hydrocele confirmed by ultrasound.  Description of procedure:  The patient was identified and consent was obtained. He was taken back to the operating room and placed in the supine position. He was placed under general anesthesia and prepped and draped in the standard sterile fashion.Perioperative antibiotic was given. Timeout was performed. A 4 cm midline incision was made along the median raphae of the scrotum. This was carried down through the dartos and the right testicle along with the hydrocele sac was delivered onto the operative field. The gubernacular attachments were released with a combination of blunt dissection and electrocautery. Spot electrocautery was used for hemostasis as necessary. Care was taken to not use electrocautery close to the cord itself. The hydrocele sac was incised and fluid was evacuated. I extended the hydrocele sac incision proximally and distally. Excess sac was excised. This was discarded. The hydrocele sac was everted around the testicle and loosely reapproximated with a 3-0 chromic. Hemostasis was obtained carefully with Bovie electrocautery. I irrigated the scrotal cavity and there was good hemostasis. The testicle was delivered back into the scrotum in its proper anatomical position.  I then proceeded to the left side and again delivered the testicle and the hydrocele sac onto the operative field and performed the exact same operation as described on the  right.  Again spot electrocautery was used for hemostasis taking care not to use cautery close to vital structures within the spermatic cord. The dartos was closed with a running 3-0 chromic. The skin was closed with running 4-0 Vicryl sutures. 10 mL of 2% lidocaine without epinephrine was instilled for anesthetic affect. Dermabond was applied and then a dressing along with scrotal support was applied. This concluded the operation. The patient tolerated the procedure well and was stable postoperatively.  Plan: The patient will return in several weeks for postoperative wound check.

## 2017-06-04 NOTE — Anesthesia Procedure Notes (Signed)
Procedure Name: LMA Insertion Date/Time: 06/04/2017 9:27 AM Performed by: Suan Halter, CRNA Pre-anesthesia Checklist: Patient identified, Emergency Drugs available, Suction available and Patient being monitored Patient Re-evaluated:Patient Re-evaluated prior to induction Oxygen Delivery Method: Circle system utilized Preoxygenation: Pre-oxygenation with 100% oxygen Induction Type: IV induction Ventilation: Mask ventilation without difficulty LMA: LMA inserted LMA Size: 4.0 Number of attempts: 1 Airway Equipment and Method: Bite block Placement Confirmation: positive ETCO2 Tube secured with: Tape Dental Injury: Teeth and Oropharynx as per pre-operative assessment

## 2017-06-04 NOTE — Anesthesia Postprocedure Evaluation (Signed)
Anesthesia Post Note  Patient: Caleb Hicks  Procedure(s) Performed: BILATERAL HYDROCELECTOMY ADULT (Bilateral Scrotum)     Patient location during evaluation: PACU Anesthesia Type: General Level of consciousness: awake Pain management: pain level controlled Vital Signs Assessment: post-procedure vital signs reviewed and stable Respiratory status: spontaneous breathing Cardiovascular status: stable Anesthetic complications: no    Last Vitals:  Vitals:   06/04/17 1100 06/04/17 1115  BP: (!) 171/70 (!) 178/65  Pulse: 61 60  Resp: 18 15  Temp:    SpO2: 99% 99%    Last Pain:  Vitals:   06/04/17 1115  TempSrc:   PainSc: 6                  Aisea Bouldin

## 2017-06-04 NOTE — Transfer of Care (Signed)
Immediate Anesthesia Transfer of Care Note  Patient: Caleb Hicks  Procedure(s) Performed: Procedure(s) (LRB): BILATERAL HYDROCELECTOMY ADULT (Bilateral)  Patient Location: PACU  Anesthesia Type: General  Level of Consciousness: awake, oriented, sedated and patient cooperative  Airway & Oxygen Therapy: Patient Spontanous Breathing and Patient connected to face mask oxygen  Post-op Assessment: Report given to PACU RN and Post -op Vital signs reviewed and stable  Post vital signs: Reviewed and stable  Complications: No apparent anesthesia complications Last Vitals:  Vitals:   06/04/17 0716 06/04/17 0839  BP: (!) 208/65 (!) 198/63  Pulse: (!) 51 (!) 49  Resp: 18   Temp: (!) 36.4 C   SpO2: 99%     Last Pain:  Vitals:   06/04/17 0716  TempSrc: Oral      Patients Stated Pain Goal: 7 (06/04/17 0800)

## 2017-06-04 NOTE — Discharge Instructions (Addendum)
Discharge instructions following scrotal surgery  Call your doctor for:  Fever is greater than 100.5  Severe nausea or vomiting  Increasing pain not controlled by pain medication  Increasing redness or drainage from incisions  The number for questions or concerns is [phone]  Activity level: No lifting greater than 20 pounds (about equal to milk) for the next 2 weeks or until cleared to do so at follow-up appointment.  Otherwise activity as tolerated by comfort level.  Diet: May resume your regular diet as tolerated  Driving: No driving while still taking opiate pain medications (weight at least 6-8 hours after last dose).  No driving if you still sore from surgery as it may limit her ability to react quickly if necessary.   Shower/bath: May shower and get incision wet pat dry immediately following.  Do not scrub vigorously for the next 2-3 weeks.  Do not soak incision (ID soaking in bath or swimming) until told he may do so by Dr., as this may promote a wound infection.  Wound care: He may cover wounds with sterile gauze as needed to prevent incisions rubbing on close follow-up in any seepage.  Wear tight fitting underpants/scrotal support for at least 2 weeks.  He should apply cold compresses (ice or sac of frozen peas/corn) to your scrotum for at least 48 hours to reduce the swelling for 15 minutes at a time indirectly.  You should expect that his scrotum will swell up initially and then get smaller over the next 2-4 weeks.  Follow-up appointments: Follow-up appointment will be scheduled with Dr. Gloriann Loan for a wound check.   Post Anesthesia Home Care Instructions  Activity: Get plenty of rest for the remainder of the day. A responsible individual must stay with you for 24 hours following the procedure.  For the next 24 hours, DO NOT: -Drive a car -Paediatric nurse -Drink alcoholic beverages -Take any medication unless instructed by your physician -Make any legal  decisions or sign important papers.  Meals: Start with liquid foods such as gelatin or soup. Progress to regular foods as tolerated. Avoid greasy, spicy, heavy foods. If nausea and/or vomiting occur, drink only clear liquids until the nausea and/or vomiting subsides. Call your physician if vomiting continues.  Special Instructions/Symptoms: Your throat may feel dry or sore from the anesthesia or the breathing tube placed in your throat during surgery. If this causes discomfort, gargle with warm salt water. The discomfort should disappear within 24 hours.  If you had a scopolamine patch placed behind your ear for the management of post- operative nausea and/or vomiting:  1. The medication in the patch is effective for 72 hours, after which it should be removed.  Wrap patch in a tissue and discard in the trash. Wash hands thoroughly with soap and water. 2. You may remove the patch earlier than 72 hours if you experience unpleasant side effects which may include dry mouth, dizziness or visual disturbances. 3. Avoid touching the patch. Wash your hands with soap and water after contact with the patch.

## 2017-06-04 NOTE — Anesthesia Preprocedure Evaluation (Addendum)
Anesthesia Evaluation  Patient identified by MRN, date of birth, ID band Patient awake    Reviewed: Allergy & Precautions, NPO status , Patient's Chart, lab work & pertinent test results  History of Anesthesia Complications (+) Emergence Delirium  Airway Mallampati: II  TM Distance: >3 FB     Dental   Pulmonary neg pulmonary ROS, former smoker,    breath sounds clear to auscultation       Cardiovascular hypertension, + Peripheral Vascular Disease   Rhythm:Regular Rate:Normal     Neuro/Psych    GI/Hepatic negative GI ROS, Neg liver ROS,   Endo/Other  negative endocrine ROS  Renal/GU Renal disease     Musculoskeletal   Abdominal   Peds  Hematology   Anesthesia Other Findings   Reproductive/Obstetrics                            Anesthesia Physical Anesthesia Plan  ASA: III  Anesthesia Plan: General   Post-op Pain Management:    Induction: Intravenous  PONV Risk Score and Plan: 2 and Ondansetron, Dexamethasone and Treatment may vary due to age or medical condition  Airway Management Planned: LMA and Oral ETT  Additional Equipment:   Intra-op Plan:   Post-operative Plan: Extubation in OR  Informed Consent: I have reviewed the patients History and Physical, chart, labs and discussed the procedure including the risks, benefits and alternatives for the proposed anesthesia with the patient or authorized representative who has indicated his/her understanding and acceptance.   Dental advisory given  Plan Discussed with: CRNA and Anesthesiologist  Anesthesia Plan Comments:         Anesthesia Quick Evaluation

## 2017-06-04 NOTE — Interval H&P Note (Signed)
History and Physical Interval Note:  06/04/2017 9:05 AM  Caleb Hicks  has presented today for surgery, with the diagnosis of BILATERAL HYDROCELE  The various methods of treatment have been discussed with the patient and family. After consideration of risks, benefits and other options for treatment, the patient has consented to  Procedure(s): BILATERAL HYDROCELECTOMY ADULT (Bilateral) as a surgical intervention .  The patient's history has been reviewed, patient examined, no change in status, stable for surgery.  I have reviewed the patient's chart and labs.  Questions were answered to the patient's satisfaction.     Marton Redwood, III

## 2017-06-05 ENCOUNTER — Encounter (HOSPITAL_BASED_OUTPATIENT_CLINIC_OR_DEPARTMENT_OTHER): Payer: Self-pay | Admitting: Urology

## 2017-11-07 ENCOUNTER — Other Ambulatory Visit: Payer: Self-pay

## 2017-11-07 ENCOUNTER — Emergency Department (HOSPITAL_COMMUNITY): Payer: Medicare Other

## 2017-11-07 ENCOUNTER — Encounter (HOSPITAL_COMMUNITY): Payer: Self-pay

## 2017-11-07 ENCOUNTER — Inpatient Hospital Stay (HOSPITAL_COMMUNITY)
Admission: EM | Admit: 2017-11-07 | Discharge: 2017-11-24 | DRG: 871 | Disposition: A | Discharge status: Home Health | Payer: Medicare Other | Attending: Internal Medicine | Admitting: Internal Medicine

## 2017-11-07 DIAGNOSIS — M109 Gout, unspecified: Secondary | ICD-10-CM | POA: Diagnosis present

## 2017-11-07 DIAGNOSIS — N184 Chronic kidney disease, stage 4 (severe): Secondary | ICD-10-CM | POA: Diagnosis present

## 2017-11-07 DIAGNOSIS — K5732 Diverticulitis of large intestine without perforation or abscess without bleeding: Secondary | ICD-10-CM | POA: Diagnosis present

## 2017-11-07 DIAGNOSIS — E875 Hyperkalemia: Secondary | ICD-10-CM | POA: Diagnosis present

## 2017-11-07 DIAGNOSIS — Z66 Do not resuscitate: Secondary | ICD-10-CM | POA: Diagnosis present

## 2017-11-07 DIAGNOSIS — R6510 Systemic inflammatory response syndrome (SIRS) of non-infectious origin without acute organ dysfunction: Secondary | ICD-10-CM

## 2017-11-07 DIAGNOSIS — R001 Bradycardia, unspecified: Secondary | ICD-10-CM | POA: Diagnosis not present

## 2017-11-07 DIAGNOSIS — E860 Dehydration: Secondary | ICD-10-CM | POA: Diagnosis not present

## 2017-11-07 DIAGNOSIS — T447X5A Adverse effect of beta-adrenoreceptor antagonists, initial encounter: Secondary | ICD-10-CM | POA: Diagnosis not present

## 2017-11-07 DIAGNOSIS — N179 Acute kidney failure, unspecified: Secondary | ICD-10-CM

## 2017-11-07 DIAGNOSIS — N189 Chronic kidney disease, unspecified: Secondary | ICD-10-CM

## 2017-11-07 DIAGNOSIS — Z91048 Other nonmedicinal substance allergy status: Secondary | ICD-10-CM

## 2017-11-07 DIAGNOSIS — Z79899 Other long term (current) drug therapy: Secondary | ICD-10-CM

## 2017-11-07 DIAGNOSIS — A084 Viral intestinal infection, unspecified: Secondary | ICD-10-CM | POA: Diagnosis present

## 2017-11-07 DIAGNOSIS — Z87891 Personal history of nicotine dependence: Secondary | ICD-10-CM

## 2017-11-07 DIAGNOSIS — A419 Sepsis, unspecified organism: Secondary | ICD-10-CM | POA: Diagnosis not present

## 2017-11-07 DIAGNOSIS — Z7952 Long term (current) use of systemic steroids: Secondary | ICD-10-CM

## 2017-11-07 DIAGNOSIS — R531 Weakness: Secondary | ICD-10-CM

## 2017-11-07 DIAGNOSIS — Z940 Kidney transplant status: Secondary | ICD-10-CM | POA: Diagnosis not present

## 2017-11-07 DIAGNOSIS — D631 Anemia in chronic kidney disease: Secondary | ICD-10-CM | POA: Diagnosis present

## 2017-11-07 DIAGNOSIS — I130 Hypertensive heart and chronic kidney disease with heart failure and stage 1 through stage 4 chronic kidney disease, or unspecified chronic kidney disease: Secondary | ICD-10-CM | POA: Diagnosis present

## 2017-11-07 DIAGNOSIS — D509 Iron deficiency anemia, unspecified: Secondary | ICD-10-CM | POA: Diagnosis not present

## 2017-11-07 DIAGNOSIS — A4152 Sepsis due to Pseudomonas: Secondary | ICD-10-CM | POA: Diagnosis not present

## 2017-11-07 DIAGNOSIS — I5032 Chronic diastolic (congestive) heart failure: Secondary | ICD-10-CM | POA: Diagnosis present

## 2017-11-07 DIAGNOSIS — K529 Noninfective gastroenteritis and colitis, unspecified: Secondary | ICD-10-CM

## 2017-11-07 DIAGNOSIS — E785 Hyperlipidemia, unspecified: Secondary | ICD-10-CM

## 2017-11-07 DIAGNOSIS — R0602 Shortness of breath: Secondary | ICD-10-CM

## 2017-11-07 DIAGNOSIS — I714 Abdominal aortic aneurysm, without rupture: Secondary | ICD-10-CM | POA: Diagnosis present

## 2017-11-07 DIAGNOSIS — E2749 Other adrenocortical insufficiency: Secondary | ICD-10-CM | POA: Diagnosis present

## 2017-11-07 DIAGNOSIS — Z888 Allergy status to other drugs, medicaments and biological substances status: Secondary | ICD-10-CM

## 2017-11-07 DIAGNOSIS — I10 Essential (primary) hypertension: Secondary | ICD-10-CM | POA: Diagnosis present

## 2017-11-07 DIAGNOSIS — N170 Acute kidney failure with tubular necrosis: Secondary | ICD-10-CM | POA: Diagnosis present

## 2017-11-07 DIAGNOSIS — I509 Heart failure, unspecified: Secondary | ICD-10-CM

## 2017-11-07 LAB — CBC WITH DIFFERENTIAL/PLATELET
Basophils Absolute: 0 10*3/uL (ref 0.0–0.1)
Basophils Relative: 0 %
EOS PCT: 0 %
Eosinophils Absolute: 0 10*3/uL (ref 0.0–0.7)
HEMATOCRIT: 36.4 % — AB (ref 39.0–52.0)
Hemoglobin: 11.6 g/dL — ABNORMAL LOW (ref 13.0–17.0)
LYMPHS ABS: 0.3 10*3/uL — AB (ref 0.7–4.0)
LYMPHS PCT: 2 %
MCH: 30.1 pg (ref 26.0–34.0)
MCHC: 31.9 g/dL (ref 30.0–36.0)
MCV: 94.5 fL (ref 78.0–100.0)
MONO ABS: 0.3 10*3/uL (ref 0.1–1.0)
Monocytes Relative: 1 %
Neutro Abs: 18.2 10*3/uL — ABNORMAL HIGH (ref 1.7–7.7)
Neutrophils Relative %: 97 %
PLATELETS: 242 10*3/uL (ref 150–400)
RBC: 3.85 MIL/uL — AB (ref 4.22–5.81)
RDW: 15.1 % (ref 11.5–15.5)
WBC: 18.9 10*3/uL — ABNORMAL HIGH (ref 4.0–10.5)

## 2017-11-07 LAB — APTT: aPTT: 22 seconds — ABNORMAL LOW (ref 24–36)

## 2017-11-07 LAB — URINALYSIS, ROUTINE W REFLEX MICROSCOPIC
BILIRUBIN URINE: NEGATIVE
Glucose, UA: NEGATIVE mg/dL
Ketones, ur: NEGATIVE mg/dL
Leukocytes, UA: NEGATIVE
Nitrite: NEGATIVE
Protein, ur: 100 mg/dL — AB
SPECIFIC GRAVITY, URINE: 1.01 (ref 1.005–1.030)
pH: 5 (ref 5.0–8.0)

## 2017-11-07 LAB — COMPREHENSIVE METABOLIC PANEL
ALT: 12 U/L — ABNORMAL LOW (ref 17–63)
AST: 25 U/L (ref 15–41)
Albumin: 2.8 g/dL — ABNORMAL LOW (ref 3.5–5.0)
Alkaline Phosphatase: 61 U/L (ref 38–126)
Anion gap: 14 (ref 5–15)
BUN: 78 mg/dL — AB (ref 6–20)
CHLORIDE: 107 mmol/L (ref 101–111)
CO2: 20 mmol/L — ABNORMAL LOW (ref 22–32)
Calcium: 9.1 mg/dL (ref 8.9–10.3)
Creatinine, Ser: 2.54 mg/dL — ABNORMAL HIGH (ref 0.61–1.24)
GFR calc Af Amer: 26 mL/min — ABNORMAL LOW (ref >60)
GFR calc non Af Amer: 23 mL/min — ABNORMAL LOW (ref >60)
GLUCOSE: 161 mg/dL — AB (ref 65–99)
POTASSIUM: 4.4 mmol/L (ref 3.5–5.1)
SODIUM: 141 mmol/L (ref 135–145)
Total Bilirubin: 0.5 mg/dL (ref 0.3–1.2)
Total Protein: 6.2 g/dL — ABNORMAL LOW (ref 6.5–8.1)

## 2017-11-07 LAB — I-STAT TROPONIN, ED: TROPONIN I, POC: 0.02 ng/mL (ref 0.00–0.08)

## 2017-11-07 LAB — I-STAT CG4 LACTIC ACID, ED: LACTIC ACID, VENOUS: 3.93 mmol/L — AB (ref 0.5–1.9)

## 2017-11-07 LAB — LIPASE, BLOOD: LIPASE: 33 U/L (ref 11–51)

## 2017-11-07 LAB — PROTIME-INR
INR: 1.08
PROTHROMBIN TIME: 13.9 s (ref 11.4–15.2)

## 2017-11-07 LAB — LACTIC ACID, PLASMA
LACTIC ACID, VENOUS: 2.9 mmol/L — AB (ref 0.5–1.9)
Lactic Acid, Venous: 4.3 mmol/L (ref 0.5–1.9)
Lactic Acid, Venous: 2.7 mmol/L (ref 0.5–1.9)

## 2017-11-07 LAB — PROCALCITONIN: Procalcitonin: 127.52 ng/mL

## 2017-11-07 LAB — CREATININE, URINE, RANDOM: Creatinine, Urine: 91.46 mg/dL

## 2017-11-07 LAB — SODIUM, URINE, RANDOM: SODIUM UR: 55 mmol/L

## 2017-11-07 MED ORDER — ACETAMINOPHEN 325 MG PO TABS
650.0000 mg | ORAL_TABLET | Freq: Four times a day (QID) | ORAL | Status: DC | PRN
  Administered 2017-11-11: 650 mg via ORAL
  Filled 2017-11-07: qty 2

## 2017-11-07 MED ORDER — ONDANSETRON HCL 4 MG PO TABS: 4.0000 mg | ORAL_TABLET | Freq: Four times a day (QID) | ORAL | Status: DC | PRN

## 2017-11-07 MED ORDER — ATORVASTATIN CALCIUM 20 MG PO TABS
20.0000 mg | ORAL_TABLET | Freq: Every day | ORAL | Status: DC
  Administered 2017-11-08 – 2017-11-09 (×2): 20 mg via ORAL
  Filled 2017-11-07 (×2): qty 1

## 2017-11-07 MED ORDER — ONDANSETRON HCL 4 MG/2ML IJ SOLN: 4.0000 mg | Freq: Four times a day (QID) | INTRAMUSCULAR | Status: DC | PRN

## 2017-11-07 MED ORDER — SODIUM CHLORIDE 0.9 % IV SOLN
Freq: Once | INTRAVENOUS | Status: AC
  Administered 2017-11-07: 19:00:00 via INTRAVENOUS

## 2017-11-07 MED ORDER — ENOXAPARIN SODIUM 30 MG/0.3ML ~~LOC~~ SOLN
30.0000 mg | Freq: Every day | SUBCUTANEOUS | Status: DC
  Administered 2017-11-07: 30 mg via SUBCUTANEOUS
  Filled 2017-11-07: qty 0.3

## 2017-11-07 MED ORDER — PIPERACILLIN-TAZOBACTAM 3.375 G IVPB 30 MIN
3.3750 g | Freq: Once | INTRAVENOUS | Status: AC
  Administered 2017-11-07: 3.375 g via INTRAVENOUS
  Filled 2017-11-07: qty 50

## 2017-11-07 MED ORDER — VANCOMYCIN HCL IN DEXTROSE 1-5 GM/200ML-% IV SOLN: 1000.0000 mg | Freq: Two times a day (BID) | INTRAVENOUS | Status: DC

## 2017-11-07 MED ORDER — PIPERACILLIN-TAZOBACTAM 3.375 G IVPB
3.3750 g | Freq: Three times a day (TID) | INTRAVENOUS | Status: DC
Start: 2017-11-07 — End: 2017-11-08
  Administered 2017-11-07 – 2017-11-08 (×2): 3.375 g via INTRAVENOUS
  Filled 2017-11-07 (×3): qty 50

## 2017-11-07 MED ORDER — VANCOMYCIN HCL IN DEXTROSE 1-5 GM/200ML-% IV SOLN
1000.0000 mg | Freq: Once | INTRAVENOUS | Status: AC
  Administered 2017-11-07: 1000 mg via INTRAVENOUS
  Filled 2017-11-07: qty 200

## 2017-11-07 MED ORDER — PREDNISONE 5 MG PO TABS
5.0000 mg | ORAL_TABLET | Freq: Every day | ORAL | Status: DC
  Administered 2017-11-07 – 2017-11-08 (×2): 5 mg via ORAL
  Filled 2017-11-07 (×2): qty 1

## 2017-11-07 MED ORDER — CYCLOSPORINE MODIFIED (NEORAL) 25 MG PO CAPS
75.0000 mg | ORAL_CAPSULE | Freq: Every day | ORAL | Status: DC
  Administered 2017-11-08 – 2017-11-24 (×17): 75 mg via ORAL
  Filled 2017-11-07 (×17): qty 3

## 2017-11-07 MED ORDER — VANCOMYCIN HCL IN DEXTROSE 1-5 GM/200ML-% IV SOLN: 1000.0000 mg | INTRAVENOUS | Status: DC

## 2017-11-07 MED ORDER — SODIUM CHLORIDE 0.9 % IV BOLUS (SEPSIS)
1000.0000 mL | Freq: Once | INTRAVENOUS | Status: AC
  Administered 2017-11-07: 1000 mL via INTRAVENOUS

## 2017-11-07 MED ORDER — CYCLOSPORINE MODIFIED (NEORAL) 25 MG PO CAPS
50.0000 mg | ORAL_CAPSULE | Freq: Every day | ORAL | Status: DC
  Administered 2017-11-07 – 2017-11-23 (×17): 50 mg via ORAL
  Filled 2017-11-07 (×17): qty 2

## 2017-11-07 MED ORDER — METOPROLOL TARTRATE 25 MG PO TABS
50.0000 mg | ORAL_TABLET | Freq: Two times a day (BID) | ORAL | Status: DC
  Administered 2017-11-07 – 2017-11-09 (×4): 50 mg via ORAL
  Filled 2017-11-07 (×4): qty 2

## 2017-11-07 MED ORDER — ALLOPURINOL 100 MG PO TABS
100.0000 mg | ORAL_TABLET | Freq: Two times a day (BID) | ORAL | Status: DC
  Administered 2017-11-08 – 2017-11-09 (×4): 100 mg via ORAL
  Filled 2017-11-07 (×5): qty 1

## 2017-11-07 MED ORDER — ACETAMINOPHEN 325 MG PO TABS
650.0000 mg | ORAL_TABLET | Freq: Once | ORAL | Status: AC
  Administered 2017-11-07: 650 mg via ORAL
  Filled 2017-11-07: qty 2

## 2017-11-07 MED ORDER — ONDANSETRON HCL 4 MG/2ML IJ SOLN
4.0000 mg | Freq: Once | INTRAMUSCULAR | Status: AC
  Administered 2017-11-07: 4 mg via INTRAVENOUS
  Filled 2017-11-07: qty 2

## 2017-11-07 MED ORDER — SODIUM CHLORIDE 0.9 % IV SOLN
INTRAVENOUS | Status: DC
  Administered 2017-11-07 – 2017-11-08 (×2): via INTRAVENOUS

## 2017-11-07 MED ORDER — ACETAMINOPHEN 650 MG RE SUPP: 650.0000 mg | Freq: Four times a day (QID) | RECTAL | Status: DC | PRN

## 2017-11-07 NOTE — ED Notes (Signed)
Date and time results received: 11/07/17 4:57 PM   Test: istat lactic Critical Value: 3.93 Name of Provider Notified: Messick  Orders Received? Or Actions Taken?: Informed MD Francia Greaves and RN

## 2017-11-07 NOTE — ED Notes (Signed)
ED TO INPATIENT HANDOFF REPORT  Name/Age/Gender Caleb Hicks 77 y.o. male  Code Status    Code Status Orders  (From admission, onward)        Start     Ordered   11/07/17 2101  Do not attempt resuscitation (DNR)  Continuous    Question Answer Comment  In the event of cardiac or respiratory ARREST Do not call a "code blue"   In the event of cardiac or respiratory ARREST Do not perform Intubation, CPR, defibrillation or ACLS   In the event of cardiac or respiratory ARREST Use medication by any route, position, wound care, and other measures to relive pain and suffering. May use oxygen, suction and manual treatment of airway obstruction as needed for comfort.      11/07/17 2100    Code Status History    Date Active Date Inactive Code Status Order ID Comments User Context   03/24/2017 1738 03/28/2017 1635 Full Code [id number]  Eugenie Filler, MD ED    Advance Directive Documentation     Most Recent Value  Type of Advance Directive  Healthcare Power of [place], Living will  Pre-existing out of facility DNR order (yellow form or pink MOST form)  -  "MOST" Form in Place?  -      Home/SNF/Other Home  Chief Complaint Weakness   Level of Care/Admitting Diagnosis ED Disposition    ED Disposition Condition [place]: [place] [100102]  Level of Care: Telemetry [5]  Admit to tele based on following criteria: Other see comments  Comments: sepsis  Diagnosis: Sepsis [facility]) [[id number]]  Admitting Physician: Toy Baker [[id number]]  Attending Physician: Toy Baker [[id number]]  PT Class (Do Not Modify): Observation [104]  PT Acc Code (Do Not Modify): Observation [10022]       Medical History Past Medical History:  Diagnosis Date  . AAA (abdominal aortic aneurysm) ([facility]): 4 cm 03/24/2017   3.5X 3. 5 CM PER 10-6 ABDOMINAL CT FOLOOWED EVRY 2 YEARS   . Abdominal aneurysm ([facility])   . Diverticulitis 03/2017   1 UNIT BLOOD GIVEN   . Gout   . Hepatic cyst: Solitary 03/24/2017  . Hydrocele sac   . Hyperlipidemia 03/24/2017  . Hypertension   . Hyperuricemia 03/24/2017  . 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 Norvasc [Amlodipine Besylate]     Pt state it cause him weight gain  . Tape Other (See Comments)    IV Location/Drains/Wounds Patient Lines/Drains/Airways Status   Active Line/Drains/Airways    Name:   Placement date:   Placement time:   Site:   Days:   Peripheral IV 11/07/17 Left Antecubital   11/07/17    -    Antecubital   less than 1   Peripheral IV 11/07/17 Right Forearm   11/07/17    2213    Forearm   less than 1   Incision (Closed) 06/04/17 Scrotum   06/04/17    0944     156          Labs/Imaging Results for orders placed or performed during the hospital encounter of 11/07/17 (from the past 48 hour(s))  Comprehensive metabolic panel     Status: Abnormal   Collection Time: 11/07/17  4:39 PM  Result Value Ref Range   Sodium 141 135 - 145 mmol/L   Potassium 4.4 3.5 - 5.1 mmol/L  Chloride 107 101 - 111 mmol/L   CO2 20 (L) 22 - 32 mmol/L   Glucose, Bld 161 (H) 65 - 99 mg/dL   BUN 78 (H) 6 - 20 mg/dL   Creatinine, Ser 2.54 (H) 0.61 - 1.24 mg/dL   Calcium 9.1 8.9 - 10.3 mg/dL   Total Protein 6.2 (L) 6.5 - 8.1 g/dL   Albumin 2.8 (L) 3.5 - 5.0 g/dL   AST 25 15 - 41 U/L   ALT 12 (L) 17 - 63 U/L   Alkaline Phosphatase 61 38 - 126 U/L   Total Bilirubin 0.5 0.3 - 1.2 mg/dL   GFR calc non Af Amer 23 (L) >60 mL/min   GFR calc Af Amer 26 (L) >60 mL/min    Comment: (NOTE) The eGFR has been calculated using the CKD EPI equation. This calculation has not been validated in all clinical situations. eGFR's persistently <60 mL/min signify possible Chronic Kidney Disease.    Anion gap 14 5 - 15    Comment: Performed at [facility], [place] [address] [place], [place] [address]  CBC  WITH DIFFERENTIAL     Status: Abnormal   Collection Time: 11/07/17  4:39 PM  Result Value Ref Range   WBC 18.9 (H) 4.0 - 10.5 K/uL   RBC 3.85 (L) 4.22 - 5.81 MIL/uL   Hemoglobin 11.6 (L) 13.0 - 17.0 g/dL   HCT 36.4 (L) 39.0 - 52.0 %   MCV 94.5 78.0 - 100.0 fL   MCH 30.1 26.0 - 34.0 pg   MCHC 31.9 30.0 - 36.0 g/dL   RDW 15.1 11.5 - 15.5 %   Platelets 242 150 - 400 K/uL   Neutrophils Relative % 97 %   Neutro Abs 18.2 (H) 1.7 - 7.7 K/uL   Lymphocytes Relative 2 %   Lymphs Abs 0.3 (L) 0.7 - 4.0 K/uL   Monocytes Relative 1 %   Monocytes Absolute 0.3 0.1 - 1.0 K/uL   Eosinophils Relative 0 %   Eosinophils Absolute 0.0 0.0 - 0.7 K/uL   Basophils Relative 0 %   Basophils Absolute 0.0 0.0 - 0.1 K/uL    Comment: Performed at [facility], [place] [address], [place], [place] [address]  Lipase, blood     Status: None   Collection Time: 11/07/17  4:39 PM  Result Value Ref Range   Lipase 33 11 - 51 U/L    Comment: Performed at [facility], [place] [address] [place], [place] [address]  I-stat troponin, ED (not at [facility], [facility])     Status: None   Collection Time: 11/07/17  4:49 PM  Result Value Ref Range   Troponin i, poc 0.02 0.00 - 0.08 ng/mL   Comment 3            Comment: Due to the release kinetics of cTnI, a negative result within the first hours of the onset of symptoms does not rule out myocardial infarction with certainty. If myocardial infarction is still suspected, repeat the test at appropriate intervals.   I-Stat CG4 Lactic Acid, ED  (not at  [facility])     Status: Abnormal   Collection Time: 11/07/17  4:51 PM  Result Value Ref Range   Lactic Acid, Venous 3.93 (HH) 0.5 - 1.9 mmol/L   Comment NOTIFIED PHYSICIAN   Urinalysis, Routine w reflex microscopic     Status: Abnormal   Collection Time: 11/07/17  5:15 PM  Result Value Ref Range   Color, Urine YELLOW YELLOW  APPearance CLEAR CLEAR   Specific Gravity, Urine 1.010 1.005 - 1.030   pH 5.0 5.0  - 8.0   Glucose, UA NEGATIVE NEGATIVE mg/dL   Hgb urine dipstick MODERATE (A) NEGATIVE   Bilirubin Urine NEGATIVE NEGATIVE   Ketones, ur NEGATIVE NEGATIVE mg/dL   Protein, ur 100 (A) NEGATIVE mg/dL   Nitrite NEGATIVE NEGATIVE   Leukocytes, UA NEGATIVE NEGATIVE   RBC / HPF 21-50 0 - 5 RBC/hpf   WBC, UA 0-5 0 - 5 WBC/hpf   Bacteria, UA RARE (A) NONE SEEN   Squamous Epithelial / LPF 0-5 0 - 5   Mucus PRESENT    Hyaline Casts, UA PRESENT     Comment: Performed at [facility], [place] [address], [place], [place] [address]  Lactic acid, plasma     Status: Abnormal   Collection Time: 11/07/17  5:15 PM  Result Value Ref Range   Lactic Acid, Venous 4.3 (HH) 0.5 - 1.9 mmol/L    Comment: CRITICAL RESULT CALLED TO, READ BACK BY AND VERIFIED WITH: BRILL,M AT 1802 ON 11/07/2017 BY MOSLEY,J Performed at [facility], [place] [address] [place], [place] [address]   Lactic acid, plasma     Status: Abnormal   Collection Time: 11/07/17  7:04 PM  Result Value Ref Range   Lactic Acid, Venous 2.9 (HH) 0.5 - 1.9 mmol/L    Comment: CRITICAL RESULT CALLED TO, READ BACK BY AND VERIFIED WITH: COLES,L AT 1953 ON 11/07/2017 BY MOSLEY,J Performed at [facility], [place] [address] [place], [place] [address]    Dg Chest Port 1 View  Result Date: 11/07/2017 CLINICAL DATA:  Generalized weakness. EXAM: PORTABLE CHEST 1 VIEW COMPARISON:  03/24/2017 FINDINGS: The heart size and mediastinal contours are within normal limits. Stable mild tortuosity of the thoracic aorta. There is no evidence of pulmonary edema, consolidation, pneumothorax, nodule or pleural fluid. The visualized skeletal structures are unremarkable. IMPRESSION: No active disease. Electronically Signed   By: Aletta Edouard M.D.   On: 11/07/2017 17:15    Pending Labs Unresulted Labs (From admission, onward)   Start     Ordered   11/08/17 0800  Cyclosporine  Tomorrow morning,   R    Comments:   Cyclosporine trough obtain prior to the dose given    11/07/17 2100   11/08/17 0500  Creatinine, serum  Daily,   R     11/07/17 1943   11/08/17 0500  Magnesium  Tomorrow morning,   R    Comments:  Call MD if <1.5    11/07/17 2100   11/08/17 0500  Phosphorus  Tomorrow morning,   R     11/07/17 2100   11/08/17 0500  TSH  Once,   R    Comments:  Cancel if already done within 1 month and notify MD    11/07/17 2100   11/08/17 0500  Comprehensive metabolic panel  Once,   R    Comments:  Cal MD for K<3.5 or >5.0    11/07/17 2100   11/08/17 0500  CBC  Once,   R    Comments:  Call for hg <8.0    11/07/17 2100   11/07/17 2230  Lactic acid, plasma  Now then every 3 hours,   STAT     11/07/17 1920   11/07/17 2101  Procalcitonin  STAT,   R     11/07/17 2100   11/07/17 2101  Protime-INR  STAT,   R  11/07/17 2100   11/07/17 2101  APTT  STAT,   R     11/07/17 2100   11/07/17 2101  Gastrointestinal Panel by PCR , Stool  (Gastrointestinal Panel by PCR, Stool)  Once,   R     11/07/17 2100   11/07/17 2101  Norovirus group 1 & 2 by PCR, stool  (Norovirus group 1 & 2 by PCR, stool panel)  Once,   R     11/07/17 2100   11/07/17 2042  Sodium, urine, random  Once,   R     11/07/17 2041   11/07/17 2042  Creatinine, urine, random  Once,   R     11/07/17 2041   11/07/17 1633  Urine culture  STAT,   STAT     11/07/17 1634   11/07/17 1632  Blood Culture (routine x 2)  BLOOD CULTURE X 2,   STAT     11/07/17 1634      Vitals/Pain Today's Vitals   11/07/17 2100 11/07/17 2104 11/07/17 2130 11/07/17 2156  BP: 132/74 132/74 130/68   Pulse: 78 80 78   Resp: (!) 26 (!) 29 (!) 28   Temp:      TempSrc:      SpO2: 98% 99% 99%   Weight:      Height:      PainSc:    3     Isolation Precautions Enteric precautions (UV disinfection)  Medications Medications  vancomycin (VANCOCIN) IVPB 1000 mg/200 mL premix (has no administration in time range)  piperacillin-tazobactam (ZOSYN) IVPB 3.375 g  (has no administration in time range)  allopurinol (ZYLOPRIM) tablet 100 mg (has no administration in time range)  atorvastatin (LIPITOR) tablet 20 mg (has no administration in time range)  metoprolol tartrate (LOPRESSOR) tablet 50 mg (has no administration in time range)  cycloSPORINE modified (NEORAL) capsule 50-75 mg (has no administration in time range)  predniSONE (DELTASONE) tablet 5 mg (has no administration in time range)  acetaminophen (TYLENOL) tablet 650 mg (has no administration in time range)    Or  acetaminophen (TYLENOL) suppository 650 mg (has no administration in time range)  ondansetron (ZOFRAN) tablet 4 mg (has no administration in time range)    Or  ondansetron (ZOFRAN) injection 4 mg (has no administration in time range)  enoxaparin (LOVENOX) injection 30 mg (has no administration in time range)  0.9 %  sodium chloride infusion ( Intravenous New Bag/Given 11/07/17 2158)  sodium chloride 0.9 % bolus 1,000 mL (0 mLs Intravenous Stopped 11/07/17 1934)  ondansetron (ZOFRAN) injection 4 mg (4 mg Intravenous Given 11/07/17 1705)  acetaminophen (TYLENOL) tablet 650 mg (650 mg Oral Given 11/07/17 1701)  piperacillin-tazobactam (ZOSYN) IVPB 3.375 g (0 g Intravenous Stopped 11/07/17 1735)  vancomycin (VANCOCIN) IVPB 1000 mg/200 mL premix (0 mg Intravenous Stopped 11/07/17 1803)  0.9 %  sodium chloride infusion ( Intravenous Stopped 11/07/17 2157)    Mobility walks

## 2017-11-07 NOTE — ED Notes (Signed)
Gave report to Jeanette Caprice, Therapist, sports for room [id number]

## 2017-11-07 NOTE — ED Triage Notes (Signed)
Pt states generalized weakness, vomiting, diarrhea since 0700. Lives independently. Pt supposed to go to lunch with family, family became nervous that he didn't answer. Pt found sitting on the floor, pt states since "the sun came up", due to weakness.

## 2017-11-07 NOTE — Progress Notes (Signed)
Pharmacy Antibiotic Note  Caleb Hicks is a 77 y.o. male with hx renal transplant presented to the ED on 11/07/2017 with c/o weakness, vomiting and diarrhea.  To start vancomycin and zosyn for suspected sepsis.  - wbc 18.9, scr 2.54 (crcl~23), LA 4.3   Plan: - vancomycin 1000 mg IV q48h for est AUC 441 - zosyn 3.375 gm IV q8h (infuse over 4 hrs) - monitor renal function closely - daily scr  ________________________________  Height: 5\' 7"  (170.2 cm) Weight: 165 lb (74.8 kg) IBW/kg (Calculated) : 66.1  Temp (24hrs), Avg:99.3 F (37.4 C), Min:98.6 F (37 C), Max:100 F (37.8 C)  Recent Labs  Lab 11/07/17 1639 11/07/17 1651 11/07/17 1715  WBC 18.9*  --   --   CREATININE 2.54*  --   --   LATICACIDVEN  --  3.93* 4.3*    Estimated Creatinine Clearance: 22.8 mL/min (A) (by C-G formula based on SCr of 2.54 mg/dL (H)).    Allergies  Allergen Reactions  . Norvasc [Amlodipine Besylate]     Pt state it cause him weight gain  . Tape Other (See Comments)    Thank you for allowing pharmacy to be a part of this patient's care.  Lynelle Doctor 11/07/2017 7:33 PM

## 2017-11-07 NOTE — Progress Notes (Signed)
CRITICAL VALUE ALERT  Critical Value:  Lactic Acid 2.7  Date & Time Notified: 11/07/17 2319  Provider Notified: A.Doutova via [employer]

## 2017-11-07 NOTE — ED Notes (Signed)
Bed: WA17 Expected date:  Expected time:  Means of arrival:  Comments: EMS/N/V/D

## 2017-11-07 NOTE — H&P (Signed)
Caleb Hicks WUJ:[id number] DOB: 09/13/1940 DOA: 11/07/2017     PCP: Patient, No Pcp Per   Outpatient Specialists:     NEphrology:  Dr. Margie Ege   Patient arrived to ER on 11/07/17 at 1559  Patient coming from:    home Lives alone,        Chief Complaint:  Chief Complaint  Patient presents with  . Weakness    HPI: Caleb Hicks is a 77 y.o. male with medical history significant of CKD stage 3,sp renal transplant, IgA nephropathy, HTN, HLD, AAA     Presented with nausea dry heaving diarrhea low-grade fever associated generalized fatigue started this morning with Shacking chills.  he was feeling fine yesterday.  Have had one soft  bowel movement.  No abdominal pain no vomiting blood. Denies lightheadedness but was too weak to get up. No sick contacts. He ate regular food and no one else got sick who ate same food. Reports mild cough.  Illness only started this morning he was so tired he could not get off the floor.  Supposed to go out with for entry of the family when they could not reach him they came to his house and found him sitting in the floor unable to get up. No associated chest pain or shortness of breath.  Regarding pertinent Chronic problems: renal transplant at [facility] 20 years ago currently followed by nephrology at [facility] states compliant with home medications   While in ER: Meeting sepsis criteria given IV fluids and started on broad-spectrum antibiotics initial lactic acid 4  Following Medications were ordered in ER: Medications  0.9 %  sodium chloride infusion (has no administration in time range)  sodium chloride 0.9 % bolus 1,000 mL (1,000 mLs Intravenous New Bag/Given 11/07/17 1703)  ondansetron (ZOFRAN) injection 4 mg (4 mg Intravenous Given 11/07/17 1705)  acetaminophen (TYLENOL) tablet 650 mg (650 mg Oral Given 11/07/17 1701)  piperacillin-tazobactam (ZOSYN) IVPB 3.375 g (0 g Intravenous Stopped 11/07/17 1735)  vancomycin (VANCOCIN) IVPB 1000  mg/200 mL premix (0 mg Intravenous Stopped 11/07/17 1803)    Significant initial  Findings: Abnormal Labs Reviewed  COMPREHENSIVE METABOLIC PANEL - Abnormal; Notable for the following components:      Result Value   CO2 20 (*)    Glucose, Bld 161 (*)    BUN 78 (*)    Creatinine, Ser 2.54 (*)    Total Protein 6.2 (*)    Albumin 2.8 (*)    ALT 12 (*)    GFR calc non Af Amer 23 (*)    GFR calc Af Amer 26 (*)    All other components within normal limits  CBC WITH DIFFERENTIAL/PLATELET - Abnormal; Notable for the following components:   WBC 18.9 (*)    RBC 3.85 (*)    Hemoglobin 11.6 (*)    HCT 36.4 (*)    Neutro Abs 18.2 (*)    Lymphs Abs 0.3 (*)    All other components within normal limits  URINALYSIS, ROUTINE W REFLEX MICROSCOPIC - Abnormal; Notable for the following components:   Hgb urine dipstick MODERATE (*)    Protein, ur 100 (*)    Bacteria, UA RARE (*)    All other components within normal limits  LACTIC ACID, PLASMA - Abnormal; Notable for the following components:   Lactic Acid, Venous 4.3 (*)    All other components within normal limits  I-STAT CG4 LACTIC ACID, ED - Abnormal; Notable for the following components:   Lactic  Acid, Venous 3.93 (*)    All other components within normal limits     Na 141 K 4.4  Cr Up from baseline see below Lab Results  Component Value Date   CREATININE 2.54 (H) 11/07/2017   CREATININE 2.10 (H) 06/04/2017   CREATININE 2.92 (H) 03/28/2017      WBC 18.9  HG/HCT   stable,      Component Value Date/Time   HGB 11.6 (L) 11/07/2017 1639   HCT 36.4 (L) 11/07/2017 1639       Troponin (Point of Care Test) Recent Labs    11/07/17 1649  TROPIPOC 0.02     Lactic Acid, Venous    Component Value Date/Time   LATICACIDVEN 4.3 (HH) 11/07/2017 1715   Lipase 33   UA  no evidence of UTI       CXR - NON acute      ECG:  Personally reviewed by me showing: HR : 116 Rhythm: sinus tachycardia   no evidence of ischemic  changes QTC 409     ED Triage Vitals  Enc Vitals Group     BP 11/07/17 1623 (!) 158/73     Pulse Rate 11/07/17 1623 (!) 112     Resp 11/07/17 1623 (!) 24     Temp 11/07/17 1623 100 F (37.8 C)     Temp Source 11/07/17 1623 Oral     SpO2 11/07/17 1613 96 %     Weight 11/07/17 1623 165 lb (74.8 kg)     Height 11/07/17 1623 5\' 7"  (1.702 m)     Head Circumference --      Peak Flow --      Pain Score 11/07/17 1623 0     Pain Loc --      Pain Edu? --      Excl. in [place]? --   TMAX(24)@       Latest  Blood pressure 122/64, pulse 95, temperature 98.6 F (37 C), temperature source Oral, resp. rate (!) 35, height 5\' 7"  (1.702 m), weight 74.8 kg (165 lb), SpO2 93 %.      Hospitalist was called for admission for Dehydration sepsis secondary to gastroenteritis   Review of Systems:    Pertinent positives include: Fevers, chills, fatigue, abdominal pain, nausea,  diarrhea, cough Constitutional:  No weight loss, night sweats,  weight loss  HEENT:  No headaches, Difficulty swallowing,Tooth/dental problems,Sore throat,  No sneezing, itching, ear ache, nasal congestion, post nasal drip,  Cardio-vascular:  No chest pain, Orthopnea, PND, anasarca, dizziness, palpitations.no Bilateral lower extremity swelling  GI:  No heartburn, indigestion, vomiting,change in bowel habits, loss of appetite, melena, blood in stool, hematemesis Resp:  no shortness of breath at rest. No dyspnea on exertion, No excess mucus, no  No coughing up of blood.No change in color of mucus.No wheezing. Skin:  no rash or lesions. No jaundice GU:  no dysuria, change in color of urine, no urgency or frequency. No straining to urinate.  No flank pain.  Musculoskeletal:  No joint pain or no joint swelling. No decreased range of motion. No back pain.  Psych:  No change in mood or affect. No depression or anxiety. No memory loss.  Neuro: no localizing neurological complaints, no tingling, no weakness, no double vision, no  gait abnormality, no slurred speech, no confusion  As per HPI otherwise 10 point review of systems negative.   Past Medical History:   Past Medical History:  Diagnosis Date  . AAA (abdominal aortic aneurysm) ([place]):  4 cm 03/24/2017   3.5X 3. 5 CM PER 10-6 ABDOMINAL CT FOLOOWED EVRY 2 YEARS   . Abdominal aneurysm ([place])   . Diverticulitis 03/2017   1 UNIT BLOOD GIVEN  . Gout   . Hepatic cyst: Solitary 03/24/2017  . Hydrocele sac   . Hyperlipidemia 03/24/2017  . Hypertension   . Hyperuricemia 03/24/2017  . 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 Renal transplant, status post: 21 years ago: 1997 03/24/2017      Past Surgical History:  Procedure Laterality Date  . CYST REMOVED FROM SPINE  YRS AGO  . HERNIA REPAIR     SEVERAL  . HYDROCELE EXCISION Bilateral 06/04/2017   Procedure: BILATERAL HYDROCELECTOMY ADULT;  Surgeon: Lucas Mallow, MD;  Location: [facility];  Service: Urology;  Laterality: Bilateral;  . PILIODINAL CYST  40-50 YRS AGO    Social History:  Ambulatory   independently  Or cane     reports that he has quit smoking. His smoking use included cigarettes. He has a 40.00 pack-year smoking history. He has never used smokeless tobacco. He reports that he does not drink alcohol or use drugs.     Family History:   Family History  Problem Relation Age of Onset  . Heart attack Father     Allergies: Allergies  Allergen Reactions  . Norvasc [Amlodipine Besylate]     Pt state it cause him weight gain  . Tape Other (See Comments)     Prior to Admission medications   Medication Sig Start Date End Date Taking? Authorizing Provider  allopurinol (ZYLOPRIM) 100 MG tablet Take 100 mg by mouth 2 (two) times daily. 03/13/17  Yes [provider]  atorvastatin (LIPITOR) 20 MG tablet Take 20 mg by mouth daily. 02/20/17  Yes [provider]  calcium gluconate 500 MG tablet Take 500-1,000 mg by mouth 2 (two) times daily. 2 in am, 1 in pm   Yes  [provider]  furosemide (LASIX) 40 MG tablet Take 40 mg by mouth daily.    Yes [provider]  metoprolol tartrate (LOPRESSOR) 50 MG tablet Take 50 mg by mouth 2 (two) times daily. 12/25/16  Yes [provider]  NEORAL 25 MG capsule Take 50-75 mg by mouth 2 (two) times daily. Take 75 mg (3 capsules) in the morning and 50 mg (2 capsules) in the evening 02/27/17  Yes [provider]  predniSONE (DELTASONE) 5 MG tablet Take 5 mg by mouth daily. 03/16/17  Yes [provider]  senna-docusate (SENOKOT-S) 8.6-50 MG tablet Take 1 tablet by mouth at bedtime. Patient taking differently: Take 1 tablet by mouth at bedtime as needed for moderate constipation.  03/28/17  Yes Eugenie Filler, MD  telmisartan (MICARDIS) 80 MG tablet Take 80 mg by mouth daily. 09/24/17  Yes [provider]  HYDROcodone-acetaminophen (NORCO/VICODIN) 5-325 MG tablet Take 1 tablet by mouth every 4 (four) hours as needed for moderate pain. Patient not taking: Reported on 11/07/2017 06/04/17 06/04/18  Lucas Mallow, MD   Physical Exam: Blood pressure 122/64, pulse 95, temperature 98.6 F (37 C), temperature source Oral, resp. rate (!) 35, height 5\' 7"  (1.702 m), weight 74.8 kg (165 lb), SpO2 93 %. 1. General:  in No Acute distress   Chronically ill   -appearing 2. Psychological: Alert and   Oriented 3. Head/ENT:   Dry Mucous Membranes  Head Non traumatic, neck supple                         Poor Dentition 4. SKIN:   decreased Skin turgor,  Skin clean Dry and intact no rash 5. Heart: Regular rate and rhythm no Murmur, no Rub or gallop 6. Lungs: no wheezes or crackles   7. Abdomen: Soft,  non-tender, Non distended  obese  bowel sounds present 8. Lower extremities: no clubbing, cyanosis, or edema 9. Neurologically Grossly intact, moving all 4 extremities equally  10. MSK: Normal range of motion   LABS:     Recent Labs  Lab 11/07/17 1639    WBC 18.9*  NEUTROABS 18.2*  HGB 11.6*  HCT 36.4*  MCV 94.5  PLT 888   Basic Metabolic Panel: Recent Labs  Lab 11/07/17 1639  NA 141  K 4.4  CL 107  CO2 20*  GLUCOSE 161*  BUN 78*  CREATININE 2.54*  CALCIUM 9.1      Recent Labs  Lab 11/07/17 1639  AST 25  ALT 12*  ALKPHOS 61  BILITOT 0.5  PROT 6.2*  ALBUMIN 2.8*   Recent Labs  Lab 11/07/17 1639  LIPASE 33   No results for input(s): AMMONIA in the last 168 hours.    HbA1C: No results for input(s): HGBA1C in the last 72 hours. CBG: No results for input(s): GLUCAP in the last 168 hours.    Urine analysis:    Component Value Date/Time   COLORURINE YELLOW 11/07/2017 [place] 11/07/2017 1715   LABSPEC 1.010 11/07/2017 1715   PHURINE 5.0 11/07/2017 1715   GLUCOSEU NEGATIVE 11/07/2017 1715   HGBUR MODERATE (A) 11/07/2017 1715   BILIRUBINUR NEGATIVE 11/07/2017 1715   KETONESUR NEGATIVE 11/07/2017 1715   PROTEINUR 100 (A) 11/07/2017 1715   NITRITE NEGATIVE 11/07/2017 1715   LEUKOCYTESUR NEGATIVE 11/07/2017 1715       Cultures:    Component Value Date/Time   SDES URINE, RANDOM 03/25/2017 2146   SPECREQUEST NONE 03/25/2017 2146   CULT MULTIPLE SPECIES PRESENT, SUGGEST RECOLLECTION (A) 03/25/2017 2146   REPTSTATUS 03/27/2017 FINAL 03/25/2017 2146     Radiological Exams on Admission: Dg Chest Port 1 View  Result Date: 11/07/2017 CLINICAL DATA:  Generalized weakness. EXAM: PORTABLE CHEST 1 VIEW COMPARISON:  03/24/2017 FINDINGS: The heart size and mediastinal contours are within normal limits. Stable mild tortuosity of the thoracic aorta. There is no evidence of pulmonary edema, consolidation, pneumothorax, nodule or pleural fluid. The visualized skeletal structures are unremarkable. IMPRESSION: No active disease. Electronically Signed   By: Aletta Edouard M.D.   On: 11/07/2017 17:15    Chart has been reviewed    Assessment/Plan   77 y.o. male with medical history significant of  CKD stage 3,sp renal transplant, IgA nephropathy, HTN, HLD, AAA  Admitted for  Dehydration sepsis secondary to gastroenteritis  Present on Admission: . Gastroenteritis likely cause of nausea vomiting and loose BM if has persistent diarrhea will need GI gastric panel check.  For right now we will rehydrate  . Acute kidney injury superimposed on CKD [facility]): Stage 4 rehydrate and follow kidney function obtain urine electrolytes . Dehydration -  - we'll administer IV fluids and recheck orthostatics in the morning History of renal transplant will continue home medications check cyclosporine level trough . HTN (hypertension) stable continue metoprolol but hold other blood pressure medications while dehydrated resume as able . Hyperlipidemia stable continue medications . Iron deficiency anemia  chronic currently stable . [place] ([place]) sepsis physiology present on admission elevated lactic acid also present will continue for now with broad-spectrum antibiotics await results of blood cultures Rehydrate lactic acid has been coming down with IV fluids no evidence of hypotension  Other plan as per orders.  DVT prophylaxis:   Lovenox     Code Status:   DNR/DNI  as per patient   I had personally discussed CODE STATUS with patient and family  Family Communication:   Family  at  Bedside  plan of care was discussed with Daughter,   Disposition Plan:      To home once workup is complete and patient is stable                       Would benefit from PT/OT eval prior to DC ordered                                               Consults called: Spoke to [facility] nephrology recommended continue home medications checks cyclosporine trough, please arrange for outpatient follow-up with nephrology at the time of discharge  Admission status:  obs   Level of care    tele                Toy Baker 11/07/2017, 8:11 PM    [facility]  Pager [phone]   after 2 AM please page floor coverage PA If  7AM-7PM, please contact the day team taking care of the patient  [email or web address]  Password [email or web address]

## 2017-11-07 NOTE — ED Provider Notes (Signed)
[facility] DEPT Provider Note   CSN: [id number] Arrival date & time: 11/07/17  1559     History   Chief Complaint Chief Complaint  Patient presents with  . Weakness    HPI Caleb Hicks is a 77 y.o. male.  77 year old male with prior history of renal transplant, hypertension, hyperlipidemia, and AAA resents for evaluation of weakness.  Patient reports that today he started having "dry heaves," diarrhea, and weakness.  He denies focal weakness.  He reports feeling weak all over.  He reports that he was unable to stand and walk around his house today. He lives alone.  He denies fever.  He denies chest pain, shortness of breath, vomiting, abdominal pain, focal weakness, or other specific complaint.  The history is provided by medical records and the patient.  Weakness  Primary symptoms include no focal weakness. This is a new problem. The current episode started 6 to 12 hours ago. The problem has not changed since onset.There was no focality noted. There has been no fever. Pertinent negatives include no shortness of breath and no chest pain.    Past Medical History:  Diagnosis Date  . AAA (abdominal aortic aneurysm) ([place]): 4 cm 03/24/2017   3.5X 3. 5 CM PER 10-6 ABDOMINAL CT FOLOOWED EVRY 2 YEARS   . Abdominal aneurysm ([place])   . Diverticulitis 03/2017   1 UNIT BLOOD GIVEN  . Gout   . Hepatic cyst: Solitary 03/24/2017  . Hydrocele sac   . Hyperlipidemia 03/24/2017  . Hypertension   . Hyperuricemia 03/24/2017  . 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 Iron deficiency anemia 03/25/2017  . Low vitamin B12 level 03/25/2017  . GI bleed 03/24/2017  . Acute kidney injury superimposed on CKD [facility]): Stage 4 03/24/2017  . Dehydration 03/24/2017  . Acute blood loss anemia 03/24/2017  . Leukocytosis 03/24/2017  . Sinus tachycardia 03/24/2017  .  HTN (hypertension) 03/24/2017  . AAA (abdominal aortic aneurysm) ([place]): 4 cm 03/24/2017  . IgA nephropathy 03/24/2017  . Hyperlipidemia 03/24/2017  . 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 Hyperuricemia 03/24/2017  . Hepatic cyst: Solitary 03/24/2017  . Renal cyst 03/24/2017  . 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 Diverticulitis 03/24/2017  . Rectal bleeding     Past Surgical History:  Procedure Laterality Date  . CYST REMOVED FROM SPINE  YRS AGO  . HERNIA REPAIR     SEVERAL  . HYDROCELE EXCISION Bilateral 06/04/2017   Procedure: BILATERAL HYDROCELECTOMY ADULT;  Surgeon: Lucas Mallow, MD;  Location: [facility];  Service: Urology;  Laterality: Bilateral;  . PILIODINAL CYST  40-50 YRS AGO        Home Medications    Prior to Admission medications   Medication Sig Start Date End Date Taking? Authorizing Provider  allopurinol (ZYLOPRIM) 100 MG tablet Take 100 mg by mouth 2 (two) times daily. 03/13/17   [provider]  atorvastatin (LIPITOR) 20 MG tablet Take 20 mg by mouth daily. 02/20/17   [provider]  calcium gluconate 500 MG tablet Take 500-1,000 mg by mouth 2 (two) times daily. 2 in am, 1 in pm    [provider]  furosemide (LASIX) 40 MG tablet Take 40 mg by mouth.    [provider]  HYDROcodone-acetaminophen (NORCO/VICODIN) 5-325 MG tablet Take 1 tablet by mouth  every 4 (four) hours as needed for moderate pain. 06/04/17 06/04/18  Marton Redwood III, MD  metoprolol tartrate (LOPRESSOR) 50 MG tablet Take 50 mg by mouth 2 (two) times daily. 12/25/16   [provider]  NEORAL 25 MG capsule Take 50-75 mg by mouth 2 (two) times daily. Take 75 mg (3 capsules) in the morning and 50 mg (2 capsules) in the evening 02/27/17   [provider]  predniSONE (DELTASONE) 5 MG tablet Take 5 mg by mouth daily. 03/16/17   [provider]  senna-docusate (SENOKOT-S) 8.6-50 MG  tablet Take 1 tablet by mouth at bedtime. Patient taking differently: Take 1 tablet by mouth at bedtime as needed.  03/28/17   Eugenie Filler, MD    Family History Family History  Problem Relation Age of Onset  . Heart attack Father     Social History Social History   Tobacco Use  . Smoking status: Former Smoker    Packs/day: 2.00    Years: 20.00    Pack years: 40.00    Types: Cigarettes  . Smokeless tobacco: Never Used  . Tobacco comment: 1984  Substance Use Topics  . Alcohol use: No    Frequency: Never  . Drug use: No     Allergies   Norvasc [amlodipine besylate] and Tape   Review of Systems Review of Systems  Respiratory: Negative for shortness of breath.   Cardiovascular: Negative for chest pain.  Gastrointestinal: Positive for diarrhea and nausea.       "dry heaves"   Neurological: Positive for weakness. Negative for focal weakness.  All other systems reviewed and are negative.    Physical Exam Updated Vital Signs BP (!) 146/83   Pulse (!) 116   Temp 100 F (37.8 C) (Oral)   Resp (!) 31   Ht 5\' 7"  (1.702 m)   Wt 74.8 kg (165 lb)   SpO2 96%   BMI 25.84 kg/m   Physical Exam  Constitutional: He appears well-developed and well-nourished.  HENT:  Head: Normocephalic and atraumatic.  Eyes: Conjunctivae are normal.  Neck: Neck supple.  Cardiovascular: Normal rate and regular rhythm.  No murmur heard. Pulmonary/Chest: Effort normal and breath sounds normal. No respiratory distress.  Abdominal: Soft. There is no tenderness.  Musculoskeletal: Normal range of motion. He exhibits no edema.  Neurological: He is alert.  Skin: Skin is warm and dry.  Psychiatric: He has a normal mood and affect.  Nursing note and vitals reviewed.    ED Treatments / Results  Labs (all labs ordered are listed, but only abnormal results are displayed) Labs Reviewed  COMPREHENSIVE METABOLIC PANEL - Abnormal; Notable for the following components:      Result Value    CO2 20 (*)    Glucose, Bld 161 (*)    BUN 78 (*)    Creatinine, Ser 2.54 (*)    Total Protein 6.2 (*)    Albumin 2.8 (*)    ALT 12 (*)    GFR calc non Af Amer 23 (*)    GFR calc Af Amer 26 (*)    All other components within normal limits  CBC WITH DIFFERENTIAL/PLATELET - Abnormal; Notable for the following components:   WBC 18.9 (*)    RBC 3.85 (*)    Hemoglobin 11.6 (*)    HCT 36.4 (*)    Neutro Abs 18.2 (*)    Lymphs Abs 0.3 (*)    All other components within normal limits  URINALYSIS, ROUTINE W REFLEX  MICROSCOPIC - Abnormal; Notable for the following components:   Hgb urine dipstick MODERATE (*)    Protein, ur 100 (*)    Bacteria, UA RARE (*)    All other components within normal limits  LACTIC ACID, PLASMA - Abnormal; Notable for the following components:   Lactic Acid, Venous 4.3 (*)    All other components within normal limits  I-STAT CG4 LACTIC ACID, ED - Abnormal; Notable for the following components:   Lactic Acid, Venous 3.93 (*)    All other components within normal limits  CULTURE, BLOOD (ROUTINE X 2)  CULTURE, BLOOD (ROUTINE X 2)  URINE CULTURE  LIPASE, BLOOD  LACTIC ACID, PLASMA  I-STAT TROPONIN, ED    EKG EKG Interpretation  Date/Time:  Wednesday Nov 07 2017 16:28:49 EDT Ventricular Rate:  116 PR Interval:    QRS Duration: 71 QT Interval:  294 QTC Calculation: 409 R Axis:   -17 Text Interpretation:  Sinus tachycardia Borderline left axis deviation Low voltage, precordial leads Confirmed by Dene Gentry [phone]) on 11/07/2017 4:31:01 PM   Radiology No results found.  Procedures Procedures (including critical care time)  Medications Ordered in ED Medications  piperacillin-tazobactam (ZOSYN) IVPB 3.375 g (3.375 g Intravenous New Bag/Given 11/07/17 1705)  vancomycin (VANCOCIN) IVPB 1000 mg/200 mL premix (1,000 mg Intravenous New Bag/Given 11/07/17 1703)  sodium chloride 0.9 % bolus 1,000 mL (1,000 mLs Intravenous New Bag/Given 11/07/17 1703)    ondansetron (ZOFRAN) injection 4 mg (4 mg Intravenous Given 11/07/17 1705)  acetaminophen (TYLENOL) tablet 650 mg (650 mg Oral Given 11/07/17 1701)     Initial Impression / Assessment and Plan / ED Course  I have reviewed the triage vital signs and the nursing notes.  Pertinent labs & imaging results that were available during my care of the patient were reviewed by me and considered in my medical decision making (see chart for details).     MDM  Screen complete  Patient is presenting for evaluation of generalized weakness with associated nausea and diarrhea.  Patient's prior history is significant for immunosuppression with a history of renal transplant.  Patient's initial labs are concerning for elevated WBC and an elevation in his BUN and creatinine.   This is most consistent with mild to moderate dehydration.  Patient with a mildly elevated lactic acid upon arrival.  IV fluids initiated.  Broad coverage empiric antibiotics started in the ED. Given his history, I suspect a likely gastroenteritis with resulting dehydration.   Patient requires admission for further treatment.   Hospitalist service is aware of case and will evaluate for admission.     Final Clinical Impressions(s) / ED Diagnoses   Final diagnoses:  Dehydration  Generalized weakness    ED Discharge Orders    None       Valarie Merino, MD 11/07/17 [phone]

## 2017-11-07 NOTE — Progress Notes (Signed)
A consult was received from an ED physician for vancomycin and zosyn per pharmacy dosing.  The patient's profile has been reviewed for ht/wt/allergies/indication/available labs.    A one time order has been placed for vancomycin 1000 mg and zosyn 3.375 gm IV over 30 min x1.  Further antibiotics/pharmacy consults should be ordered by admitting physician if indicated.                       Thank you, Lynelle Doctor 11/07/2017  4:35 PM

## 2017-11-08 ENCOUNTER — Observation Stay (HOSPITAL_COMMUNITY): Payer: Medicare Other

## 2017-11-08 DIAGNOSIS — R531 Weakness: Secondary | ICD-10-CM | POA: Diagnosis not present

## 2017-11-08 DIAGNOSIS — A4152 Sepsis due to Pseudomonas: Secondary | ICD-10-CM | POA: Diagnosis not present

## 2017-11-08 LAB — CBC
HCT: 30.7 % — ABNORMAL LOW (ref 39.0–52.0)
HEMOGLOBIN: 9.7 g/dL — AB (ref 13.0–17.0)
MCH: 30.1 pg (ref 26.0–34.0)
MCHC: 31.6 g/dL (ref 30.0–36.0)
MCV: 95.3 fL (ref 78.0–100.0)
Platelets: 197 10*3/uL (ref 150–400)
RBC: 3.22 MIL/uL — AB (ref 4.22–5.81)
RDW: 15.1 % (ref 11.5–15.5)
WBC: 13.8 10*3/uL — AB (ref 4.0–10.5)

## 2017-11-08 LAB — COMPREHENSIVE METABOLIC PANEL
ALT: 13 U/L — ABNORMAL LOW (ref 17–63)
ANION GAP: 11 (ref 5–15)
AST: 25 U/L (ref 15–41)
Albumin: 2.3 g/dL — ABNORMAL LOW (ref 3.5–5.0)
Alkaline Phosphatase: 46 U/L (ref 38–126)
BUN: 76 mg/dL — ABNORMAL HIGH (ref 6–20)
CHLORIDE: 107 mmol/L (ref 101–111)
CO2: 19 mmol/L — AB (ref 22–32)
Calcium: 7.8 mg/dL — ABNORMAL LOW (ref 8.9–10.3)
Creatinine, Ser: 2.86 mg/dL — ABNORMAL HIGH (ref 0.61–1.24)
GFR calc non Af Amer: 20 mL/min — ABNORMAL LOW (ref >60)
GFR, EST AFRICAN AMERICAN: 23 mL/min — AB (ref >60)
Glucose, Bld: 149 mg/dL — ABNORMAL HIGH (ref 65–99)
Potassium: 5.5 mmol/L — ABNORMAL HIGH (ref 3.5–5.1)
SODIUM: 137 mmol/L (ref 135–145)
Total Bilirubin: 0.8 mg/dL (ref 0.3–1.2)
Total Protein: 5 g/dL — ABNORMAL LOW (ref 6.5–8.1)

## 2017-11-08 LAB — BLOOD CULTURE ID PANEL (REFLEXED)
ACINETOBACTER BAUMANNII: NOT DETECTED
Candida albicans: NOT DETECTED
Candida glabrata: NOT DETECTED
Candida krusei: NOT DETECTED
Candida parapsilosis: NOT DETECTED
Candida tropicalis: NOT DETECTED
Carbapenem resistance: NOT DETECTED
ENTEROBACTER CLOACAE COMPLEX: NOT DETECTED
ENTEROCOCCUS SPECIES: NOT DETECTED
Enterobacteriaceae species: NOT DETECTED
Escherichia coli: NOT DETECTED
HAEMOPHILUS INFLUENZAE: NOT DETECTED
Klebsiella oxytoca: NOT DETECTED
Klebsiella pneumoniae: NOT DETECTED
LISTERIA MONOCYTOGENES: NOT DETECTED
NEISSERIA MENINGITIDIS: NOT DETECTED
PROTEUS SPECIES: NOT DETECTED
Pseudomonas aeruginosa: DETECTED — AB
STAPHYLOCOCCUS SPECIES: NOT DETECTED
STREPTOCOCCUS AGALACTIAE: NOT DETECTED
STREPTOCOCCUS PNEUMONIAE: NOT DETECTED
STREPTOCOCCUS SPECIES: NOT DETECTED
Serratia marcescens: NOT DETECTED
Staphylococcus aureus (BCID): NOT DETECTED
Streptococcus pyogenes: NOT DETECTED

## 2017-11-08 LAB — GASTROINTESTINAL PANEL BY PCR, STOOL (REPLACES STOOL CULTURE)
ASTROVIRUS: NOT DETECTED
Adenovirus F40/41: NOT DETECTED
Campylobacter species: NOT DETECTED
Cryptosporidium: NOT DETECTED
Cyclospora cayetanensis: NOT DETECTED
ENTAMOEBA HISTOLYTICA: NOT DETECTED
ENTEROAGGREGATIVE E COLI (EAEC): NOT DETECTED
ENTEROTOXIGENIC E COLI (ETEC): NOT DETECTED
Enteropathogenic E coli (EPEC): NOT DETECTED
GIARDIA LAMBLIA: NOT DETECTED
NOROVIRUS GI/GII: NOT DETECTED
Plesimonas shigelloides: NOT DETECTED
ROTAVIRUS A: NOT DETECTED
SALMONELLA SPECIES: NOT DETECTED
SAPOVIRUS (I, II, IV, AND V): NOT DETECTED
SHIGA LIKE TOXIN PRODUCING E COLI (STEC): NOT DETECTED
SHIGELLA/ENTEROINVASIVE E COLI (EIEC): NOT DETECTED
VIBRIO CHOLERAE: NOT DETECTED
Vibrio species: NOT DETECTED
Yersinia enterocolitica: NOT DETECTED

## 2017-11-08 LAB — PHOSPHORUS: PHOSPHORUS: 4.2 mg/dL (ref 2.5–4.6)

## 2017-11-08 LAB — POTASSIUM
POTASSIUM: 4.3 mmol/L (ref 3.5–5.1)
Potassium: 5.3 mmol/L — ABNORMAL HIGH (ref 3.5–5.1)

## 2017-11-08 LAB — LACTIC ACID, PLASMA
LACTIC ACID, VENOUS: 1.8 mmol/L (ref 0.5–1.9)
Lactic Acid, Venous: 2.3 mmol/L (ref 0.5–1.9)
Lactic Acid, Venous: 1.2 mmol/L (ref 0.5–1.9)

## 2017-11-08 LAB — MAGNESIUM
MAGNESIUM: 1.2 mg/dL — AB (ref 1.7–2.4)
MAGNESIUM: 2.1 mg/dL (ref 1.7–2.4)

## 2017-11-08 LAB — TSH: TSH: 1.44 u[IU]/mL (ref 0.350–4.500)

## 2017-11-08 MED ORDER — HEPARIN SODIUM (PORCINE) 5000 UNIT/ML IJ SOLN
5000.0000 [IU] | Freq: Three times a day (TID) | INTRAMUSCULAR | Status: DC
  Administered 2017-11-08 – 2017-11-24 (×47): 5000 [IU] via SUBCUTANEOUS
  Filled 2017-11-08 (×47): qty 1

## 2017-11-08 MED ORDER — HEPARIN SODIUM (PORCINE) 5000 UNIT/ML IJ SOLN: 5000.0000 [IU] | Freq: Three times a day (TID) | INTRAMUSCULAR | Status: DC

## 2017-11-08 MED ORDER — MAGNESIUM SULFATE 2 GM/50ML IV SOLN
2.0000 g | Freq: Once | INTRAVENOUS | Status: AC
  Administered 2017-11-08: 2 g via INTRAVENOUS
  Filled 2017-11-08: qty 50

## 2017-11-08 MED ORDER — SODIUM CHLORIDE 0.9 % IV SOLN
INTRAVENOUS | Status: DC
  Administered 2017-11-08 – 2017-11-10 (×4): via INTRAVENOUS

## 2017-11-08 MED ORDER — PIPERACILLIN-TAZOBACTAM 3.375 G IVPB
3.3750 g | Freq: Three times a day (TID) | INTRAVENOUS | Status: DC
  Administered 2017-11-08 – 2017-11-09 (×2): 3.375 g via INTRAVENOUS
  Filled 2017-11-08 (×2): qty 50

## 2017-11-08 MED ORDER — PREDNISONE 5 MG PO TABS
5.0000 mg | ORAL_TABLET | Freq: Two times a day (BID) | ORAL | Status: DC
  Administered 2017-11-08 – 2017-11-11 (×7): 5 mg via ORAL
  Filled 2017-11-08 (×7): qty 1

## 2017-11-08 MED ORDER — SODIUM POLYSTYRENE SULFONATE 15 GM/60ML PO SUSP
30.0000 g | Freq: Once | ORAL | Status: AC
  Administered 2017-11-08: 30 g via ORAL
  Filled 2017-11-08: qty 120

## 2017-11-08 NOTE — Progress Notes (Signed)
PHARMACY - PHYSICIAN COMMUNICATION CRITICAL VALUE ALERT - BLOOD CULTURE IDENTIFICATION (BCID)  Caleb Hicks is an 77 y.o. male who presented to [facility] on 11/07/2017 with a chief complaint of  generalized weakness and abdominal discomfort with nausea and diarrhea,   Assessment:  IAI, immunocompromised pt  Name of physician (or Provider) Contacted: Marlowe Sax  Current antibiotics: none  Changes to prescribed antibiotics recommended:  Resume zosyn to cover PsA bacteremia and IAI. CrCl 20.2 - on the borderline for lower zosyn dose - will f/u SCr and adjust as needed  Results for orders placed or performed during the hospital encounter of 11/07/17  Blood Culture ID Panel (Reflexed) (Collected: 11/07/2017  4:40 PM)  Result Value Ref Range   Enterococcus species NOT DETECTED NOT DETECTED   Listeria monocytogenes NOT DETECTED NOT DETECTED   Staphylococcus species NOT DETECTED NOT DETECTED   Staphylococcus aureus NOT DETECTED NOT DETECTED   Streptococcus species NOT DETECTED NOT DETECTED   Streptococcus agalactiae NOT DETECTED NOT DETECTED   Streptococcus pneumoniae NOT DETECTED NOT DETECTED   Streptococcus pyogenes NOT DETECTED NOT DETECTED   Acinetobacter baumannii NOT DETECTED NOT DETECTED   Enterobacteriaceae species NOT DETECTED NOT DETECTED   Enterobacter cloacae complex NOT DETECTED NOT DETECTED   Escherichia coli NOT DETECTED NOT DETECTED   Klebsiella oxytoca NOT DETECTED NOT DETECTED   Klebsiella pneumoniae NOT DETECTED NOT DETECTED   Proteus species NOT DETECTED NOT DETECTED   Serratia marcescens NOT DETECTED NOT DETECTED   Carbapenem resistance NOT DETECTED NOT DETECTED   Haemophilus influenzae NOT DETECTED NOT DETECTED   Neisseria meningitidis NOT DETECTED NOT DETECTED   Pseudomonas aeruginosa DETECTED (A) NOT DETECTED   Candida albicans NOT DETECTED NOT DETECTED   Candida glabrata NOT DETECTED NOT DETECTED   Candida krusei NOT DETECTED NOT DETECTED   Candida  parapsilosis NOT DETECTED NOT DETECTED   Candida tropicalis NOT DETECTED NOT DETECTED   Eudelia Bunch, Pharm.D. [phone] 11/08/2017 6:30 PM

## 2017-11-08 NOTE — Progress Notes (Signed)
[facility] Progress Note  Patient: Caleb Hicks JJO:[id number]   PCP: Patient, No Pcp Per DOB: 09-03-1940   DOA: 11/07/2017   DOS: 11/08/2017   Date of Service: the patient was seen and examined on 11/08/2017  Subjective: Feeling better, no nausea no vomiting no fever no chills.  No further vomiting has occasional sense of nausea.  No abdominal pain no chest pain no cough.  Had one loose BM today.  No blood in the stool.  Brief hospital course: Pt. with PMH of CKD stage 3,sp renal transplant, IgA nephropathy, HTN, HLD, AAA; admitted on 11/07/2017, presented with complaint of generalized weakness and abdominal discomfort with nausea and diarrhea, was found to have viral gastroenteritis with secondary adrenal insufficiency. Currently further plan is continue IV hydration and increased steroid.  Assessment and Plan: . Gastroenteritis likely cause of nausea vomiting and loose BM if has persistent diarrhea will need GI gastric panel check.  For right now we will rehydrate  . Acute kidney injury superimposed on CKD [facility]): Stage 4 r Hyperkalemia Presented with dehydration, continue IV hydration and follow kidney function obtain urine electrolytes Reduce rate of the fluid from 1 25-75.  History of renal transplant will continue home medications check cyclosporine level trough Increase steroid from 5 mg to twice daily for stress dose.  Marland Kitchen HTN (hypertension) stable continue metoprolol but hold other blood pressure medications while dehydrated resume as able . Hyperlipidemia stable continue medications . Iron deficiency anemia chronic currently stable . Sirs (Zinc) sepsis physiology present on admission elevated lactic acid also present Patient was started on broad-spectrum antibiotics although my suspicion for him having an active bacterial infection is less and therefore we will discontinue the antibiotics and monitor.  Rehydrate lactic acid has been coming down with IV fluids no evidence of  hypotension  Diet: Renal diet DVT Prophylaxis: subcutaneous Heparin  Advance goals of care discussion: DNR DNI  Family Communication: family was present at bedside, at the time of interview. The pt provided permission to discuss medical plan with the family. Opportunity was given to ask question and all questions were answered satisfactorily.   Disposition:  Discharge to home.  Consultants: none Procedures: none  Antibiotics: Anti-infectives (From admission, onward)   Start     Dose/Rate Route Frequency Ordered Stop   11/09/17 1700  vancomycin (VANCOCIN) IVPB 1000 mg/200 mL premix  Status:  Discontinued     1,000 mg 200 mL/hr over 60 Minutes Intravenous Every 48 hours 11/07/17 1943 11/08/17 1040   11/07/17 2300  piperacillin-tazobactam (ZOSYN) IVPB 3.375 g  Status:  Discontinued     3.375 g 12.5 mL/hr over 240 Minutes Intravenous Every 8 hours 11/07/17 1943 11/08/17 1040   11/07/17 1645  vancomycin (VANCOCIN) IVPB 1000 mg/200 mL premix  Status:  Discontinued     1,000 mg 200 mL/hr over 60 Minutes Intravenous Every 12 hours 11/07/17 1637 11/07/17 1639   11/07/17 1645  piperacillin-tazobactam (ZOSYN) IVPB 3.375 g     3.375 g 100 mL/hr over 30 Minutes Intravenous  Once 11/07/17 1637 11/07/17 1735   11/07/17 1645  vancomycin (VANCOCIN) IVPB 1000 mg/200 mL premix     1,000 mg 200 mL/hr over 60 Minutes Intravenous  Once 11/07/17 1639 11/07/17 1803       Objective: Physical Exam: Vitals:   11/08/17 1004 11/08/17 1007 11/08/17 1009 11/08/17 1343  BP: 135/81 128/68 (!) 151/60   Pulse: 74 89 70 84  Resp:    (!) 23  Temp:    98.3 F (  36.8 C)  TempSrc:    Oral  SpO2: 100% 100% 100% 100%  Weight:      Height:        Intake/Output Summary (Last 24 hours) at 11/08/2017 1424 Last data filed at 11/08/2017 1112 Gross per 24 hour  Intake 2153.33 ml  Output 175 ml  Net 1978.33 ml   Filed Weights   11/07/17 1623  Weight: 74.8 kg (165 lb)   General: Alert, Awake and Oriented to  Time, Place and Person. Appear in moderate distress, affect flat Eyes: PERRL, Conjunctiva normal ENT: Oral Mucosa clear moist. Neck: no JVD, no Abnormal Mass Or lumps Cardiovascular: S1 and S2 Present, no Murmur, Peripheral Pulses Present Respiratory: normal respiratory effort, Bilateral Air entry equal and Decreased, no use of accessory muscle, Clear to Auscultation, no Crackles, no wheezes Abdomen: Bowel Sound present, Soft and no tenderness, no hernia Skin: no redness, no Rash, no induration Extremities: no Pedal edema, no calf tenderness Neurologic: Grossly no focal neuro deficit. Bilaterally Equal motor strength  Data Reviewed: CBC: Recent Labs  Lab 11/07/17 1639 11/08/17 0430  WBC 18.9* 13.8*  NEUTROABS 18.2*  --   HGB 11.6* 9.7*  HCT 36.4* 30.7*  MCV 94.5 95.3  PLT 242 983   Basic Metabolic Panel: Recent Labs  Lab 11/07/17 1639 11/08/17 0430 11/08/17 0755  NA 141 137  --   K 4.4 5.5* 5.3*  CL 107 107  --   CO2 20* 19*  --   GLUCOSE 161* 149*  --   BUN 78* 76*  --   CREATININE 2.54* 2.86*  --   CALCIUM 9.1 7.8*  --   MG  --  1.2*  --   PHOS  --  4.2  --     Liver Function Tests: Recent Labs  Lab 11/07/17 1639 11/08/17 0430  AST 25 25  ALT 12* 13*  ALKPHOS 61 46  BILITOT 0.5 0.8  PROT 6.2* 5.0*  ALBUMIN 2.8* 2.3*   Recent Labs  Lab 11/07/17 1639  LIPASE 33   No results for input(s): AMMONIA in the last 168 hours. Coagulation Profile: Recent Labs  Lab 11/07/17 2210  INR 1.08   Cardiac Enzymes: No results for input(s): CKTOTAL, CKMB, CKMBINDEX, TROPONINI in the last 168 hours. BNP (last 3 results) No results for input(s): PROBNP in the last 8760 hours. CBG: No results for input(s): GLUCAP in the last 168 hours. Studies: Dg Chest Port 1 View  Result Date: 11/07/2017 CLINICAL DATA:  Generalized weakness. EXAM: PORTABLE CHEST 1 VIEW COMPARISON:  03/24/2017 FINDINGS: The heart size and mediastinal contours are within normal limits. Stable mild  tortuosity of the thoracic aorta. There is no evidence of pulmonary edema, consolidation, pneumothorax, nodule or pleural fluid. The visualized skeletal structures are unremarkable. IMPRESSION: No active disease. Electronically Signed   By: Aletta Edouard M.D.   On: 11/07/2017 17:15    Scheduled Meds: . allopurinol  100 mg Oral BID  . atorvastatin  20 mg Oral Daily  . cycloSPORINE modified  50 mg Oral QHS  . cycloSPORINE modified  75 mg Oral Daily  . metoprolol tartrate  50 mg Oral BID  . predniSONE  5 mg Oral BID WC   Continuous Infusions: . sodium chloride 75 mL/hr at 11/08/17 1116   PRN Meds: acetaminophen **OR** acetaminophen, ondansetron **OR** ondansetron (ZOFRAN) IV  Time spent: 35 minutes  Author: Berle Mull, MD [facility] Pager: [phone] 11/08/2017 2:24 PM  If 7PM-7AM, please contact night-coverage at [email or web address], password [facility]

## 2017-11-08 NOTE — Evaluation (Signed)
Occupational Therapy Evaluation Patient Details Name: Caleb Hicks MRN: [id number] DOB: 30-Jul-1940 Today's Date: 11/08/2017    History of Present Illness Pt was admitted for dehydration/gastroenteritis.  He has a history of CKD, kidney transplant and HTN   Clinical Impression   This 77 year old man was admitted for the above.  He lives alone at baseline and performs adls.  He goes to daughter's 3-4 x week for meals, and she reports he is sedentary. Will benefit from continued OT in acute setting as well as post acute. He currently needs min A. Goals are for supervision to min guard level    Follow Up Recommendations  SNF;Supervision/Assistance - 24 hour;Home health OT(vs)    Equipment Recommendations  None recommended by OT    Recommendations for Other Services       Precautions / Restrictions Precautions Precautions: Fall Restrictions Weight Bearing Restrictions: No      Mobility Bed Mobility Overal bed mobility: Needs Assistance Bed Mobility: Rolling;Sidelying to Sit Rolling: Min assist Sidelying to sit: Min assist       General bed mobility comments: used bedrails  Transfers Overall transfer level: Needs assistance Equipment used: Rolling walker (2 wheeled) Transfers: Sit to/from [facility] Sit to Stand: Min assist Stand pivot transfers: Min assist       General transfer comment: assist to rise and steady    Balance                                           ADL either performed or assessed with clinical judgement   ADL Overall ADL's : Needs assistance/impaired Eating/Feeding: Independent   Grooming: Set up;Sitting   Upper Body Bathing: Set up;Sitting   Lower Body Bathing: Minimal assistance;Sit to/from stand   Upper Body Dressing : Set up;Sitting   Lower Body Dressing: Minimal assistance;Sit to/from stand   Toilet Transfer: Minimal assistance;Stand-pivot;RW(bed to chair)   Toileting- Clothing Manipulation  and Hygiene: Minimal assistance;Sit to/from stand         General ADL Comments: pt needs min A for sit to stand during adls.  Has bil tremor but this doesn't interfere with adls.       Vision         Perception     Praxis      Pertinent Vitals/Pain Pain Assessment: No/denies pain     Hand Dominance     Extremity/Trunk Assessment Upper Extremity Assessment Upper Extremity Assessment: Generalized weakness(bil UE tremor (not new))           Communication Communication Communication: No difficulties   Cognition Arousal/Alertness: Awake/alert Behavior During Therapy: WFL for tasks assessed/performed Overall Cognitive Status: Within Functional Limits for tasks assessed                                     General Comments  orthostatics performed: pt was not orthostatic    Exercises     Shoulder Instructions      Home Living   Living Arrangements: Alone                               Additional Comments: pt lives alone.  At time of discharge, he will either go to rehab or to his daughter's house.  He uses a  cane sometimes.  Both homes have a walk in shower.  Daughter reports pt is mostly sedentary.        Prior Functioning/Environment Level of Independence: Independent                 OT Problem List: Decreased strength;Decreased activity tolerance;Impaired balance (sitting and/or standing);Decreased knowledge of use of DME or AE      OT Treatment/Interventions: Self-care/ADL training;Energy conservation;DME and/or AE instruction;Patient/family education;Balance training;Therapeutic activities    OT Goals(Current goals can be found in the care plan section) Acute Rehab OT Goals Patient Stated Goal: none stated; agreeable to OOB OT Goal Formulation: With patient/family Time For Goal Achievement: 11/22/17 Potential to Achieve Goals: Good ADL Goals Pt Will Transfer to Toilet: with min guard assist;ambulating;regular height  toilet Pt Will Perform Toileting - Clothing Manipulation and hygiene: with set-up;with supervision;sit to/from stand Additional ADL Goal #1: pt will perform adl with set up/supervision, sit to stand  OT Frequency: Min 2X/week   Barriers to D/C:            Co-evaluation              AM-PAC PT "6 Clicks" Daily Activity     Outcome Measure Help from another person eating meals?: None Help from another person taking care of personal grooming?: A Little Help from another person toileting, which includes using toliet, bedpan, or urinal?: A Little Help from another person bathing (including washing, rinsing, drying)?: A Little Help from another person to put on and taking off regular upper body clothing?: A Little Help from another person to put on and taking off regular lower body clothing?: A Little 6 Click Score: 19   End of Session    Activity Tolerance: Patient tolerated treatment well Patient left: in chair;with call bell/phone within reach;with family/visitor present  OT Visit Diagnosis: Muscle weakness (generalized) (M62.81)                Time: 6759-1638 OT Time Calculation (min): 28 min Charges:  OT General Charges $OT Visit: 1 Visit OT Evaluation $OT Eval Low Complexity: 1 Low OT Treatments $Self Care/Home Management : 8-22 mins G-Codes:     [place], OTR/L [phone] 11/08/2017  Dachelle Molzahn 11/08/2017, 10:51 AM

## 2017-11-08 NOTE — Progress Notes (Signed)
Patient arrived to room 1407 via stretcher, patient too weak to ambulate and was transferred to bed by staff. Pt noted to be incontinent of urine, cleansed and gown changed. Oriented to room and surrounding. Patient's daughter stayed while he was getting checked in and verified he would be receiving his night time medications.

## 2017-11-08 NOTE — Progress Notes (Signed)
CRITICAL VALUE ALERT  Critical Value:  Lactic Acid 2.3  Date & Time Notied:  11/08/17 0226  Provider Notified: K.Kirby via Amion text page  Orders Received/Actions taken: Patient has orders to redraw lactic acid level at 0430

## 2017-11-08 NOTE — Evaluation (Signed)
Physical Therapy Evaluation Patient Details Name: Caleb Hicks MRN: [id number] DOB: Oct 18, 1940 Today's Date: 11/08/2017   History of Present Illness  Pt was admitted for dehydration/gastroenteritis.  He has a history of CKD, kidney transplant and HTN  Clinical Impression  The patient  Activity limited due to need for urgent BM after standing. Pt admitted with above diagnosis. Pt currently with functional limitations due to the deficits listed below (see PT Problem List).  Pt will benefit from skilled PT to increase their independence and safety with mobility to allow discharge to the venue listed below.       Follow Up Recommendations Home health PT- plans to stay with daughter.    Equipment Recommendations  None recommended by PT    Recommendations for Other Services       Precautions / Restrictions Precautions Precautions: Fall Precaution Comments: diarrhea      Mobility  Bed Mobility Overal bed mobility: Needs Assistance Bed Mobility: Sit to Supine       Sit to supine: Min guard      Transfers Overall transfer level: Needs assistance Equipment used: Rolling walker (2 wheeled) Transfers: Sit to/from Stand Sit to Stand: Min assist         General transfer comment: assist to rise and steady  Ambulation/Gait Ambulation/Gait assistance: Min assist Ambulation Distance (Feet): 4 Feet Assistive device: Rolling walker (2 wheeled) Gait Pattern/deviations: Step-to pattern     General Gait Details: had urgency for BM, /BSC brought up. Afterwards, patient backed up to bed.  Stairs            Wheelchair Mobility    Modified Rankin (Stroke Patients Only)       Balance                                             Pertinent Vitals/Pain      Home Living                        Prior Function                 Hand Dominance        Extremity/Trunk Assessment   Upper Extremity Assessment Upper Extremity  Assessment: Generalized weakness    Lower Extremity Assessment Lower Extremity Assessment: Generalized weakness    Cervical / Trunk Assessment Cervical / Trunk Assessment: Normal  Communication      Cognition Arousal/Alertness: Awake/alert Behavior During Therapy: WFL for tasks assessed/performed Overall Cognitive Status: Within Functional Limits for tasks assessed                                        General Comments      Exercises     Assessment/Plan    PT Assessment Patient needs continued PT services  PT Problem List Decreased strength;Decreased activity tolerance;Decreased mobility       PT Treatment Interventions DME instruction;Gait training;Functional mobility training;Therapeutic activities;Therapeutic exercise;Patient/family education    PT Goals (Current goals can be found in the Care Plan section)  Acute Rehab PT Goals Patient Stated Goal: none stated; agreeable to OOB PT Goal Formulation: With patient Time For Goal Achievement: 11/22/17 Potential to Achieve Goals: Good    Frequency Min 3X/week   Barriers to discharge  Co-evaluation               AM-PAC PT "6 Clicks" Daily Activity  Outcome Measure Difficulty turning over in bed (including adjusting bedclothes, sheets and blankets)?: A Little Difficulty moving from lying on back to sitting on the side of the bed? : A Little Difficulty sitting down on and standing up from a chair with arms (e.g., wheelchair, bedside commode, etc,.)?: A Lot Help needed moving to and from a bed to chair (including a wheelchair)?: A Lot Help needed walking in hospital room?: A Lot Help needed climbing 3-5 steps with a railing? : Total 6 Click Score: 13    End of Session   Activity Tolerance: Treatment limited secondary to medical complications (Comment) Patient left: in bed;with call bell/phone within reach;with bed alarm set Nurse Communication: Mobility status PT Visit Diagnosis:  Unsteadiness on feet (R26.81)    Time: 3295-1884 PT Time Calculation (min) (ACUTE ONLY): 26 min   Charges:   PT Evaluation $PT Eval Low Complexity: 1 Low PT Treatments $Therapeutic Activity: 8-22 mins   PT G CodesTresa Endo PT [place] 11/08/2017, 3:00 PM

## 2017-11-08 NOTE — Progress Notes (Signed)
Writer assumed care of this patient at 1600.  I agree with the previous nurses assessment.

## 2017-11-08 NOTE — Progress Notes (Signed)
   11/08/17 1343  Vitals  Temp 98.3 F (36.8 C)  Temp Source Oral  Pulse Rate 84  Resp (!) 23  Oxygen Therapy  SpO2 100 %  O2 Device Room Air   Patient called and needed to use the restroom.  RN assisted patient to the BR with a walker (after OT had worked with patient and was able to see that he could safely transfer to the BR) After using the restroom, patient was SOB while ambulating back to the bed and even for several minutes after getting in the bed.  Patient was even using his abdominal muscles extensively for several minutes.  Patient was upper extremities were shaking as well.  Patient did state this happens to him at home.  Patient's VS were stable with the exception of slight tachypnea.  Paged MD with change in status.

## 2017-11-09 ENCOUNTER — Encounter (HOSPITAL_COMMUNITY): Payer: Self-pay

## 2017-11-09 ENCOUNTER — Inpatient Hospital Stay (HOSPITAL_COMMUNITY): Payer: Medicare Other

## 2017-11-09 DIAGNOSIS — K5732 Diverticulitis of large intestine without perforation or abscess without bleeding: Secondary | ICD-10-CM | POA: Diagnosis present

## 2017-11-09 DIAGNOSIS — Z888 Allergy status to other drugs, medicaments and biological substances status: Secondary | ICD-10-CM | POA: Diagnosis not present

## 2017-11-09 DIAGNOSIS — R0600 Dyspnea, unspecified: Secondary | ICD-10-CM | POA: Diagnosis not present

## 2017-11-09 DIAGNOSIS — Z66 Do not resuscitate: Secondary | ICD-10-CM | POA: Diagnosis present

## 2017-11-09 DIAGNOSIS — N189 Chronic kidney disease, unspecified: Secondary | ICD-10-CM | POA: Diagnosis not present

## 2017-11-09 DIAGNOSIS — A419 Sepsis, unspecified organism: Secondary | ICD-10-CM | POA: Diagnosis not present

## 2017-11-09 DIAGNOSIS — E860 Dehydration: Secondary | ICD-10-CM | POA: Diagnosis not present

## 2017-11-09 DIAGNOSIS — N184 Chronic kidney disease, stage 4 (severe): Secondary | ICD-10-CM | POA: Diagnosis present

## 2017-11-09 DIAGNOSIS — Z7952 Long term (current) use of systemic steroids: Secondary | ICD-10-CM | POA: Diagnosis not present

## 2017-11-09 DIAGNOSIS — R001 Bradycardia, unspecified: Secondary | ICD-10-CM | POA: Diagnosis not present

## 2017-11-09 DIAGNOSIS — K529 Noninfective gastroenteritis and colitis, unspecified: Secondary | ICD-10-CM | POA: Diagnosis not present

## 2017-11-09 DIAGNOSIS — D509 Iron deficiency anemia, unspecified: Secondary | ICD-10-CM | POA: Diagnosis present

## 2017-11-09 DIAGNOSIS — Z79899 Other long term (current) drug therapy: Secondary | ICD-10-CM | POA: Diagnosis not present

## 2017-11-09 DIAGNOSIS — Z91048 Other nonmedicinal substance allergy status: Secondary | ICD-10-CM | POA: Diagnosis not present

## 2017-11-09 DIAGNOSIS — I714 Abdominal aortic aneurysm, without rupture: Secondary | ICD-10-CM | POA: Diagnosis present

## 2017-11-09 DIAGNOSIS — Z940 Kidney transplant status: Secondary | ICD-10-CM | POA: Diagnosis not present

## 2017-11-09 DIAGNOSIS — Z87891 Personal history of nicotine dependence: Secondary | ICD-10-CM | POA: Diagnosis not present

## 2017-11-09 DIAGNOSIS — A084 Viral intestinal infection, unspecified: Secondary | ICD-10-CM | POA: Diagnosis present

## 2017-11-09 DIAGNOSIS — A4152 Sepsis due to Pseudomonas: Principal | ICD-10-CM

## 2017-11-09 DIAGNOSIS — N179 Acute kidney failure, unspecified: Secondary | ICD-10-CM | POA: Diagnosis not present

## 2017-11-09 DIAGNOSIS — I130 Hypertensive heart and chronic kidney disease with heart failure and stage 1 through stage 4 chronic kidney disease, or unspecified chronic kidney disease: Secondary | ICD-10-CM | POA: Diagnosis present

## 2017-11-09 DIAGNOSIS — E875 Hyperkalemia: Secondary | ICD-10-CM | POA: Diagnosis present

## 2017-11-09 DIAGNOSIS — I5032 Chronic diastolic (congestive) heart failure: Secondary | ICD-10-CM | POA: Diagnosis present

## 2017-11-09 DIAGNOSIS — T447X5A Adverse effect of beta-adrenoreceptor antagonists, initial encounter: Secondary | ICD-10-CM | POA: Diagnosis not present

## 2017-11-09 DIAGNOSIS — I10 Essential (primary) hypertension: Secondary | ICD-10-CM | POA: Diagnosis not present

## 2017-11-09 DIAGNOSIS — M109 Gout, unspecified: Secondary | ICD-10-CM | POA: Diagnosis present

## 2017-11-09 DIAGNOSIS — E785 Hyperlipidemia, unspecified: Secondary | ICD-10-CM | POA: Diagnosis present

## 2017-11-09 DIAGNOSIS — E2749 Other adrenocortical insufficiency: Secondary | ICD-10-CM | POA: Diagnosis present

## 2017-11-09 DIAGNOSIS — D631 Anemia in chronic kidney disease: Secondary | ICD-10-CM | POA: Diagnosis present

## 2017-11-09 DIAGNOSIS — N170 Acute kidney failure with tubular necrosis: Secondary | ICD-10-CM | POA: Diagnosis present

## 2017-11-09 LAB — CBC WITH DIFFERENTIAL/PLATELET
BASOS ABS: 0 10*3/uL (ref 0.0–0.1)
Basophils Relative: 0 %
Eosinophils Absolute: 0.1 10*3/uL (ref 0.0–0.7)
Eosinophils Relative: 1 %
HCT: 30.7 % — ABNORMAL LOW (ref 39.0–52.0)
HEMOGLOBIN: 9.7 g/dL — AB (ref 13.0–17.0)
LYMPHS ABS: 0.5 10*3/uL — AB (ref 0.7–4.0)
LYMPHS PCT: 4 %
MCH: 29.9 pg (ref 26.0–34.0)
MCHC: 31.6 g/dL (ref 30.0–36.0)
MCV: 94.8 fL (ref 78.0–100.0)
Monocytes Absolute: 0.3 10*3/uL (ref 0.1–1.0)
Monocytes Relative: 3 %
NEUTROS ABS: 9.7 10*3/uL — AB (ref 1.7–7.7)
NEUTROS PCT: 92 %
PLATELETS: 177 10*3/uL (ref 150–400)
RBC: 3.24 MIL/uL — AB (ref 4.22–5.81)
RDW: 15.1 % (ref 11.5–15.5)
WBC: 10.6 10*3/uL — AB (ref 4.0–10.5)

## 2017-11-09 LAB — OSMOLALITY, URINE: OSMOLALITY UR: 324 mosm/kg (ref 300–900)

## 2017-11-09 LAB — MAGNESIUM: MAGNESIUM: 1.9 mg/dL (ref 1.7–2.4)

## 2017-11-09 LAB — COMPREHENSIVE METABOLIC PANEL
ALK PHOS: 46 U/L (ref 38–126)
ALT: 14 U/L — AB (ref 17–63)
AST: 22 U/L (ref 15–41)
Albumin: 2.2 g/dL — ABNORMAL LOW (ref 3.5–5.0)
Anion gap: 11 (ref 5–15)
BUN: 76 mg/dL — ABNORMAL HIGH (ref 6–20)
CALCIUM: 7.9 mg/dL — AB (ref 8.9–10.3)
CHLORIDE: 111 mmol/L (ref 101–111)
CO2: 18 mmol/L — ABNORMAL LOW (ref 22–32)
CREATININE: 3.57 mg/dL — AB (ref 0.61–1.24)
GFR calc Af Amer: 18 mL/min — ABNORMAL LOW (ref >60)
GFR, EST NON AFRICAN AMERICAN: 15 mL/min — AB (ref >60)
Glucose, Bld: 137 mg/dL — ABNORMAL HIGH (ref 65–99)
Potassium: 4.4 mmol/L (ref 3.5–5.1)
Sodium: 140 mmol/L (ref 135–145)
Total Bilirubin: 0.5 mg/dL (ref 0.3–1.2)
Total Protein: 5.3 g/dL — ABNORMAL LOW (ref 6.5–8.1)

## 2017-11-09 LAB — URINE CULTURE: Culture: NO GROWTH

## 2017-11-09 LAB — CREATININE, SERUM
Creatinine, Ser: 3.49 mg/dL — ABNORMAL HIGH (ref 0.61–1.24)
GFR calc non Af Amer: 16 mL/min — ABNORMAL LOW (ref >60)
GFR, EST AFRICAN AMERICAN: 18 mL/min — AB (ref >60)

## 2017-11-09 LAB — CREATININE, URINE, RANDOM: Creatinine, Urine: 116.01 mg/dL

## 2017-11-09 MED ORDER — PIPERACILLIN-TAZOBACTAM IN DEX 2-0.25 GM/50ML IV SOLN
2.2500 g | Freq: Four times a day (QID) | INTRAVENOUS | Status: DC
  Administered 2017-11-09 – 2017-11-10 (×4): 2.25 g via INTRAVENOUS
  Filled 2017-11-09 (×5): qty 50

## 2017-11-09 MED ORDER — IOHEXOL 300 MG/ML  SOLN
30.0000 mL | Freq: Once | INTRAMUSCULAR | Status: AC | PRN
  Administered 2017-11-09: 30 mL via ORAL

## 2017-11-09 NOTE — Progress Notes (Addendum)
Pharmacy Antibiotic Note  Aleczander Fandino is a 77 y.o. male admitted on 11/07/2017 with pseudomonas bacteremia.  Pharmacy has been consulted for zosyn dosing. Patient with CKD, s/p renal transplant.  Today, 11/09/2017  SCr trending up  Plan:  Adjust zosyn to 2.25gm IV q6h   F/u susceptibilities and narrow accordingly (if no need for anaerobic coverage)  Height: 5\' 7"  (170.2 cm) Weight: 165 lb (74.8 kg) IBW/kg (Calculated) : 66.1  Temp (24hrs), Avg:98.5 F (36.9 C), Min:98 F (36.7 C), Max:99.2 F (37.3 C)  Recent Labs  Lab 11/07/17 1639  11/07/17 1904 11/07/17 2214 11/08/17 0132 11/08/17 0430 11/08/17 0755 11/09/17 0549 11/09/17 0752  WBC 18.9*  --   --   --   --  13.8*  --   --  10.6*  CREATININE 2.54*  --   --   --   --  2.86*  --  3.49* 3.57*  LATICACIDVEN  --    < > 2.9* 2.7* 2.3* 1.8 1.2  --   --    < > = values in this interval not displayed.    Estimated Creatinine Clearance: 16.2 mL/min (A) (by C-G formula based on SCr of 3.57 mg/dL (H)).    Allergies  Allergen Reactions  . Norvasc [Amlodipine Besylate]     Pt state it cause him weight gain  . Tape Other (See Comments)    Antimicrobials this admission:  5/22 vanc>> 5/23 5/22 zosyn>> 5/23 resumed >>  Dose adjustments this admission:   Microbiology results:  5/22 BCx x2:  2 of 2 GNR> BCID with PsA (susc pending) 5/22 UCx: NG 5/23 GI panel: neg 5/24 BCx:   Thank you for allowing pharmacy to be a part of this patient's care.  Doreene Eland, PharmD, BCPS.   Pager: [phone] 11/09/2017 10:49 AM

## 2017-11-09 NOTE — Progress Notes (Signed)
Physical Therapy Treatment Patient Details Name: Caleb Hicks MRN: [id number] DOB: 1941/04/11 Today's Date: 11/09/2017    History of Present Illness Pt was admitted for dehydration/gastroenteritis, AKI on CKD.  He has a history of CKD, kidney transplant and HTN    PT Comments    Progressing with mobility.    Follow Up Recommendations  Home health PT     Equipment Recommendations  None recommended by PT    Recommendations for Other Services       Precautions / Restrictions Precautions Precautions: Fall Precaution Comments: diarrhea Restrictions Weight Bearing Restrictions: No    Mobility  Bed Mobility Overal bed mobility: Needs Assistance Bed Mobility: Supine to Sit;Sit to Supine     Supine to sit: Supervision Sit to supine: Supervision   General bed mobility comments: used bedrails. Increased time.   Transfers Overall transfer level: Needs assistance Equipment used: Rolling walker (2 wheeled) Transfers: Sit to/from Stand Sit to Stand: Min guard         General transfer comment: close guard for safety. VCs hand placement  Ambulation/Gait Ambulation/Gait assistance: Min guard Ambulation Distance (Feet): 400 Feet Assistive device: Rolling walker (2 wheeled) Gait Pattern/deviations: Step-through pattern;Decreased stride length     General Gait Details: close guard for safety. Pt tolerated distance well. No LOB with RW use.    Stairs             Wheelchair Mobility    Modified Rankin (Stroke Patients Only)       Balance                                            Cognition Arousal/Alertness: Awake/alert Behavior During Therapy: WFL for tasks assessed/performed Overall Cognitive Status: Within Functional Limits for tasks assessed                                        Exercises      General Comments        Pertinent Vitals/Pain Pain Assessment: No/denies pain    Home Living                      Prior Function            PT Goals (current goals can now be found in the care plan section) Progress towards PT goals: Progressing toward goals    Frequency    Min 3X/week      PT Plan Current plan remains appropriate    Co-evaluation              AM-PAC PT "6 Clicks" Daily Activity  Outcome Measure  Difficulty turning over in bed (including adjusting bedclothes, sheets and blankets)?: A Little Difficulty moving from lying on back to sitting on the side of the bed? : A Little Difficulty sitting down on and standing up from a chair with arms (e.g., wheelchair, bedside commode, etc,.)?: A Little Help needed moving to and from a bed to chair (including a wheelchair)?: A Little Help needed walking in hospital room?: A Little Help needed climbing 3-5 steps with a railing? : A Little 6 Click Score: 18    End of Session   Activity Tolerance: Patient tolerated treatment well Patient left: in bed;with call bell/phone within reach   PT Visit Diagnosis:  Unsteadiness on feet (R26.81)     Time: 8657-8469 PT Time Calculation (min) (ACUTE ONLY): 12 min  Charges:  $Gait Training: 8-22 mins                    G Codes:          Weston Anna, MPT Pager: [phone]

## 2017-11-09 NOTE — Care Management Obs Status (Signed)
[place] NOTIFICATION   Patient Details  Name: Caleb Hicks MRN: [id number] Date of Birth: [birth year]   Medicare Observation Status Notification Given:  Yes    Dessa Phi, RN 11/09/2017, 11:27 AM

## 2017-11-09 NOTE — Care Management Note (Signed)
Case Management Note  Patient Details  Name: Caleb Hicks MRN: [id number] Date of Birth: 06-02-1941  Subjective/Objective:  PT recc HHPT. Spoke to patient/dtr in rm about d/c plans-chose [facility] for [employer] aware. HHPT,aide, await face to face order. Patient will stay w/dtr:[facility] [id number] [place] [id number]-[facility] aware.                  Action/Plan:d/c home w/[facility]HC.   Expected Discharge Date:  (unknown)               Expected Discharge Plan:  [place]  In-House Referral:     Discharge planning Services  CM Consult  Post Acute Care Choice:  Durable Medical Equipment(Has rw) Choice offered to:  Patient  DME Arranged:    DME Agency:     HH Arranged:  PT, Nurse's Aide [place] Agency:  [place]  Status of Service:  In process, will continue to follow  If discussed at Long Length of Stay Meetings, dates discussed:    Additional Comments:  Dessa Phi, RN 11/09/2017, 12:46 PM

## 2017-11-09 NOTE — Plan of Care (Signed)
  Problem: Health Behavior/Discharge Planning: Goal: Ability to manage health-related needs will improve Outcome: Progressing   Problem: Clinical Measurements: Goal: Ability to maintain clinical measurements within normal limits will improve Outcome: Progressing Goal: Will remain free from infection Outcome: Progressing Note:  Blood cultures + pseudomonas.  On IV abx.  Goal: Diagnostic test results will improve Outcome: Progressing Goal: Cardiovascular complication will be avoided Outcome: Progressing   Problem: Activity: Goal: Risk for activity intolerance will decrease Outcome: Progressing Note:  PT eval pending   Problem: Nutrition: Goal: Adequate nutrition will be maintained Outcome: Progressing   Problem: Elimination: Goal: Will not experience complications related to bowel motility Outcome: Progressing   Problem: Safety: Goal: Ability to remain free from injury will improve Outcome: Progressing   Problem: Skin Integrity: Goal: Risk for impaired skin integrity will decrease Outcome: Progressing   Problem: Fluid Volume: Goal: Hemodynamic stability will improve Outcome: Progressing   Problem: Clinical Measurements: Goal: Diagnostic test results will improve Outcome: Progressing Goal: Signs and symptoms of infection will decrease Outcome: Progressing

## 2017-11-09 NOTE — Progress Notes (Signed)
Assumed care of this patient from prior RN. Agree with previous assessment. Will continue to monitor patient closely.  

## 2017-11-09 NOTE — Progress Notes (Signed)
[facility] Progress Note  Patient: Caleb Hicks NUU:[id number]   PCP: Patient, No Pcp Per DOB: Dec 06, 1940   DOA: 11/07/2017   DOS: 11/09/2017   Date of Service: the patient was seen and examined on 11/09/2017  Subjective: Patient is feeling better but still had 8 bowel movements yesterday and for bowel movement so far today.  One episode of vomiting yesterday, some nausea this morning which currently has resolved.  Fatigue is getting better.  No fever.  Brief hospital course: Pt. with PMH of CKD stage 3,sp renal transplant, IgA nephropathy, HTN, HLD, AAA; admitted on 11/07/2017, presented with complaint of generalized weakness and abdominal discomfort with nausea and diarrhea, was found to have viral gastroenteritis with secondary adrenal insufficiency. Currently further plan is continue IV hydration and increased steroid.  Assessment and Plan: Sepsis due to Pseudomonas bacteremia. Etiology of the bacteremia is still unclear.  Patient denies having a recent procedure. Primary complaint is abdominal with nausea vomiting and diarrhea. We will get CT abdominal pelvis without contrast to rule out any acute abnormality. Patient is currently on IV Zosyn to cover intra-abdominal pathogens. Sensitivities still pending.  Initially thought that the patient has a viral gastroenteritis likely cause of nausea vomiting and loose BM if has persistent diarrhea GI pathogen panel is negative.  Acute kidney injury superimposed on CKD [facility]): Stage 4 Hyperkalemia Presented with dehydration, continue IV hydration and follow kidney function obtain urine electrolytes Reduce rate of the fluid from 75.  History of renal transplant will continue home medications check cyclosporine level trough Increase steroid from 5 mg to twice daily for stress dose.  HTN (hypertension) Stable continue metoprolol but hold other blood pressure medications while dehydrated resume as able  Hyperlipidemia  stable continue  medications  Iron deficiency anemia Chronic currently stable  Diet: Renal diet DVT Prophylaxis: subcutaneous Heparin  Advance goals of care discussion: DNR DNI  Family Communication: family was present at bedside, at the time of interview. The pt provided permission to discuss medical plan with the family. Opportunity was given to ask question and all questions were answered satisfactorily.  Disposition:  Discharge to home.  Consultants: none Procedures: none  Antibiotics: Anti-infectives (From admission, onward)   Start     Dose/Rate Route Frequency Ordered Stop   11/09/17 1700  vancomycin (VANCOCIN) IVPB 1000 mg/200 mL premix  Status:  Discontinued     1,000 mg 200 mL/hr over 60 Minutes Intravenous Every 48 hours 11/07/17 1943 11/08/17 1040   11/09/17 1200  piperacillin-tazobactam (ZOSYN) IVPB 2.25 g     2.25 g 100 mL/hr over 30 Minutes Intravenous Every 6 hours 11/09/17 1052     11/08/17 2000  piperacillin-tazobactam (ZOSYN) IVPB 3.375 g  Status:  Discontinued     3.375 g 12.5 mL/hr over 240 Minutes Intravenous Every 8 hours 11/08/17 1826 11/09/17 1052   11/07/17 2300  piperacillin-tazobactam (ZOSYN) IVPB 3.375 g  Status:  Discontinued     3.375 g 12.5 mL/hr over 240 Minutes Intravenous Every 8 hours 11/07/17 1943 11/08/17 1040   11/07/17 1645  vancomycin (VANCOCIN) IVPB 1000 mg/200 mL premix  Status:  Discontinued     1,000 mg 200 mL/hr over 60 Minutes Intravenous Every 12 hours 11/07/17 1637 11/07/17 1639   11/07/17 1645  piperacillin-tazobactam (ZOSYN) IVPB 3.375 g     3.375 g 100 mL/hr over 30 Minutes Intravenous  Once 11/07/17 1637 11/07/17 1735   11/07/17 1645  vancomycin (VANCOCIN) IVPB 1000 mg/200 mL premix     1,000 mg 200  mL/hr over 60 Minutes Intravenous  Once 11/07/17 1639 11/07/17 1803       Objective: Physical Exam: Vitals:   11/08/17 2008 11/09/17 0006 11/09/17 0431 11/09/17 0934  BP: (!) 149/66 (!) 165/73 (!) 155/72 (!) 186/80  Pulse: 70 (!) 56 66  (!) 55  Resp: 20 18 20 18   Temp: 99.2 F (37.3 C) 98.7 F (37.1 C) 98 F (36.7 C) 98.1 F (36.7 C)  TempSrc: Oral Oral Oral Oral  SpO2: 98% 98% 97% 98%  Weight:      Height:        Intake/Output Summary (Last 24 hours) at 11/09/2017 1636 Last data filed at 11/09/2017 1400 Gross per 24 hour  Intake 2235 ml  Output -  Net 2235 ml   Filed Weights   11/07/17 1623  Weight: 74.8 kg (165 lb)   General: Alert, Awake and Oriented to Time, Place and Person. Appear in moderate distress, affect flat Eyes: PERRL, Conjunctiva normal ENT: Oral Mucosa clear moist. Neck: no JVD, no Abnormal Mass Or lumps Cardiovascular: S1 and S2 Present, no Murmur, Peripheral Pulses Present Respiratory: normal respiratory effort, Bilateral Air entry equal and Decreased, no use of accessory muscle, Clear to Auscultation, no Crackles, no wheezes Abdomen: Bowel Sound present, Soft and no tenderness, no hernia Skin: no redness, no Rash, no induration Extremities: no Pedal edema, no calf tenderness Neurologic: Grossly no focal neuro deficit. Bilaterally Equal motor strength  Data Reviewed: CBC: Recent Labs  Lab 11/07/17 1639 11/08/17 0430 11/09/17 0752  WBC 18.9* 13.8* 10.6*  NEUTROABS 18.2*  --  9.7*  HGB 11.6* 9.7* 9.7*  HCT 36.4* 30.7* 30.7*  MCV 94.5 95.3 94.8  PLT 242 197 811   Basic Metabolic Panel: Recent Labs  Lab 11/07/17 1639 11/08/17 0430 11/08/17 0755 11/08/17 1614 11/09/17 0549 11/09/17 0752  NA 141 137  --   --   --  140  K 4.4 5.5* 5.3* 4.3  --  4.4  CL 107 107  --   --   --  111  CO2 20* 19*  --   --   --  18*  GLUCOSE 161* 149*  --   --   --  137*  BUN 78* 76*  --   --   --  76*  CREATININE 2.54* 2.86*  --   --  3.49* 3.57*  CALCIUM 9.1 7.8*  --   --   --  7.9*  MG  --  1.2*  --  2.1  --  1.9  PHOS  --  4.2  --   --   --   --     Liver Function Tests: Recent Labs  Lab 11/07/17 1639 11/08/17 0430 11/09/17 0752  AST 25 25 22   ALT 12* 13* 14*  ALKPHOS 61 46 46    BILITOT 0.5 0.8 0.5  PROT 6.2* 5.0* 5.3*  ALBUMIN 2.8* 2.3* 2.2*   Recent Labs  Lab 11/07/17 1639  LIPASE 33   No results for input(s): AMMONIA in the last 168 hours. Coagulation Profile: Recent Labs  Lab 11/07/17 2210  INR 1.08   Cardiac Enzymes: No results for input(s): CKTOTAL, CKMB, CKMBINDEX, TROPONINI in the last 168 hours. BNP (last 3 results) No results for input(s): PROBNP in the last 8760 hours. CBG: No results for input(s): GLUCAP in the last 168 hours. Studies: No results found.  Scheduled Meds: . allopurinol  100 mg Oral BID  . atorvastatin  20 mg Oral Daily  . cycloSPORINE modified  50 mg Oral QHS  . cycloSPORINE modified  75 mg Oral Daily  . heparin injection (subcutaneous)  5,000 Units Subcutaneous Q8H  . metoprolol tartrate  50 mg Oral BID  . predniSONE  5 mg Oral BID WC   Continuous Infusions: . sodium chloride 75 mL/hr at 11/09/17 1223  . piperacillin-tazobactam (ZOSYN)  IV Stopped (11/09/17 1223)   PRN Meds: acetaminophen **OR** acetaminophen, ondansetron **OR** ondansetron (ZOFRAN) IV  Time spent: 35 minutes  Author: Berle Mull, MD [facility] Pager: [phone] 11/09/2017 4:36 PM  If 7PM-7AM, please contact night-coverage at [email or web address], password [facility]

## 2017-11-10 LAB — CBC WITH DIFFERENTIAL/PLATELET
BASOS ABS: 0 10*3/uL (ref 0.0–0.1)
Basophils Relative: 0 %
EOS ABS: 0.1 10*3/uL (ref 0.0–0.7)
EOS PCT: 1 %
HCT: 26.5 % — ABNORMAL LOW (ref 39.0–52.0)
Hemoglobin: 8.6 g/dL — ABNORMAL LOW (ref 13.0–17.0)
LYMPHS PCT: 11 %
Lymphs Abs: 0.8 10*3/uL (ref 0.7–4.0)
MCH: 29.7 pg (ref 26.0–34.0)
MCHC: 32.5 g/dL (ref 30.0–36.0)
MCV: 91.4 fL (ref 78.0–100.0)
Monocytes Absolute: 0.4 10*3/uL (ref 0.1–1.0)
Monocytes Relative: 5 %
Neutro Abs: 5.9 10*3/uL (ref 1.7–7.7)
Neutrophils Relative %: 83 %
PLATELETS: 174 10*3/uL (ref 150–400)
RBC: 2.9 MIL/uL — AB (ref 4.22–5.81)
RDW: 15.1 % (ref 11.5–15.5)
WBC: 7.2 10*3/uL (ref 4.0–10.5)

## 2017-11-10 LAB — COMPREHENSIVE METABOLIC PANEL
ALBUMIN: 2.1 g/dL — AB (ref 3.5–5.0)
ALT: 11 U/L — AB (ref 17–63)
AST: 20 U/L (ref 15–41)
Alkaline Phosphatase: 38 U/L (ref 38–126)
Anion gap: 12 (ref 5–15)
BUN: 80 mg/dL — AB (ref 6–20)
CHLORIDE: 112 mmol/L — AB (ref 101–111)
CO2: 14 mmol/L — AB (ref 22–32)
CREATININE: 3.99 mg/dL — AB (ref 0.61–1.24)
Calcium: 7.9 mg/dL — ABNORMAL LOW (ref 8.9–10.3)
GFR calc Af Amer: 15 mL/min — ABNORMAL LOW (ref >60)
GFR calc non Af Amer: 13 mL/min — ABNORMAL LOW (ref >60)
GLUCOSE: 168 mg/dL — AB (ref 65–99)
Potassium: 4.5 mmol/L (ref 3.5–5.1)
SODIUM: 138 mmol/L (ref 135–145)
Total Bilirubin: 0.4 mg/dL (ref 0.3–1.2)
Total Protein: 4.8 g/dL — ABNORMAL LOW (ref 6.5–8.1)

## 2017-11-10 LAB — CREATININE, SERUM
Creatinine, Ser: 3.8 mg/dL — ABNORMAL HIGH (ref 0.61–1.24)
GFR calc non Af Amer: 14 mL/min — ABNORMAL LOW (ref >60)
GFR, EST AFRICAN AMERICAN: 16 mL/min — AB (ref >60)

## 2017-11-10 MED ORDER — PIPERACILLIN-TAZOBACTAM IN DEX 2-0.25 GM/50ML IV SOLN
2.2500 g | Freq: Four times a day (QID) | INTRAVENOUS | Status: DC
  Administered 2017-11-10 – 2017-11-12 (×6): 2.25 g via INTRAVENOUS
  Filled 2017-11-10 (×9): qty 50

## 2017-11-10 MED ORDER — SODIUM BICARBONATE 8.4 % IV SOLN
INTRAVENOUS | Status: DC
  Administered 2017-11-10 – 2017-11-12 (×4): via INTRAVENOUS
  Filled 2017-11-10 (×4): qty 150

## 2017-11-10 MED ORDER — SODIUM CHLORIDE 0.9 % IV BOLUS: 500.0000 mL | Freq: Once | INTRAVENOUS | Status: DC

## 2017-11-10 MED ORDER — METRONIDAZOLE IN NACL 5-0.79 MG/ML-% IV SOLN: 500.0000 mg | Freq: Three times a day (TID) | INTRAVENOUS | Status: DC

## 2017-11-10 NOTE — Progress Notes (Signed)
Patient has scheduled metoprolol, but HR is sustaining in 50's. On call provider paged for parameters on medication. Parameters obtained. Metoprolol held.

## 2017-11-10 NOTE — Progress Notes (Signed)
[facility] Progress Note  Patient: Caleb Hicks VEL:[id number]   PCP: Patient, No Pcp Per DOB: [birth year]   DOA: 11/07/2017   DOS: 11/10/2017   Date of Service: the patient was seen and examined on 11/10/2017  Subjective: Feeling better, no nausea no vomiting.  No diarrhea.  Urine output is minimal.  Brief hospital course: Pt. with PMH of CKD stage 3,sp renal transplant, IgA nephropathy, HTN, HLD, AAA; admitted on 11/07/2017, presented with complaint of generalized weakness and abdominal discomfort with nausea and diarrhea, was found to have viral gastroenteritis with secondary adrenal insufficiency. Currently further plan is continue IV hydration and increased steroid.  Assessment and Plan: Sepsis due to Pseudomonas bacteremia. Mild sigmoid diverticulitis. Etiology of the bacteremia is still unclear.  Patient denies having a recent procedure. Primary complaint is abdominal with nausea vomiting and diarrhea. The abdomen pelvis is positive for mild sigmoid diverticulitis without any perforation. Patient is currently on IV Zosyn to cover intra-abdominal pathogens. Continue the same for now and transition to oral Levaquin when the patient is ready to be discharged from the hospital. GI pathogen panel is negative.  Acute kidney injury superimposed on CKD [facility]): Stage 4 Hyperkalemia Presented with dehydration, continue IV hydration and follow kidney function obtain urine electrolytes Changing IV fluids to bicarb.  Monitor in and out and strict daily weight.  Suspecting ATN based on admission FeNa  History of renal transplant will continue home medications check cyclosporine level trough Increase steroid from 5 mg to twice daily for stress dose. No adjustment of cyclosporine indicated at present  HTN (hypertension) Stable continue metoprolol but hold other blood pressure medications while dehydrated resume as able  Hyperlipidemia  stable continue medications  Iron deficiency  anemia Chronic currently stable  Diet: Renal diet DVT Prophylaxis: subcutaneous Heparin  Advance goals of care discussion: DNR DNI  Family Communication: no family was present at bedside, at the time of interview.  Disposition:  Discharge to home.  Consultants: none Procedures: none  Antibiotics: Anti-infectives (From admission, onward)   Start     Dose/Rate Route Frequency Ordered Stop   11/10/17 1200  piperacillin-tazobactam (ZOSYN) IVPB 2.25 g     2.25 g 100 mL/hr over 30 Minutes Intravenous Every 6 hours 11/10/17 1037     11/10/17 1000  metroNIDAZOLE (FLAGYL) IVPB 500 mg  Status:  Discontinued     500 mg 100 mL/hr over 60 Minutes Intravenous Every 8 hours 11/10/17 0923 11/10/17 0958   11/09/17 1700  vancomycin (VANCOCIN) IVPB 1000 mg/200 mL premix  Status:  Discontinued     1,000 mg 200 mL/hr over 60 Minutes Intravenous Every 48 hours 11/07/17 1943 11/08/17 1040   11/09/17 1200  piperacillin-tazobactam (ZOSYN) IVPB 2.25 g  Status:  Discontinued     2.25 g 100 mL/hr over 30 Minutes Intravenous Every 6 hours 11/09/17 1052 11/10/17 0923   11/08/17 2000  piperacillin-tazobactam (ZOSYN) IVPB 3.375 g  Status:  Discontinued     3.375 g 12.5 mL/hr over 240 Minutes Intravenous Every 8 hours 11/08/17 1826 11/09/17 1052   11/07/17 2300  piperacillin-tazobactam (ZOSYN) IVPB 3.375 g  Status:  Discontinued     3.375 g 12.5 mL/hr over 240 Minutes Intravenous Every 8 hours 11/07/17 1943 11/08/17 1040   11/07/17 1645  vancomycin (VANCOCIN) IVPB 1000 mg/200 mL premix  Status:  Discontinued     1,000 mg 200 mL/hr over 60 Minutes Intravenous Every 12 hours 11/07/17 1637 11/07/17 1639   11/07/17 1645  piperacillin-tazobactam (ZOSYN) IVPB 3.375 g  3.375 g 100 mL/hr over 30 Minutes Intravenous  Once 11/07/17 1637 11/07/17 1735   11/07/17 1645  vancomycin (VANCOCIN) IVPB 1000 mg/200 mL premix     1,000 mg 200 mL/hr over 60 Minutes Intravenous  Once 11/07/17 1639 11/07/17 1803        Objective: Physical Exam: Vitals:   11/09/17 0934 11/09/17 2104 11/10/17 0537 11/10/17 1413  BP: (!) 186/80 (!) 155/65 (!) 156/60 (!) 167/65  Pulse: (!) 55 (!) 52 (!) 48 (!) 49  Resp: 18 (!) 22 16 (!) 21  Temp: 98.1 F (36.7 C) (!) 97.5 F (36.4 C) 97.7 F (36.5 C) 97.7 F (36.5 C)  TempSrc: Oral  Oral Oral  SpO2: 98% 99% 100% 100%  Weight:      Height:        Intake/Output Summary (Last 24 hours) at 11/10/2017 1725 Last data filed at 11/10/2017 1400 Gross per 24 hour  Intake 2110 ml  Output 200 ml  Net 1910 ml   Filed Weights   11/07/17 1623  Weight: 74.8 kg (165 lb)   General: Alert, Awake and Oriented to Time, Place and Person. Appear in moderate distress, affect flat Eyes: PERRL, Conjunctiva normal ENT: Oral Mucosa clear moist. Neck: no JVD, no Abnormal Mass Or lumps Cardiovascular: S1 and S2 Present, no Murmur, Peripheral Pulses Present Respiratory: normal respiratory effort, Bilateral Air entry equal and Decreased, no use of accessory muscle, Clear to Auscultation, no Crackles, no wheezes Abdomen: Bowel Sound present, Soft and no tenderness, no hernia Skin: no redness, no Rash, no induration Extremities: no Pedal edema, no calf tenderness Neurologic: Grossly no focal neuro deficit. Bilaterally Equal motor strength  Data Reviewed: CBC: Recent Labs  Lab 11/07/17 1639 11/08/17 0430 11/09/17 0752 11/10/17 1200  WBC 18.9* 13.8* 10.6* 7.2  NEUTROABS 18.2*  --  9.7* 5.9  HGB 11.6* 9.7* 9.7* 8.6*  HCT 36.4* 30.7* 30.7* 26.5*  MCV 94.5 95.3 94.8 91.4  PLT 242 197 177 194   Basic Metabolic Panel: Recent Labs  Lab 11/07/17 1639 11/08/17 0430 11/08/17 0755 11/08/17 1614 11/09/17 0549 11/09/17 0752 11/10/17 0516 11/10/17 1200  NA 141 137  --   --   --  140  --  138  K 4.4 5.5* 5.3* 4.3  --  4.4  --  4.5  CL 107 107  --   --   --  111  --  112*  CO2 20* 19*  --   --   --  18*  --  14*  GLUCOSE 161* 149*  --   --   --  137*  --  168*  BUN 78* 76*  --    --   --  76*  --  80*  CREATININE 2.54* 2.86*  --   --  3.49* 3.57* 3.80* 3.99*  CALCIUM 9.1 7.8*  --   --   --  7.9*  --  7.9*  MG  --  1.2*  --  2.1  --  1.9  --   --   PHOS  --  4.2  --   --   --   --   --   --     Liver Function Tests: Recent Labs  Lab 11/07/17 1639 11/08/17 0430 11/09/17 0752 11/10/17 1200  AST 25 25 22 20   ALT 12* 13* 14* 11*  ALKPHOS 61 46 46 38  BILITOT 0.5 0.8 0.5 0.4  PROT 6.2* 5.0* 5.3* 4.8*  ALBUMIN 2.8* 2.3* 2.2* 2.1*  Recent Labs  Lab 11/07/17 1639  LIPASE 33   No results for input(s): AMMONIA in the last 168 hours. Coagulation Profile: Recent Labs  Lab 11/07/17 2210  INR 1.08   Cardiac Enzymes: No results for input(s): CKTOTAL, CKMB, CKMBINDEX, TROPONINI in the last 168 hours. BNP (last 3 results) No results for input(s): PROBNP in the last 8760 hours. CBG: No results for input(s): GLUCAP in the last 168 hours. Studies: Ct Abdomen Pelvis Wo Contrast  Result Date: 11/09/2017 CLINICAL DATA:  Stage III renal disease with renal transplant and IgA nephropathy. Patient presents complaining of generalized weakness and abdominal discomfort with nausea and diarrhea. EXAM: CT ABDOMEN AND PELVIS WITHOUT CONTRAST TECHNIQUE: Multidetector CT imaging of the abdomen and pelvis was performed following the standard protocol without IV contrast. COMPARISON:  03/24/2017 CT FINDINGS: Lower chest: Heart is top-normal in size. Coronary arteriosclerosis is noted along the LAD and left circumflex. Thoracic aortic atherosclerosis is noted as well. No pericardial effusion is seen. Trace pleural effusions with bibasilar atelectasis. Mild bilateral lower lobe cylindrical bronchiectasis. Hepatobiliary: Distended gallbladder without mural thickening or pericholecystic fluid. Dependent calculi and/or biliary sludge is redemonstrated. The unenhanced liver is unremarkable. Pancreas: No ductal dilatation, mass or inflammation. Spleen: Normal in size without focal abnormality.  Adrenals/Urinary Tract: No adrenal mass. Marked renal cortical thinning with hypertrophy of the medullary sinus fat within both kidneys. Redemonstration of simple and complex cystic masses of both kidneys. A more homogeneous hyperdense cyst possibly proteinaceous or hemorrhagic is redemonstrated within the right kidney measuring 11 mm in diameter. Transplanted right iliac fossa renal cyst with no focal mass or obstruction identified. Decompressed urinary bladder without focal abnormality. Stomach/Bowel: Mild pericolonic inflammation associated with the sigmoid colon secondary to acute uncomplicated mild diverticulitis. Redemonstration of distal colonic and sigmoid diverticulosis with circular muscle hypertrophy. No bowel obstruction is seen. Contrast distended stomach. Normal small bowel rotation without obstruction or inflammation. Normal appendix. Vascular/Lymphatic: Moderate aortoiliac atherosclerosis with infrarenal abdominal aortic aneurysm measuring up to 3.5 cm as before. 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 Reproductive: Prostate is unremarkable. Other: Mild fat containing supraumbilical fat containing ventral hernia. Musculoskeletal: Marked disc flattening and vacuum disc phenomenon at L4-5 with grade 1 spondylolisthesis of L4 on L5. No pars defects. No suspicious osseous lesions. IMPRESSION: 1. Acute uncomplicated distal sigmoid diverticulitis with mild pericolonic inflammation. 2. Marked atrophy of the native renal kidneys with simple and complex cysts as before. Stable appearing transplanted right iliac fossa kidney. 3. Stable 3.5 cm infrarenal abdominal aortic aneurysm. Recommend followup by ultrasound in 2 years. This recommendation follows ACR consensus guidelines: White Paper of the ACR Incidental Findings Committee II on Vascular Findings. J Am Coll Radiol 2013; 60:454-098. 4. Uncomplicated cholelithiasis. 5. Coronary arteriosclerosis. Electronically  Signed   By: Ashley Royalty M.D.   On: 11/09/2017 22:33    Scheduled Meds: . cycloSPORINE modified  50 mg Oral QHS  . cycloSPORINE modified  75 mg Oral Daily  . heparin injection (subcutaneous)  5,000 Units Subcutaneous Q8H  . predniSONE  5 mg Oral BID WC   Continuous Infusions: . piperacillin-tazobactam (ZOSYN)  IV Stopped (11/10/17 1219)  .  sodium bicarbonate  infusion 1000 mL 100 mL/hr at 11/10/17 1539   PRN Meds: acetaminophen **OR** acetaminophen, ondansetron **OR** ondansetron (ZOFRAN) IV  Time spent: 20 minutes  Author: Berle Mull, MD [facility] Pager: [phone] 11/10/2017 5:25 PM  If 7PM-7AM, please contact night-coverage at [email or web address], password [facility]

## 2017-11-10 NOTE — Progress Notes (Signed)
OT Cancellation Note  Patient Details Name: Noboru Bidinger MRN: [id number] DOB: 1940/11/29   Cancelled Treatment:     Pt had just gotten back to bed.  Will check back on pt as schedule allows.  Mickel Baas [place], [place] 11/10/2017, 2:22 PM

## 2017-11-10 NOTE — Progress Notes (Signed)
Patient's HR dropped down to 33 but did not sustain, asymptomatic. On call provider, [place], made aware. No new orders. Will continue to monitor.

## 2017-11-10 NOTE — Progress Notes (Signed)
Patient's B/P 173/59(91 for MAP);HR 44. The PCP was notified. Awaiting any new orders.

## 2017-11-11 ENCOUNTER — Inpatient Hospital Stay (HOSPITAL_COMMUNITY): Payer: Medicare Other | Admitting: Certified Registered Nurse Anesthetist

## 2017-11-11 LAB — CBC
HCT: 25 % — ABNORMAL LOW (ref 39.0–52.0)
Hemoglobin: 8.3 g/dL — ABNORMAL LOW (ref 13.0–17.0)
MCH: 30.9 pg (ref 26.0–34.0)
MCHC: 33.2 g/dL (ref 30.0–36.0)
MCV: 92.9 fL (ref 78.0–100.0)
PLATELETS: 140 10*3/uL — AB (ref 150–400)
RBC: 2.69 MIL/uL — AB (ref 4.22–5.81)
RDW: 15 % (ref 11.5–15.5)
WBC: 4.9 10*3/uL (ref 4.0–10.5)

## 2017-11-11 LAB — RENAL FUNCTION PANEL
ANION GAP: 10 (ref 5–15)
Albumin: 2 g/dL — ABNORMAL LOW (ref 3.5–5.0)
BUN: 81 mg/dL — ABNORMAL HIGH (ref 6–20)
CALCIUM: 7.9 mg/dL — AB (ref 8.9–10.3)
CO2: 21 mmol/L — AB (ref 22–32)
Chloride: 106 mmol/L (ref 101–111)
Creatinine, Ser: 4.25 mg/dL — ABNORMAL HIGH (ref 0.61–1.24)
GFR calc non Af Amer: 12 mL/min — ABNORMAL LOW (ref >60)
GFR, EST AFRICAN AMERICAN: 14 mL/min — AB (ref >60)
Glucose, Bld: 228 mg/dL — ABNORMAL HIGH (ref 65–99)
Phosphorus: 5.2 mg/dL — ABNORMAL HIGH (ref 2.5–4.6)
Potassium: 4 mmol/L (ref 3.5–5.1)
SODIUM: 137 mmol/L (ref 135–145)

## 2017-11-11 LAB — CULTURE, BLOOD (ROUTINE X 2)

## 2017-11-11 LAB — MAGNESIUM: Magnesium: 1.9 mg/dL (ref 1.7–2.4)

## 2017-11-11 LAB — CYCLOSPORINE: Cyclosporine, LabCorp: 115 ng/mL (ref 100–400)

## 2017-11-11 MED ORDER — BENZONATATE 100 MG PO CAPS
100.0000 mg | ORAL_CAPSULE | Freq: Three times a day (TID) | ORAL | Status: DC | PRN
  Administered 2017-11-11: 100 mg via ORAL
  Filled 2017-11-11: qty 1

## 2017-11-11 MED ORDER — HYDRALAZINE HCL 25 MG PO TABS: 25.0000 mg | ORAL_TABLET | Freq: Three times a day (TID) | ORAL | Status: DC

## 2017-11-11 MED ORDER — SODIUM BICARBONATE 650 MG PO TABS
1300.0000 mg | ORAL_TABLET | Freq: Three times a day (TID) | ORAL | Status: DC
  Administered 2017-11-11: 1300 mg via ORAL
  Filled 2017-11-11: qty 2

## 2017-11-11 MED ORDER — HYDRALAZINE HCL 50 MG PO TABS
50.0000 mg | ORAL_TABLET | Freq: Three times a day (TID) | ORAL | Status: DC
  Administered 2017-11-11 – 2017-11-12 (×3): 50 mg via ORAL
  Filled 2017-11-11 (×3): qty 1

## 2017-11-11 MED ORDER — LEVOFLOXACIN 500 MG PO TABS
500.0000 mg | ORAL_TABLET | Freq: Once | ORAL | Status: AC
  Administered 2017-11-11: 500 mg via ORAL
  Filled 2017-11-11: qty 1

## 2017-11-11 MED ORDER — HYDRALAZINE HCL 25 MG PO TABS
25.0000 mg | ORAL_TABLET | Freq: Four times a day (QID) | ORAL | Status: DC
Start: 2017-11-11 — End: 2017-11-11
  Administered 2017-11-11: 25 mg via ORAL
  Filled 2017-11-11: qty 1

## 2017-11-11 NOTE — Progress Notes (Signed)
Patient now has a patent PIV placed by anesthesia. There are still orders for IV and oral Sodium Bicarbonate. PCP was notified.

## 2017-11-11 NOTE — Plan of Care (Signed)
  Problem: Health Behavior/Discharge Planning: Goal: Ability to manage health-related needs will improve Outcome: Progressing   Problem: Clinical Measurements: Goal: Will remain free from infection Outcome: Progressing Note:  IV abx for pseudomonas.  Goal: Diagnostic test results will improve Outcome: Progressing Goal: Cardiovascular complication will be avoided Outcome: Progressing   Problem: Skin Integrity: Goal: Risk for impaired skin integrity will decrease Outcome: Progressing   Problem: Fluid Volume: Goal: Hemodynamic stability will improve Outcome: Progressing   Problem: Clinical Measurements: Goal: Diagnostic test results will improve Outcome: Progressing Goal: Signs and symptoms of infection will decrease Outcome: Progressing

## 2017-11-11 NOTE — Progress Notes (Signed)
Dr Posey Pronto made aware of pt's bradycardia and HTN overnight. He is aware pt is not on telemetry and states this is acceptable. New orders for hydralazine noted.

## 2017-11-11 NOTE — Progress Notes (Signed)
Pt lost IV access at 1200 d/t significant leaking. IV team has attempted IV insertion with ultrasound x 3 unsuccessfully. Pt is not a PICC candidate d/t elevated creatinine. Dr Posey Pronto made aware and requests that next shift IV team (after 7pm) and/or anesthesia attempt PIV insertion.  Spoke with ICU CN who defers IV attempt after IV team attempted x 3 with ultrasound. Anesthesia contacted; they have back to back cases until after 1900 but said they will check back then. IV team contacted and will ask night shift IV RN to see pt and attempt PIV insertion. Dr Posey Pronto aware.

## 2017-11-11 NOTE — Progress Notes (Signed)
[facility] Progress Note  Patient: Caleb Hicks MGQ:[id number]   PCP: Patient, No Pcp Per DOB: [birth year]   DOA: 11/07/2017   DOS: 11/11/2017   Date of Service: the patient was seen and examined on 11/11/2017  Subjective: Continues to feel better, no nausea no vomiting.  Diarrhea also getting better.  No abdominal pain.  Brief hospital course: Pt. with PMH of CKD stage 3,sp renal transplant, IgA nephropathy, HTN, HLD, AAA; admitted on 11/07/2017, presented with complaint of generalized weakness and abdominal discomfort with nausea and diarrhea, was found to have viral gastroenteritis with secondary adrenal insufficiency. Currently further plan is continue current management  Assessment and Plan: Sepsis due to Pseudomonas bacteremia. Mild sigmoid diverticulitis. Etiology of the bacteremia is still unclear.  Patient denies having a recent procedure. Primary complaint is abdominal with nausea vomiting and diarrhea. The abdomen pelvis is positive for mild sigmoid diverticulitis without any perforation. Patient is currently on IV Zosyn to cover intra-abdominal pathogens. Temporarily transition to oral Levaquin due to lack of IV access for now. Total 7 to 10 days treatment course recommended. When the patient is ready to be discharged from the hospital switch to [place]. GI pathogen panel is negative.  Acute kidney injury superimposed on CKD [facility]): Stage 4 Hyperkalemia Acute tubular necrosis from sepsis Presented with dehydration, continue IV hydration and follow kidney function obtain urine electrolytes FENa consistent with intrinsic renal etiology most likely acute tubular necrosis. Improvement in bicarb level with changing IV fluids. We will continue oral bicarb since the patient does not have any IV access at present. Encourage p.o. fluids. Discussed with nephrology feels that the patient is plateauing from ATN.  History of renal transplant will continue home medications    cyclosporine level trough 115, within normal range. Increase steroid from 5 mg to twice daily for stress dose. No adjustment of cyclosporine indicated at present  HTN (hypertension) Stable continue metoprolol but hold other blood pressure medications while dehydrated resume as able  Hyperlipidemia  stable continue medications  Iron deficiency anemia Chronic currently stable  Diet: Renal diet DVT Prophylaxis: subcutaneous Heparin  Advance goals of care discussion: DNR DNI  Family Communication: no family was present at bedside, at the time of interview.  Disposition:  Discharge to home.  Consultants: none Procedures: none  Antibiotics: Anti-infectives (From admission, onward)   Start     Dose/Rate Route Frequency Ordered Stop   11/11/17 1700  levofloxacin (LEVAQUIN) tablet 500 mg    Note to Pharmacy:  Lack of IV access.   500 mg Oral  Once 11/11/17 1625 11/11/17 1733   11/10/17 1200  piperacillin-tazobactam (ZOSYN) IVPB 2.25 g     2.25 g 100 mL/hr over 30 Minutes Intravenous Every 6 hours 11/10/17 1037     11/10/17 1000  metroNIDAZOLE (FLAGYL) IVPB 500 mg  Status:  Discontinued     500 mg 100 mL/hr over 60 Minutes Intravenous Every 8 hours 11/10/17 0923 11/10/17 0958   11/09/17 1700  vancomycin (VANCOCIN) IVPB 1000 mg/200 mL premix  Status:  Discontinued     1,000 mg 200 mL/hr over 60 Minutes Intravenous Every 48 hours 11/07/17 1943 11/08/17 1040   11/09/17 1200  piperacillin-tazobactam (ZOSYN) IVPB 2.25 g  Status:  Discontinued     2.25 g 100 mL/hr over 30 Minutes Intravenous Every 6 hours 11/09/17 1052 11/10/17 0923   11/08/17 2000  piperacillin-tazobactam (ZOSYN) IVPB 3.375 g  Status:  Discontinued     3.375 g 12.5 mL/hr over 240 Minutes Intravenous Every 8  hours 11/08/17 1826 11/09/17 1052   11/07/17 2300  piperacillin-tazobactam (ZOSYN) IVPB 3.375 g  Status:  Discontinued     3.375 g 12.5 mL/hr over 240 Minutes Intravenous Every 8 hours 11/07/17 1943 11/08/17  1040   11/07/17 1645  vancomycin (VANCOCIN) IVPB 1000 mg/200 mL premix  Status:  Discontinued     1,000 mg 200 mL/hr over 60 Minutes Intravenous Every 12 hours 11/07/17 1637 11/07/17 1639   11/07/17 1645  piperacillin-tazobactam (ZOSYN) IVPB 3.375 g     3.375 g 100 mL/hr over 30 Minutes Intravenous  Once 11/07/17 1637 11/07/17 1735   11/07/17 1645  vancomycin (VANCOCIN) IVPB 1000 mg/200 mL premix     1,000 mg 200 mL/hr over 60 Minutes Intravenous  Once 11/07/17 1639 11/07/17 1803       Objective: Physical Exam: Vitals:   11/11/17 0522 11/11/17 0659 11/11/17 1203 11/11/17 1320  BP: (!) 182/58 (!) 175/55 (!) 183/60 (!) 161/61  Pulse: (!) 37 (!) 40 (!) 43 (!) 47  Resp: 18  18 18   Temp: (!) 97.5 F (36.4 C)  97.6 F (36.4 C) 97.8 F (36.6 C)  TempSrc: Oral  Oral Oral  SpO2: 100% 97% 100% 99%  Weight:      Height:        Intake/Output Summary (Last 24 hours) at 11/11/2017 1851 Last data filed at 11/11/2017 1300 Gross per 24 hour  Intake 3075 ml  Output 2375 ml  Net 700 ml   Filed Weights   11/07/17 1623 11/11/17 0218  Weight: 74.8 kg (165 lb) 78.1 kg (172 lb 2.9 oz)   General: Alert, Awake and Oriented to Time, Place and Person. Appear in moderate distress, affect flat Eyes: PERRL, Conjunctiva normal ENT: Oral Mucosa clear moist. Neck: no JVD, no Abnormal Mass Or lumps Cardiovascular: S1 and S2 Present, no Murmur, Peripheral Pulses Present Respiratory: normal respiratory effort, Bilateral Air entry equal and Decreased, no use of accessory muscle, Clear to Auscultation, no Crackles, no wheezes Abdomen: Bowel Sound present, Soft and no tenderness, no hernia Skin: no redness, no Rash, no induration Extremities: no Pedal edema, no calf tenderness Neurologic: Grossly no focal neuro deficit. Bilaterally Equal motor strength  Data Reviewed: CBC: Recent Labs  Lab 11/07/17 1639 11/08/17 0430 11/09/17 0752 11/10/17 1200 11/11/17 0548  WBC 18.9* 13.8* 10.6* 7.2 4.9    NEUTROABS 18.2*  --  9.7* 5.9  --   HGB 11.6* 9.7* 9.7* 8.6* 8.3*  HCT 36.4* 30.7* 30.7* 26.5* 25.0*  MCV 94.5 95.3 94.8 91.4 92.9  PLT 242 197 177 174 678*   Basic Metabolic Panel: Recent Labs  Lab 11/07/17 1639 11/08/17 0430 11/08/17 0755 11/08/17 1614 11/09/17 0549 11/09/17 0752 11/10/17 0516 11/10/17 1200 11/11/17 0548  NA 141 137  --   --   --  140  --  138 137  K 4.4 5.5* 5.3* 4.3  --  4.4  --  4.5 4.0  CL 107 107  --   --   --  111  --  112* 106  CO2 20* 19*  --   --   --  18*  --  14* 21*  GLUCOSE 161* 149*  --   --   --  137*  --  168* 228*  BUN 78* 76*  --   --   --  76*  --  80* 81*  CREATININE 2.54* 2.86*  --   --  3.49* 3.57* 3.80* 3.99* 4.25*  CALCIUM 9.1 7.8*  --   --   --  7.9*  --  7.9* 7.9*  MG  --  1.2*  --  2.1  --  1.9  --   --  1.9  PHOS  --  4.2  --   --   --   --   --   --  5.2*    Liver Function Tests: Recent Labs  Lab 11/07/17 1639 11/08/17 0430 11/09/17 0752 11/10/17 1200 11/11/17 0548  AST 25 25 22 20   --   ALT 12* 13* 14* 11*  --   ALKPHOS 61 46 46 38  --   BILITOT 0.5 0.8 0.5 0.4  --   PROT 6.2* 5.0* 5.3* 4.8*  --   ALBUMIN 2.8* 2.3* 2.2* 2.1* 2.0*   Recent Labs  Lab 11/07/17 1639  LIPASE 33   No results for input(s): AMMONIA in the last 168 hours. Coagulation Profile: Recent Labs  Lab 11/07/17 2210  INR 1.08   Cardiac Enzymes: No results for input(s): CKTOTAL, CKMB, CKMBINDEX, TROPONINI in the last 168 hours. BNP (last 3 results) No results for input(s): PROBNP in the last 8760 hours. CBG: No results for input(s): GLUCAP in the last 168 hours. Studies: No results found.  Scheduled Meds: . cycloSPORINE modified  50 mg Oral QHS  . cycloSPORINE modified  75 mg Oral Daily  . heparin injection (subcutaneous)  5,000 Units Subcutaneous Q8H  . hydrALAZINE  50 mg Oral Q8H  . predniSONE  5 mg Oral BID WC  . sodium bicarbonate  1,300 mg Oral TID   Continuous Infusions: . piperacillin-tazobactam (ZOSYN)  IV Stopped  (11/11/17 0641)  .  sodium bicarbonate  infusion 1000 mL Stopped (11/11/17 1200)   PRN Meds: acetaminophen **OR** acetaminophen, ondansetron **OR** ondansetron (ZOFRAN) IV  Time spent: 48minutes  Author: Berle Mull, MD [facility] Pager: [phone] 11/11/2017 6:51 PM  If 7PM-7AM, please contact night-coverage at [email or web address], password [email or web address]

## 2017-11-11 NOTE — Progress Notes (Signed)
Patient stated that he has a non-productive cough. PCP on call was notified

## 2017-11-11 NOTE — Progress Notes (Signed)
PCP on call was notified regarding the following vital signs: 97.1F;HR 37;RR18;182/58;100% Room Air. Awaiting new orders

## 2017-11-12 LAB — CBC
HCT: 23.6 % — ABNORMAL LOW (ref 39.0–52.0)
Hemoglobin: 8 g/dL — ABNORMAL LOW (ref 13.0–17.0)
MCH: 30.7 pg (ref 26.0–34.0)
MCHC: 33.9 g/dL (ref 30.0–36.0)
MCV: 90.4 fL (ref 78.0–100.0)
PLATELETS: 161 10*3/uL (ref 150–400)
RBC: 2.61 MIL/uL — ABNORMAL LOW (ref 4.22–5.81)
RDW: 14.9 % (ref 11.5–15.5)
WBC: 5 10*3/uL (ref 4.0–10.5)

## 2017-11-12 LAB — RENAL FUNCTION PANEL
ALBUMIN: 1.9 g/dL — AB (ref 3.5–5.0)
Anion gap: 13 (ref 5–15)
BUN: 82 mg/dL — AB (ref 6–20)
CALCIUM: 7.4 mg/dL — AB (ref 8.9–10.3)
CO2: 22 mmol/L (ref 22–32)
CREATININE: 4.34 mg/dL — AB (ref 0.61–1.24)
Chloride: 100 mmol/L — ABNORMAL LOW (ref 101–111)
GFR calc Af Amer: 14 mL/min — ABNORMAL LOW (ref >60)
GFR, EST NON AFRICAN AMERICAN: 12 mL/min — AB (ref >60)
GLUCOSE: 172 mg/dL — AB (ref 65–99)
PHOSPHORUS: 4.6 mg/dL (ref 2.5–4.6)
Potassium: 3.2 mmol/L — ABNORMAL LOW (ref 3.5–5.1)
SODIUM: 135 mmol/L (ref 135–145)

## 2017-11-12 MED ORDER — HYDRALAZINE HCL 20 MG/ML IJ SOLN
10.0000 mg | INTRAMUSCULAR | Status: DC | PRN
  Administered 2017-11-12: 10 mg via INTRAVENOUS
  Filled 2017-11-12 (×3): qty 1

## 2017-11-12 MED ORDER — LEVOFLOXACIN 500 MG PO TABS
500.0000 mg | ORAL_TABLET | ORAL | Status: AC
  Administered 2017-11-13 – 2017-11-17 (×3): 500 mg via ORAL
  Filled 2017-11-12 (×4): qty 1

## 2017-11-12 MED ORDER — HYDRALAZINE HCL 50 MG PO TABS
75.0000 mg | ORAL_TABLET | Freq: Three times a day (TID) | ORAL | Status: DC
  Administered 2017-11-12 – 2017-11-17 (×15): 75 mg via ORAL
  Filled 2017-11-12 (×15): qty 1

## 2017-11-12 MED ORDER — PREDNISONE 5 MG PO TABS
5.0000 mg | ORAL_TABLET | Freq: Every day | ORAL | Status: DC
  Administered 2017-11-12 – 2017-11-24 (×13): 5 mg via ORAL
  Filled 2017-11-12 (×13): qty 1

## 2017-11-12 NOTE — Progress Notes (Signed)
Pt has lost IV access.  IV team notified as pt is a very difficult stick; he's had 5 PIV's this admission and CRNA had to insert one last night. Dr Posey Pronto aware unable to give bicarb IV and IV hydralazine at this time. Orders to d/c bicarb noted and new order for increased dose of PO hydralazine noted.  This Probation officer confirmed with Dr Posey Pronto that next dose of hydralazine not to be given until 2200 despite persistently elevated BP.

## 2017-11-12 NOTE — Care Management Note (Signed)
Case Management Note  Patient Details  Name: Caleb Hicks MRN: [id number] Date of Birth: Apr 10, 1941  Subjective/Objective:  [facility] following for HHPT,aide-await face to face order.                  Action/Plan:d/c home w/HHC.   Expected Discharge Date:  (unknown)               Expected Discharge Plan:  [place]  In-House Referral:     Discharge planning Services  CM Consult  Post Acute Care Choice:  Durable Medical Equipment(Has rw) Choice offered to:  Patient  DME Arranged:    DME Agency:     HH Arranged:  PT, Nurse's Aide [place] Agency:  [place]  Status of Service:  In process, will continue to follow  If discussed at Long Length of Stay Meetings, dates discussed:    Additional Comments:  Dessa Phi, RN 11/12/2017, 1:39 PM

## 2017-11-12 NOTE — Progress Notes (Signed)
Pharmacy Antibiotic Note  Caleb Hicks is a 77 y.o. male admitted on 11/07/2017 with pseudomonas bacteremia.  Pharmacy was consulted for zosyn dosing. Patient with CKD, s/p renal transplant, on Cyclosporine.  5/26 lost IV access, received Zosyn early am and late pm. Also ordered Levofloxacin 500mg  x1 (plan discharge on Levofloxacin po to avoid interaction with Cyclosporine). Trying to continue IV abx during admission. MD requests abx change to po Levaquin   Cyclosporine level drawn 5/23 (with SCr of 2.86) and was resulted 5/26 at 115 ng/ml Therapeutic range for Renal transplant is 100-250 ng/ml Anticipate that cyclosporine level likely higher than 115 with worsening clearance MD has discussed with renal, currently on Bicarb supplementation po (no IV access)  Today, 11/12/2017  SCr trending up  Change to po Levaquin  Plan:  Discontinue Zosyn   Levaquin 500mg  po q48hr for renal function  Height: 5\' 7"  (170.2 cm) Weight: 177 lb 7.5 oz (80.5 kg) IBW/kg (Calculated) : 66.1  Temp (24hrs), Avg:97.8 F (36.6 C), Min:97.6 F (36.4 C), Max:98 F (36.7 C)  Recent Labs  Lab 11/07/17 1904 11/07/17 2214 11/08/17 0132 11/08/17 0430 11/08/17 0755  11/09/17 0752 11/10/17 0516 11/10/17 1200 11/11/17 0548 11/12/17 0514  WBC  --   --   --  13.8*  --   --  10.6*  --  7.2 4.9 5.0  CREATININE  --   --   --  2.86*  --    < > 3.57* 3.80* 3.99* 4.25* 4.34*  LATICACIDVEN 2.9* 2.7* 2.3* 1.8 1.2  --   --   --   --   --   --    < > = values in this interval not displayed.    Estimated Creatinine Clearance: 14.5 mL/min (A) (by C-G formula based on SCr of 4.34 mg/dL (H)).    Allergies  Allergen Reactions  . Norvasc [Amlodipine Besylate]     Pt state it cause him weight gain  . Tape Other (See Comments)    Antimicrobials this admission:  5/22 vanc>> 5/23 5/22 zosyn>> 5/23 resumed >> 5/27 5/26 Levaquin x1, continued >>  Dose adjustments this admission:   Microbiology results:  5/22  BCx x2:  2 of 2 GNR> BCID with PsA. (S ceftaz, cipro, imip, zosyn, cefepime) 5/22 UCx: NG-final 5/23 GI panel: neg 5/24 BCx: ngtd  Thank you for allowing pharmacy to be a part of this patient's care.  Minda Ditto PharmD Pager [phone] 11/12/2017, 8:14 AM

## 2017-11-12 NOTE — Progress Notes (Signed)
[facility] Progress Note  Patient: Caleb Hicks GYI:[id number]   PCP: Patient, No Pcp Per DOB: [birth year]   DOA: 11/07/2017   DOS: 11/12/2017   Date of Service: the patient was seen and examined on 11/12/2017  Subjective: Has some abdominal discomfort.  No nausea vomiting.  Breathing is better.  Brief hospital course: Pt. with PMH of CKD stage 3,sp renal transplant, IgA nephropathy, HTN, HLD, AAA; admitted on 11/07/2017, presented with complaint of generalized weakness and abdominal discomfort with nausea and diarrhea, was found to have viral gastroenteritis with secondary adrenal insufficiency. Currently further plan is continue current management  Assessment and Plan: Sepsis due to Pseudomonas bacteremia. Mild sigmoid diverticulitis. Etiology of the bacteremia is still unclear.  Patient denies having a recent procedure. Primary complaint is abdominal with nausea vomiting and diarrhea. The abdomen pelvis is positive for mild sigmoid diverticulitis without any perforation. Patient was on IV Zosyn, will transition to oral Levaquin and monitor GI pathogen panel is negative.  Acute kidney injury superimposed on CKD [facility]): Stage 4 Hyperkalemia Acute tubular necrosis from sepsis Presented with dehydration, continue IV hydration and follow kidney function obtain urine electrolytes FENa consistent with intrinsic renal etiology most likely acute tubular necrosis. Improvement in bicarb level with changing IV fluids. Continue IV bicarb infusion. Encourage p.o. fluids. Discussed with nephrology feels that the patient is plateauing from ATN.  History of renal transplant will continue home medications  cyclosporine level trough 115, within normal range. Increase steroid from 5 mg to twice daily for stress dose. No adjustment of cyclosporine indicated at present  HTN (hypertension) Stable continue metoprolol but hold other blood pressure medications while dehydrated resume as  able  Hyperlipidemia  stable continue medications  Iron deficiency anemia Chronic currently stable  Diet: Renal diet DVT Prophylaxis: subcutaneous Heparin  Advance goals of care discussion: DNR DNI  Family Communication: no family was present at bedside, at the time of interview.  Disposition:  Discharge to home.  Consultants: none Procedures: none  Antibiotics: Anti-infectives (From admission, onward)   Start     Dose/Rate Route Frequency Ordered Stop   11/13/17 1000  levofloxacin (LEVAQUIN) tablet 500 mg     500 mg Oral Every 48 hours 11/12/17 0815     11/11/17 1700  levofloxacin (LEVAQUIN) tablet 500 mg    Note to Pharmacy:  Lack of IV access.   500 mg Oral  Once 11/11/17 1625 11/11/17 1733   11/10/17 1200  piperacillin-tazobactam (ZOSYN) IVPB 2.25 g  Status:  Discontinued     2.25 g 100 mL/hr over 30 Minutes Intravenous Every 6 hours 11/10/17 1037 11/12/17 0750   11/10/17 1000  metroNIDAZOLE (FLAGYL) IVPB 500 mg  Status:  Discontinued     500 mg 100 mL/hr over 60 Minutes Intravenous Every 8 hours 11/10/17 0923 11/10/17 0958   11/09/17 1700  vancomycin (VANCOCIN) IVPB 1000 mg/200 mL premix  Status:  Discontinued     1,000 mg 200 mL/hr over 60 Minutes Intravenous Every 48 hours 11/07/17 1943 11/08/17 1040   11/09/17 1200  piperacillin-tazobactam (ZOSYN) IVPB 2.25 g  Status:  Discontinued     2.25 g 100 mL/hr over 30 Minutes Intravenous Every 6 hours 11/09/17 1052 11/10/17 0923   11/08/17 2000  piperacillin-tazobactam (ZOSYN) IVPB 3.375 g  Status:  Discontinued     3.375 g 12.5 mL/hr over 240 Minutes Intravenous Every 8 hours 11/08/17 1826 11/09/17 1052   11/07/17 2300  piperacillin-tazobactam (ZOSYN) IVPB 3.375 g  Status:  Discontinued  3.375 g 12.5 mL/hr over 240 Minutes Intravenous Every 8 hours 11/07/17 1943 11/08/17 1040   11/07/17 1645  vancomycin (VANCOCIN) IVPB 1000 mg/200 mL premix  Status:  Discontinued     1,000 mg 200 mL/hr over 60 Minutes Intravenous  Every 12 hours 11/07/17 1637 11/07/17 1639   11/07/17 1645  piperacillin-tazobactam (ZOSYN) IVPB 3.375 g     3.375 g 100 mL/hr over 30 Minutes Intravenous  Once 11/07/17 1637 11/07/17 1735   11/07/17 1645  vancomycin (VANCOCIN) IVPB 1000 mg/200 mL premix     1,000 mg 200 mL/hr over 60 Minutes Intravenous  Once 11/07/17 1639 11/07/17 1803       Objective: Physical Exam: Vitals:   11/11/17 1320 11/11/17 2024 11/12/17 0354 11/12/17 1257  BP: (!) 161/61 (!) 160/69 (!) 158/63 (!) 185/65  Pulse: (!) 47 (!) 51 (!) 45 (!) 50  Resp: 18 18 18 18   Temp: 97.8 F (36.6 C) 98 F (36.7 C) 97.7 F (36.5 C) 98 F (36.7 C)  TempSrc: Oral Oral Oral Oral  SpO2: 99% 99% 96% 97%  Weight:   80.5 kg (177 lb 7.5 oz)   Height:        Intake/Output Summary (Last 24 hours) at 11/12/2017 1616 Last data filed at 11/12/2017 1400 Gross per 24 hour  Intake 2828.34 ml  Output 1700 ml  Net 1128.34 ml   Filed Weights   11/07/17 1623 11/11/17 0218 11/12/17 0354  Weight: 74.8 kg (165 lb) 78.1 kg (172 lb 2.9 oz) 80.5 kg (177 lb 7.5 oz)   General: Alert, Awake and Oriented to Time, Place and Person. Appear in moderate distress, affect flat Eyes: PERRL, Conjunctiva normal ENT: Oral Mucosa clear moist. Neck: no JVD, no Abnormal Mass Or lumps Cardiovascular: S1 and S2 Present, no Murmur, Peripheral Pulses Present Respiratory: normal respiratory effort, Bilateral Air entry equal and Decreased, no use of accessory muscle, Clear to Auscultation, no Crackles, no wheezes Abdomen: Bowel Sound present, Soft and no tenderness, no hernia Skin: no redness, no Rash, no induration Extremities: no Pedal edema, no calf tenderness Neurologic: Grossly no focal neuro deficit. Bilaterally Equal motor strength  Data Reviewed: CBC: Recent Labs  Lab 11/07/17 1639 11/08/17 0430 11/09/17 0752 11/10/17 1200 11/11/17 0548 11/12/17 0514  WBC 18.9* 13.8* 10.6* 7.2 4.9 5.0  NEUTROABS 18.2*  --  9.7* 5.9  --   --   HGB 11.6*  9.7* 9.7* 8.6* 8.3* 8.0*  HCT 36.4* 30.7* 30.7* 26.5* 25.0* 23.6*  MCV 94.5 95.3 94.8 91.4 92.9 90.4  PLT 242 197 177 174 140* 956   Basic Metabolic Panel: Recent Labs  Lab 11/08/17 0430  11/08/17 1614  11/09/17 0752 11/10/17 0516 11/10/17 1200 11/11/17 0548 11/12/17 0514  NA 137  --   --   --  140  --  138 137 135  K 5.5*   < > 4.3  --  4.4  --  4.5 4.0 3.2*  CL 107  --   --   --  111  --  112* 106 100*  CO2 19*  --   --   --  18*  --  14* 21* 22  GLUCOSE 149*  --   --   --  137*  --  168* 228* 172*  BUN 76*  --   --   --  76*  --  80* 81* 82*  CREATININE 2.86*  --   --    < > 3.57* 3.80* 3.99* 4.25* 4.34*  CALCIUM 7.8*  --   --   --  7.9*  --  7.9* 7.9* 7.4*  MG 1.2*  --  2.1  --  1.9  --   --  1.9  --   PHOS 4.2  --   --   --   --   --   --  5.2* 4.6   < > = values in this interval not displayed.    Liver Function Tests: Recent Labs  Lab 11/07/17 1639 11/08/17 0430 11/09/17 0752 11/10/17 1200 11/11/17 0548 11/12/17 0514  AST 25 25 22 20   --   --   ALT 12* 13* 14* 11*  --   --   ALKPHOS 61 46 46 38  --   --   BILITOT 0.5 0.8 0.5 0.4  --   --   PROT 6.2* 5.0* 5.3* 4.8*  --   --   ALBUMIN 2.8* 2.3* 2.2* 2.1* 2.0* 1.9*   Recent Labs  Lab 11/07/17 1639  LIPASE 33   No results for input(s): AMMONIA in the last 168 hours. Coagulation Profile: Recent Labs  Lab 11/07/17 2210  INR 1.08   Cardiac Enzymes: No results for input(s): CKTOTAL, CKMB, CKMBINDEX, TROPONINI in the last 168 hours. BNP (last 3 results) No results for input(s): PROBNP in the last 8760 hours. CBG: No results for input(s): GLUCAP in the last 168 hours. Studies: No results found.  Scheduled Meds: . cycloSPORINE modified  50 mg Oral QHS  . cycloSPORINE modified  75 mg Oral Daily  . heparin injection (subcutaneous)  5,000 Units Subcutaneous Q8H  . hydrALAZINE  50 mg Oral Q8H  . [START ON 11/13/2017] levofloxacin  500 mg Oral Q48H  . predniSONE  5 mg Oral Q breakfast   Continuous  Infusions: .  sodium bicarbonate  infusion 1000 mL 75 mL/hr at 11/12/17 1020   PRN Meds: acetaminophen **OR** acetaminophen, benzonatate, ondansetron **OR** ondansetron (ZOFRAN) IV  Time spent: 68minutes  Author: Berle Mull, MD [facility] Pager: [phone] 11/12/2017 4:16 PM  If 7PM-7AM, please contact night-coverage at [email or web address], password [facility]

## 2017-11-12 NOTE — Plan of Care (Signed)
  Problem: Health Behavior/Discharge Planning: Goal: Ability to manage health-related needs will improve Outcome: Progressing   Problem: Skin Integrity: Goal: Risk for impaired skin integrity will decrease Outcome: Progressing   Problem: Fluid Volume: Goal: Hemodynamic stability will improve Outcome: Progressing   Problem: Clinical Measurements: Goal: Diagnostic test results will improve Outcome: Progressing Note:  Creatinine remains elevated. Bicarb gtt @75ml /hr.  Goal: Signs and symptoms of infection will decrease Outcome: Progressing Note:  Abx changed from IV to PO.

## 2017-11-12 NOTE — Progress Notes (Signed)
Occupational Therapy Treatment Patient Details Name: Caleb Hicks MRN: [id number] DOB: [birth year] Today's Date: 11/12/2017    History of present illness Pt was admitted for dehydration/gastroenteritis, AKI on CKD.  He has a history of CKD, kidney transplant and HTN   OT comments  Pt with increased endurance this OT session  Follow Up Recommendations  HH with 24/7 A   Equipment Recommendations  None recommended by OT    Recommendations for Other Services      Precautions / Restrictions Precautions Precautions: Fall Restrictions Weight Bearing Restrictions: No       Mobility Bed Mobility               General bed mobility comments: oob in recliner  Transfers Overall transfer level: Needs assistance Equipment used: Rolling walker (2 wheeled) Transfers: Sit to/from Stand Sit to Stand: Supervision Stand pivot transfers: Supervision       General transfer comment: VCs hand placement, safe rollator operation    Balance                                           ADL either performed or assessed with clinical judgement   ADL Overall ADL's : Needs assistance/impaired     Grooming: Standing;Wash/dry hands;Oral care               Lower Body Dressing: Minimal assistance;Sit to/from stand   Toilet Transfer: Supervision/safety;Ambulation;Comfort height toilet;RW   Toileting- Clothing Manipulation and Hygiene: Supervision/safety;Sit to/from stand;Cueing for sequencing;Cueing for safety       Functional mobility during ADLs: Supervision/safety;Rolling walker General ADL Comments: pt with improved endurance this OT session     Vision Patient Visual Report: No change from baseline            Cognition Arousal/Alertness: Awake/alert Behavior During Therapy: WFL for tasks assessed/performed Overall Cognitive Status: Within Functional Limits for tasks assessed                                                      Pertinent Vitals/ Pain       Pain Assessment: No/denies pain  Home Living                                              Frequency  Min 2X/week        Progress Toward Goals  OT Goals(current goals can now be found in the care plan section)  Progress towards OT goals: Progressing toward goals     Plan Discharge plan needs to be updated    Co-evaluation                 AM-PAC PT "6 Clicks" Daily Activity     Outcome Measure   Help from another person eating meals?: None Help from another person taking care of personal grooming?: A Little Help from another person toileting, which includes using toliet, bedpan, or urinal?: A Little Help from another person bathing (including washing, rinsing, drying)?: A Little Help from another person to put on and taking off regular upper body clothing?: A Little Help from another person to put on  and taking off regular lower body clothing?: A Little 6 Click Score: 19    End of Session    OT Visit Diagnosis: Muscle weakness (generalized) (M62.81)   Activity Tolerance Patient tolerated treatment well   Patient Left in chair;with call bell/phone within reach;with family/visitor present   Nurse Communication Mobility status        Time: 1040-1110 OT Time Calculation (min): 30 min  Charges: OT General Charges $OT Visit: 1 Visit OT Treatments $Self Care/Home Management : 23-37 mins  [place], [place]   Betsy Pries 11/12/2017, 1:34 PM

## 2017-11-12 NOTE — Progress Notes (Signed)
Physical Therapy Treatment Patient Details Name: Dezmon Conover MRN: [id number] DOB: [birth year] Today's Date: 11/12/2017    History of Present Illness Pt was admitted for dehydration/gastroenteritis, AKI on CKD.  He has a history of CKD, kidney transplant and HTN    PT Comments    Pt continues to participate well with therapy. Recommend daily ambulation in hallway with nursing supervision.    Follow Up Recommendations  Home health PT     Equipment Recommendations  None recommended by PT    Recommendations for Other Services       Precautions / Restrictions Precautions Precautions: Fall Restrictions Weight Bearing Restrictions: No    Mobility  Bed Mobility               General bed mobility comments: oob in recliner  Transfers Overall transfer level: Needs assistance Equipment used: 4-wheeled walker Transfers: Sit to/from Stand Sit to Stand: Supervision         General transfer comment: VCs hand placement, safe rollator operation  Ambulation/Gait Ambulation/Gait assistance: Supervision Ambulation Distance (Feet): 375 Feet(375'x1, 25'x1) Assistive device: 4-wheeled walker Gait Pattern/deviations: Step-through pattern;Decreased stride length     General Gait Details: for safety. Pt tolerated distance well. No LOB with RW use. dyspnea 2/4. cues for pacing with rollator use. O2 100% on RA.    Stairs             Wheelchair Mobility    Modified Rankin (Stroke Patients Only)       Balance                                            Cognition Arousal/Alertness: Awake/alert Behavior During Therapy: WFL for tasks assessed/performed Overall Cognitive Status: Within Functional Limits for tasks assessed                                        Exercises      General Comments        Pertinent Vitals/Pain Pain Assessment: No/denies pain    Home Living                      Prior Function            PT Goals (current goals can now be found in the care plan section) Progress towards PT goals: Progressing toward goals    Frequency    Min 3X/week      PT Plan Current plan remains appropriate    Co-evaluation              AM-PAC PT "6 Clicks" Daily Activity  Outcome Measure  Difficulty turning over in bed (including adjusting bedclothes, sheets and blankets)?: A Little Difficulty moving from lying on back to sitting on the side of the bed? : A Little Difficulty sitting down on and standing up from a chair with arms (e.g., wheelchair, bedside commode, etc,.)?: A Little Help needed moving to and from a bed to chair (including a wheelchair)?: A Little Help needed walking in hospital room?: A Little Help needed climbing 3-5 steps with a railing? : A Little 6 Click Score: 18    End of Session   Activity Tolerance: Patient tolerated treatment well Patient left: in chair;with call bell/phone within reach   PT Visit Diagnosis: Unsteadiness  on feet (R26.81)     Time: 5997-7414 PT Time Calculation (min) (ACUTE ONLY): 15 min  Charges:  $Gait Training: 8-22 mins                    G Codes:          Weston Anna, MPT Pager: [phone]

## 2017-11-12 NOTE — Progress Notes (Signed)
Dr Posey Pronto made aware BP 187/67 even after scheduled dose of hydralazine given. Will monitor for further orders.

## 2017-11-13 LAB — CBC
HEMATOCRIT: 27.8 % — AB (ref 39.0–52.0)
HEMOGLOBIN: 9 g/dL — AB (ref 13.0–17.0)
MCH: 29.6 pg (ref 26.0–34.0)
MCHC: 32.4 g/dL (ref 30.0–36.0)
MCV: 91.4 fL (ref 78.0–100.0)
Platelets: 199 10*3/uL (ref 150–400)
RBC: 3.04 MIL/uL — AB (ref 4.22–5.81)
RDW: 14.7 % (ref 11.5–15.5)
WBC: 6.4 10*3/uL (ref 4.0–10.5)

## 2017-11-13 LAB — RENAL FUNCTION PANEL
ANION GAP: 15 (ref 5–15)
Albumin: 2.1 g/dL — ABNORMAL LOW (ref 3.5–5.0)
BUN: 86 mg/dL — ABNORMAL HIGH (ref 6–20)
CHLORIDE: 100 mmol/L — AB (ref 101–111)
CO2: 22 mmol/L (ref 22–32)
CREATININE: 4.31 mg/dL — AB (ref 0.61–1.24)
Calcium: 8.1 mg/dL — ABNORMAL LOW (ref 8.9–10.3)
GFR calc non Af Amer: 12 mL/min — ABNORMAL LOW (ref >60)
GFR, EST AFRICAN AMERICAN: 14 mL/min — AB (ref >60)
Glucose, Bld: 100 mg/dL — ABNORMAL HIGH (ref 65–99)
Phosphorus: 4.3 mg/dL (ref 2.5–4.6)
Potassium: 3 mmol/L — ABNORMAL LOW (ref 3.5–5.1)
Sodium: 137 mmol/L (ref 135–145)

## 2017-11-13 LAB — CULTURE, BLOOD (ROUTINE X 2): Special Requests: ADEQUATE

## 2017-11-13 MED ORDER — POTASSIUM CHLORIDE CRYS ER 20 MEQ PO TBCR
40.0000 meq | EXTENDED_RELEASE_TABLET | Freq: Once | ORAL | Status: AC
  Administered 2017-11-13: 40 meq via ORAL
  Filled 2017-11-13: qty 2

## 2017-11-13 NOTE — Plan of Care (Signed)
  Problem: Health Behavior/Discharge Planning: Goal: Ability to manage health-related needs will improve Outcome: Progressing   Problem: Skin Integrity: Goal: Risk for impaired skin integrity will decrease Outcome: Progressing   Problem: Fluid Volume: Goal: Hemodynamic stability will improve Outcome: Progressing   Problem: Clinical Measurements: Goal: Diagnostic test results will improve Outcome: Progressing Goal: Signs and symptoms of infection will decrease Outcome: Progressing

## 2017-11-13 NOTE — Progress Notes (Addendum)
[facility] Progress Note  Patient: Caleb Hicks FUX:[id number]   PCP: Patient, No Pcp Per DOB: [birth year]   DOA: 11/07/2017   DOS: 11/13/2017   Date of Service: the patient was seen and examined on 11/13/2017  Subjective: Pain is better.  Some nausea reported this morning.  No vomiting.  Still tolerating oral diet.  No diarrhea reported as well.  Brief hospital course: Pt. with PMH of CKD stage 3,sp renal transplant, IgA nephropathy, HTN, HLD, AAA; admitted on 11/07/2017, presented with complaint of generalized weakness and abdominal discomfort with nausea and diarrhea, was found to have viral gastroenteritis with secondary adrenal insufficiency.  CT scan of the abdomen showed evidence of diverticulitis.  Patient was started on IV antibiotics.  Blood cultures came back positive for Pseudomonas, repeat cultures are negative so far.  During the course patient's renal function started getting worse.  With his history of transplant, need for cyclosporine monitoring as well as being on anticoagulation patient requires close monitoring, given IV bicarb. Currently further plan is continue close monitoring of the renal function  Assessment and Plan: Sepsis due to Pseudomonas bacteremia. Mild sigmoid diverticulitis. Etiology of the bacteremia is still unclear.  Patient denies having a recent procedure. Primary complaint is abdominal with nausea vomiting and diarrhea. The abdomen pelvis is positive for mild sigmoid diverticulitis without any perforation. Patient was on IV Zosyn, will transition to oral Levaquin and monitor GI pathogen panel is negative.  Acute kidney injury superimposed on CKD [facility]): Stage 4 Hyperkalemia Acute tubular necrosis from sepsis Presented with dehydration, continue IV hydration and follow kidney function obtain urine electrolytes FENa consistent with intrinsic renal etiology most likely acute tubular necrosis. Improvement in bicarb level with changing IV  fluids. Encourage p.o. fluids. Discussed with nephrology feels that the patient is plateauing from ATN. Provide TED stockings.  Anticipate further improvement in renal function tomorrow and if that happens can safely be discharged home.  History of renal transplant will continue home medications  cyclosporine level trough 115, within normal range. Increase steroid from 5 mg to twice daily for stress dose.  Back on 5 mg daily dose. No adjustment of cyclosporine indicated at present  HTN (hypertension) Sinus bradycardia Stable Is on metoprolol at home, continues to remain bradycardic during the hospitalization. Recommend to discontinue beta-blocker on discharge and use peripheral vasodilators, tolerating hydralazine 75 mg twice daily dose for now.  Hyperlipidemia  stable continue medications  Iron deficiency anemia Chronic currently stable  Mild volume overload as well as lower extremity edema. Provided TED stockings. Avoiding diuresis on this patient as he is not symptomatic. Anticipating that the patient will correct himself rapidly once his ATN resolves.  Diet: Renal diet DVT Prophylaxis: subcutaneous Heparin  Advance goals of care discussion: DNR DNI  Family Communication: no family was present at bedside, at the time of interview.  Disposition:  Discharge to home.  Likely tomorrow  Consultants: none Procedures: none  Antibiotics: Anti-infectives (From admission, onward)   Start     Dose/Rate Route Frequency Ordered Stop   11/13/17 1000  levofloxacin (LEVAQUIN) tablet 500 mg     500 mg Oral Every 48 hours 11/12/17 0815     11/11/17 1700  levofloxacin (LEVAQUIN) tablet 500 mg    Note to Pharmacy:  Lack of IV access.   500 mg Oral  Once 11/11/17 1625 11/11/17 1733   11/10/17 1200  piperacillin-tazobactam (ZOSYN) IVPB 2.25 g  Status:  Discontinued     2.25 g 100 mL/hr over 30 Minutes  Intravenous Every 6 hours 11/10/17 1037 11/12/17 0750   11/10/17 1000   metroNIDAZOLE (FLAGYL) IVPB 500 mg  Status:  Discontinued     500 mg 100 mL/hr over 60 Minutes Intravenous Every 8 hours 11/10/17 0923 11/10/17 0958   11/09/17 1700  vancomycin (VANCOCIN) IVPB 1000 mg/200 mL premix  Status:  Discontinued     1,000 mg 200 mL/hr over 60 Minutes Intravenous Every 48 hours 11/07/17 1943 11/08/17 1040   11/09/17 1200  piperacillin-tazobactam (ZOSYN) IVPB 2.25 g  Status:  Discontinued     2.25 g 100 mL/hr over 30 Minutes Intravenous Every 6 hours 11/09/17 1052 11/10/17 0923   11/08/17 2000  piperacillin-tazobactam (ZOSYN) IVPB 3.375 g  Status:  Discontinued     3.375 g 12.5 mL/hr over 240 Minutes Intravenous Every 8 hours 11/08/17 1826 11/09/17 1052   11/07/17 2300  piperacillin-tazobactam (ZOSYN) IVPB 3.375 g  Status:  Discontinued     3.375 g 12.5 mL/hr over 240 Minutes Intravenous Every 8 hours 11/07/17 1943 11/08/17 1040   11/07/17 1645  vancomycin (VANCOCIN) IVPB 1000 mg/200 mL premix  Status:  Discontinued     1,000 mg 200 mL/hr over 60 Minutes Intravenous Every 12 hours 11/07/17 1637 11/07/17 1639   11/07/17 1645  piperacillin-tazobactam (ZOSYN) IVPB 3.375 g     3.375 g 100 mL/hr over 30 Minutes Intravenous  Once 11/07/17 1637 11/07/17 1735   11/07/17 1645  vancomycin (VANCOCIN) IVPB 1000 mg/200 mL premix     1,000 mg 200 mL/hr over 60 Minutes Intravenous  Once 11/07/17 1639 11/07/17 1803       Objective: Physical Exam: Vitals:   11/13/17 0426 11/13/17 0500 11/13/17 1220 11/13/17 1304  BP: (!) 159/76   (!) 169/78  Pulse: 82   70  Resp: 20   (!) 24  Temp: 98 F (36.7 C)   98.1 F (36.7 C)  TempSrc: Oral   Oral  SpO2: 95%  95% 98%  Weight:  81.9 kg (180 lb 8 oz)    Height:        Intake/Output Summary (Last 24 hours) at 11/13/2017 1749 Last data filed at 11/13/2017 1517 Gross per 24 hour  Intake 720 ml  Output 250 ml  Net 470 ml   Filed Weights   11/11/17 0218 11/12/17 0354 11/13/17 0500  Weight: 78.1 kg (172 lb 2.9 oz) 80.5 kg (177  lb 7.5 oz) 81.9 kg (180 lb 8 oz)   General: Alert, Awake and Oriented to Time, Place and Person. Appear in moderate distress, affect flat Eyes: PERRL, Conjunctiva normal ENT: Oral Mucosa clear moist. Neck: no JVD, no Abnormal Mass Or lumps Cardiovascular: S1 and S2 Present, no Murmur, Peripheral Pulses Present Respiratory: normal respiratory effort, Bilateral Air entry equal and Decreased, no use of accessory muscle, Clear to Auscultation, no Crackles, no wheezes Abdomen: Bowel Sound present, Soft and no tenderness, no hernia Skin: no redness, no Rash, no induration Extremities: bilateral Pedal edema, no calf tenderness Neurologic: Grossly no focal neuro deficit. Bilaterally Equal motor strength  Data Reviewed: CBC: Recent Labs  Lab 11/07/17 1639  11/09/17 0752 11/10/17 1200 11/11/17 0548 11/12/17 0514 11/13/17 0529  WBC 18.9*   < > 10.6* 7.2 4.9 5.0 6.4  NEUTROABS 18.2*  --  9.7* 5.9  --   --   --   HGB 11.6*   < > 9.7* 8.6* 8.3* 8.0* 9.0*  HCT 36.4*   < > 30.7* 26.5* 25.0* 23.6* 27.8*  MCV 94.5   < > 94.8  91.4 92.9 90.4 91.4  PLT 242   < > 177 174 140* 161 199   < > = values in this interval not displayed.   Basic Metabolic Panel: Recent Labs  Lab 11/08/17 0430  11/08/17 1614  11/09/17 0752 11/10/17 0516 11/10/17 1200 11/11/17 0548 11/12/17 0514 11/13/17 0529  NA 137  --   --   --  140  --  138 137 135 137  K 5.5*   < > 4.3  --  4.4  --  4.5 4.0 3.2* 3.0*  CL 107  --   --   --  111  --  112* 106 100* 100*  CO2 19*  --   --   --  18*  --  14* 21* 22 22  GLUCOSE 149*  --   --   --  137*  --  168* 228* 172* 100*  BUN 76*  --   --   --  76*  --  80* 81* 82* 86*  CREATININE 2.86*  --   --    < > 3.57* 3.80* 3.99* 4.25* 4.34* 4.31*  CALCIUM 7.8*  --   --   --  7.9*  --  7.9* 7.9* 7.4* 8.1*  MG 1.2*  --  2.1  --  1.9  --   --  1.9  --   --   PHOS 4.2  --   --   --   --   --   --  5.2* 4.6 4.3   < > = values in this interval not displayed.    Liver Function  Tests: Recent Labs  Lab 11/07/17 1639 11/08/17 0430 11/09/17 0752 11/10/17 1200 11/11/17 0548 11/12/17 0514 11/13/17 0529  AST 25 25 22 20   --   --   --   ALT 12* 13* 14* 11*  --   --   --   ALKPHOS 61 46 46 38  --   --   --   BILITOT 0.5 0.8 0.5 0.4  --   --   --   PROT 6.2* 5.0* 5.3* 4.8*  --   --   --   ALBUMIN 2.8* 2.3* 2.2* 2.1* 2.0* 1.9* 2.1*   Recent Labs  Lab 11/07/17 1639  LIPASE 33   No results for input(s): AMMONIA in the last 168 hours. Coagulation Profile: Recent Labs  Lab 11/07/17 2210  INR 1.08   Cardiac Enzymes: No results for input(s): CKTOTAL, CKMB, CKMBINDEX, TROPONINI in the last 168 hours. BNP (last 3 results) No results for input(s): PROBNP in the last 8760 hours. CBG: No results for input(s): GLUCAP in the last 168 hours. Studies: No results found.  Scheduled Meds: . cycloSPORINE modified  50 mg Oral QHS  . cycloSPORINE modified  75 mg Oral Daily  . heparin injection (subcutaneous)  5,000 Units Subcutaneous Q8H  . hydrALAZINE  75 mg Oral Q8H  . levofloxacin  500 mg Oral Q48H  . predniSONE  5 mg Oral Q breakfast   Continuous Infusions:  PRN Meds: acetaminophen **OR** acetaminophen, benzonatate, hydrALAZINE, ondansetron **OR** ondansetron (ZOFRAN) IV  Time spent: 98minutes  Author: Berle Mull, MD [facility] Pager: [phone] 11/13/2017 5:49 PM  If 7PM-7AM, please contact night-coverage at [email or web address], password [email or web address]

## 2017-11-14 LAB — CULTURE, BLOOD (ROUTINE X 2)
CULTURE: NO GROWTH
Culture: NO GROWTH
Special Requests: ADEQUATE

## 2017-11-14 LAB — RENAL FUNCTION PANEL
Albumin: 2.1 g/dL — ABNORMAL LOW (ref 3.5–5.0)
Anion gap: 11 (ref 5–15)
BUN: 79 mg/dL — ABNORMAL HIGH (ref 6–20)
CALCIUM: 8.4 mg/dL — AB (ref 8.9–10.3)
CO2: 22 mmol/L (ref 22–32)
CREATININE: 4.17 mg/dL — AB (ref 0.61–1.24)
Chloride: 101 mmol/L (ref 101–111)
GFR calc Af Amer: 15 mL/min — ABNORMAL LOW (ref >60)
GFR calc non Af Amer: 13 mL/min — ABNORMAL LOW (ref >60)
GLUCOSE: 96 mg/dL (ref 65–99)
Phosphorus: 4.2 mg/dL (ref 2.5–4.6)
Potassium: 3.3 mmol/L — ABNORMAL LOW (ref 3.5–5.1)
SODIUM: 134 mmol/L — AB (ref 135–145)

## 2017-11-14 LAB — NOROVIRUS GROUP 1 & 2 BY PCR, STOOL
NOROVIRUS 1 BY PCR: NEGATIVE
NOROVIRUS 2 BY PCR: NEGATIVE

## 2017-11-14 MED ORDER — METOPROLOL TARTRATE 25 MG PO TABS: 25.0000 mg | ORAL_TABLET | Freq: Two times a day (BID) | ORAL | Status: DC

## 2017-11-14 NOTE — Progress Notes (Signed)
Occupational Therapy Treatment Patient Details Name: Caleb Hicks MRN: [id number] DOB: [birth year] Today's Date: 11/14/2017    History of present illness Pt was admitted for dehydration/gastroenteritis, AKI on CKD.  He has a history of CKD, kidney transplant and HTN   OT comments  Pt plans d/c home today. Educated on energy conservation and safe shower transfer.  Pt/daughter have no further concerns.  Will sign off  Follow Up Recommendations  Supervision/Assistance - 24 hour    Equipment Recommendations  None recommended by OT    Recommendations for Other Services      Precautions / Restrictions Precautions Precautions: Fall Precaution Comments: diarrhea Restrictions Weight Bearing Restrictions: No       Mobility Bed Mobility                  Transfers                      Balance                                           ADL either performed or assessed with clinical judgement   ADL                                         General ADL Comments: pt did not want to get OOB this am.  Had bad night.  Pt is hoping to d/c home today.  His priority is taking a shower.  Explained that showering will take a lot of his energy. He does have a shower seat and daughter will make sure that her husband is present.  Educated on energy conservation (more luke warm water, long sponge vs family assisting) and demonstrated shower transfer using a RW for support.  Also educated on placement of shower seat. They don't anticipate any other difficulties at home     Vision       Perception     Praxis      Cognition Arousal/Alertness: Awake/alert Behavior During Therapy: WFL for tasks assessed/performed Overall Cognitive Status: Within Functional Limits for tasks assessed                                          Exercises     Shoulder Instructions       General Comments      Pertinent Vitals/ Pain        Pain Assessment: No/denies pain  Home Living                                          Prior Functioning/Environment              Frequency  Min 2X/week        Progress Toward Goals  OT Goals(current goals can now be found in the care plan section)  Progress towards OT goals: (pt plans d/c today; no further OT needs)     Plan      Co-evaluation                 AM-PAC PT "6 Clicks" Daily  Activity     Outcome Measure   Help from another person eating meals?: None Help from another person taking care of personal grooming?: A Little Help from another person toileting, which includes using toliet, bedpan, or urinal?: A Little Help from another person bathing (including washing, rinsing, drying)?: A Little Help from another person to put on and taking off regular upper body clothing?: A Little Help from another person to put on and taking off regular lower body clothing?: A Little 6 Click Score: 19    End of Session    OT Visit Diagnosis: Muscle weakness (generalized) (M62.81)   Activity Tolerance     Patient Left     Nurse Communication          Time: 4765-4650 OT Time Calculation (min): 10 min  Charges: OT General Charges $OT Visit: 1 Visit OT Treatments $Therapeutic Activity: 8-22 mins  Lesle Chris, OTR/L [phone] 11/14/2017   [place] 11/14/2017, 11:13 AM

## 2017-11-14 NOTE — Progress Notes (Signed)
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  CT scan of the abdomen showed evidence of diverticulitis.  Patient was subsequently found to have Pseudomonas bacteremia with possible ATN.  Patient admitted for further management.  Due to history of renal transplant, patient has been monitored closely and will be discharged home upon significant improvement of his renal function.  Today, patient reported feeling much better, reports poor sleep and some generalized weakness.  Denies any fever/chills, nausea/vomiting, abdominal pain, chest pain. Patient encouraged to ambulate.  Creatinine with very slight improvement from yesterday.   Assessment/Plan: Active Problems:   Acute kidney injury superimposed on CKD [facility]): Stage 4   Dehydration   HTN (hypertension)   Hyperlipidemia   Renal transplant, status post: 21 years ago: 1997   Iron deficiency anemia   Sepsis ([place])   Gastroenteritis   Sepsis due to Pseudomonas ([place])  Sepsis due to Pseudomonas bacteremia Currently afebrile, with no leukocytosis Etiology likely from ??  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 Likely due to sepsis FENa consistent with intrinsic renal etiology Patient was discussed with nephrology, feels like patient is plateauing from  ATN Monitor closely Plan to DC once significant improvement in renal function  History of renal transplant Continue cyclosporine, steroids Monitor closely  Hypertension Stable Continue hydralazine 75 mg every 8 hours Held home metoprolol due to persistent sinus bradycardia, plan to DC upon discharge  Iron deficiency anemia Stable   Code Status: DNR  Family Communication: Daughter at bedside  Disposition Plan: Home once significant improvement of renal function   Consultants:  None  Procedures:  None  Antimicrobials: Levaquin  DVT prophylaxis: Heparin   Objective: Vitals:   11/13/17 2025 11/14/17 0203 11/14/17 0608 11/14/17 1309  BP: (!) 176/75 (!) 159/73 (!) 153/79 (!) 154/74  Pulse: 84 89 85 89  Resp: 16  14 17   Temp: 99 F (37.2 C) 97.9 F (36.6 C) 97.8 F (36.6 C) 98 F (36.7 C)  TempSrc: Oral Oral Oral Oral  SpO2: 96%  92% 97%  Weight:  82.4 kg (181 lb 10.5 oz)    Height:        Intake/Output Summary (Last 24 hours) at 11/14/2017 1507 Last data filed at 11/14/2017 7628 Gross per 24 hour  Intake 560 ml  Output 850 ml  Net -290 ml   Filed Weights   11/12/17 0354 11/13/17 0500 11/14/17 0203  Weight: 80.5 kg (177 lb 7.5 oz) 81.9 kg (180 lb 8 oz) 82.4 kg (181 lb 10.5 oz)    Exam:   General: NAD  Cardiovascular: S1, S2 present  Respiratory: CTAB  Abdomen: Soft, nontender, nondistended, bowel sounds present  Musculoskeletal: Trace bilateral pedal edema  Skin: Normal  Psychiatry: Normal mood   Data Reviewed: CBC: Recent Labs  Lab 11/07/17 1639  11/09/17 0752 11/10/17 1200 11/11/17 0548 11/12/17 0514 11/13/17 0529  WBC 18.9*   < > 10.6* 7.2 4.9 5.0 6.4  NEUTROABS 18.2*  --  9.7* 5.9  --   --   --   HGB 11.6*   < > 9.7* 8.6* 8.3* 8.0* 9.0*  HCT 36.4*   < > 30.7* 26.5* 25.0* 23.6* 27.8*  MCV 94.5   < > 94.8 91.4 92.9 90.4 91.4  PLT 242   < > 177 174 140* 161 199   < > = values in this interval not displayed.   Basic  Metabolic Panel: Recent Labs  Lab 11/08/17 0430  11/08/17 1614  11/09/17 0752  11/10/17 1200 11/11/17 0548 11/12/17 0514 11/13/17 0529 11/14/17 0517  NA 137  --   --   --  140  --  138 137 135 137 134*  K 5.5*   < > 4.3  --  4.4  --  4.5 4.0 3.2* 3.0* 3.3*  CL 107  --   --   --  111  --  112* 106 100* 100* 101  CO2 19*  --   --   --  18*  --  14* 21* 22 22 22   GLUCOSE 149*  --   --   --  137*  --  168* 228* 172* 100* 96  BUN 76*  --   --   --  76*  --  80* 81* 82* 86* 79*  CREATININE 2.86*  --   --    < > 3.57*   < > 3.99* 4.25* 4.34* 4.31* 4.17*  CALCIUM 7.8*  --   --   --  7.9*  --  7.9* 7.9* 7.4* 8.1* 8.4*  MG 1.2*  --  2.1  --  1.9  --   --  1.9  --   --   --   PHOS 4.2  --   --   --   --   --   --  5.2* 4.6 4.3 4.2   < > = values in this interval not displayed.   GFR: Estimated Creatinine Clearance: 15.2 mL/min (A) (by C-G formula based on SCr of 4.17 mg/dL (H)). Liver Function Tests: Recent Labs  Lab 11/07/17 1639 11/08/17 0430 11/09/17 0752 11/10/17 1200 11/11/17 0548 11/12/17 0514 11/13/17 0529 11/14/17 0517  AST 25 25 22 20   --   --   --   --   ALT 12* 13* 14* 11*  --   --   --   --   ALKPHOS 61 46 46 38  --   --   --   --   BILITOT 0.5 0.8 0.5 0.4  --   --   --   --   PROT 6.2* 5.0* 5.3* 4.8*  --   --   --   --   ALBUMIN 2.8* 2.3* 2.2* 2.1* 2.0* 1.9* 2.1* 2.1*   Recent Labs  Lab 11/07/17 1639  LIPASE 33   No results for input(s): AMMONIA in the last 168 hours. Coagulation Profile: Recent Labs  Lab 11/07/17 2210  INR 1.08   Cardiac Enzymes: No results for input(s): CKTOTAL, CKMB, CKMBINDEX, TROPONINI in the last 168 hours. BNP (last 3 results) No results for input(s): PROBNP in the last 8760 hours. HbA1C: No results for input(s): HGBA1C in the last 72 hours. CBG: No results for input(s): GLUCAP in the last 168 hours. Lipid Profile: No results for input(s): CHOL, HDL, LDLCALC, TRIG, CHOLHDL, LDLDIRECT in the last 72 hours. Thyroid Function  Tests: No results for input(s): TSH, T4TOTAL, FREET4, T3FREE, THYROIDAB in the last 72 hours.  Anemia Panel: No results for input(s): VITAMINB12, FOLATE, FERRITIN, TIBC, IRON, RETICCTPCT in the last 72 hours. Urine analysis:    Component Value Date/Time   COLORURINE YELLOW 11/07/2017 [place] 11/07/2017 1715   LABSPEC 1.010 11/07/2017 1715   PHURINE 5.0 11/07/2017 1715   GLUCOSEU NEGATIVE 11/07/2017 1715   HGBUR MODERATE (A) 11/07/2017 [place] 11/07/2017 [place] 11/07/2017 1715   PROTEINUR 100 (A) 11/07/2017 1715   NITRITE NEGATIVE 11/07/2017 1715   LEUKOCYTESUR NEGATIVE 11/07/2017 1715   Sepsis Labs: @LABRCNTIP (procalcitonin:4,lacticidven:4)  ) Recent Results (from the past 240 hour(s))  Blood Culture (routine x 2)     Status: Abnormal   Collection Time: 11/07/17  4:40 PM  Result Value Ref Range Status   Specimen Description   Final    BLOOD RIGHT ANTECUBITAL Performed at [facility], [place] [address], [place], [place] [address]    Special Requests   Final    BOTTLES DRAWN AEROBIC AND ANAEROBIC Blood Culture adequate volume Performed at [place] [address] [place], [place] [address]    Culture  Setup Time   Final    GRAM NEGATIVE RODS ANAEROBIC BOTTLE ONLY CRITICAL RESULT CALLED TO, READ BACK BY AND VERIFIED WITH: A PHAM PHARMD 1807 11/08/17 A BROWNING    Culture (A)  Final    PSEUDOMONAS AERUGINOSA SUSCEPTIBILITIES PERFORMED ON PREVIOUS CULTURE WITHIN THE LAST 5 DAYS. Performed at [facility], [place] [address], [place], [place] [address]    Report Status 11/13/2017 FINAL  Final   Organism ID, Bacteria PSEUDOMONAS AERUGINOSA  Final      Susceptibility   Pseudomonas aeruginosa - MIC*    CEFTAZIDIME 4 SENSITIVE Sensitive     CIPROFLOXACIN <=0.25 SENSITIVE Sensitive     GENTAMICIN 8 INTERMEDIATE Intermediate     IMIPENEM 1 SENSITIVE Sensitive     PIP/TAZO <=4  SENSITIVE Sensitive     CEFEPIME 8 SENSITIVE Sensitive     * PSEUDOMONAS AERUGINOSA  Blood Culture ID Panel (Reflexed)     Status: Abnormal   Collection Time: 11/07/17  4:40 PM  Result Value Ref Range Status   Enterococcus species NOT DETECTED NOT DETECTED Final   Listeria monocytogenes NOT DETECTED NOT DETECTED Final   Staphylococcus species NOT DETECTED NOT DETECTED Final   Staphylococcus aureus NOT DETECTED NOT DETECTED Final   Streptococcus species NOT DETECTED NOT DETECTED Final   Streptococcus agalactiae NOT DETECTED NOT DETECTED Final   Streptococcus pneumoniae NOT DETECTED NOT DETECTED Final   Streptococcus pyogenes NOT DETECTED NOT DETECTED Final   Acinetobacter baumannii NOT DETECTED NOT DETECTED Final   Enterobacteriaceae species NOT DETECTED NOT DETECTED Final   Enterobacter cloacae complex NOT DETECTED NOT DETECTED Final   Escherichia coli NOT DETECTED NOT DETECTED Final   Klebsiella oxytoca NOT DETECTED NOT DETECTED Final   Klebsiella pneumoniae NOT DETECTED NOT DETECTED Final   Proteus species NOT DETECTED NOT DETECTED Final   Serratia marcescens NOT DETECTED NOT DETECTED Final   Carbapenem resistance NOT DETECTED NOT DETECTED Final   Haemophilus influenzae NOT DETECTED NOT DETECTED Final   Neisseria meningitidis NOT DETECTED NOT DETECTED Final   Pseudomonas aeruginosa DETECTED (A) NOT DETECTED Final    Comment: CRITICAL RESULT CALLED TO, READ BACK BY AND VERIFIED WITH: A PHAM PHARMD 1807 11/08/17 A BROWNING    Candida albicans NOT DETECTED NOT DETECTED Final   Candida glabrata NOT DETECTED NOT DETECTED Final   Candida krusei NOT  DETECTED NOT DETECTED Final   Candida parapsilosis NOT DETECTED NOT DETECTED Final   Candida tropicalis NOT DETECTED NOT DETECTED Final    Comment: Performed at [facility], [place] [address], [place], [place] [address]  Blood Culture (routine x 2)     Status: Abnormal   Collection Time: 11/07/17  4:53 PM  Result Value Ref Range Status     Specimen Description   Final    BLOOD RIGHT HAND Performed at [place] [address], [place], [place] [address]    Special Requests   Final    BOTTLES DRAWN AEROBIC ONLY Blood Culture results may not be optimal due to an inadequate volume of blood received in culture bottles Performed at [facility] [address] [place], [place] [address]    Culture  Setup Time   Final    GRAM NEGATIVE RODS AEROBIC BOTTLE ONLY CRITICAL VALUE NOTED.  VALUE IS CONSISTENT WITH PREVIOUSLY REPORTED AND CALLED VALUE.    Culture (A)  Final    PSEUDOMONAS AERUGINOSA SUSCEPTIBILITIES PERFORMED ON PREVIOUS CULTURE WITHIN THE LAST 5 DAYS. Performed at [facility], [place] [address] [place], [place] [address]    Report Status 11/11/2017 FINAL  Final  Urine culture     Status: None   Collection Time: 11/07/17  5:15 PM  Result Value Ref Range Status   Specimen Description   Final    URINE, RANDOM Performed at [place] [address] [place], [place] [address]    Special Requests   Final    NONE Performed at [facility], [place] [address], [place], [place] [address]    Culture   Final    NO GROWTH Performed at [facility], [place] [address] [place], [place] [address]    Report Status 11/09/2017 FINAL  Final  Gastrointestinal Panel by PCR , Stool     Status: None   Collection Time: 11/08/17  9:28 AM  Result Value Ref Range Status   Campylobacter species NOT DETECTED NOT DETECTED Final   Plesimonas shigelloides NOT DETECTED NOT DETECTED Final   Salmonella species NOT DETECTED NOT DETECTED Final   Yersinia enterocolitica NOT DETECTED NOT DETECTED Final   Vibrio species NOT DETECTED NOT DETECTED Final   Vibrio cholerae NOT DETECTED NOT DETECTED Final   Enteroaggregative E coli (EAEC) NOT DETECTED NOT DETECTED Final   Enteropathogenic E coli (EPEC) NOT DETECTED NOT DETECTED Final   Enterotoxigenic E coli  (ETEC) NOT DETECTED NOT DETECTED Final   Shiga like toxin producing E coli (STEC) NOT DETECTED NOT DETECTED Final   Shigella/Enteroinvasive E coli (EIEC) NOT DETECTED NOT DETECTED Final   Cryptosporidium NOT DETECTED NOT DETECTED Final   Cyclospora cayetanensis NOT DETECTED NOT DETECTED Final   Entamoeba histolytica NOT DETECTED NOT DETECTED Final   Giardia lamblia NOT DETECTED NOT DETECTED Final   Adenovirus F40/41 NOT DETECTED NOT DETECTED Final   Astrovirus NOT DETECTED NOT DETECTED Final   Norovirus GI/GII NOT DETECTED NOT DETECTED Final   Rotavirus A NOT DETECTED NOT DETECTED Final   Sapovirus (I, II, IV, and V) NOT DETECTED NOT DETECTED Final    Comment: Performed at [facility], [place] [place], [place] [address]  Culture, blood (routine x 2)     Status: None   Collection Time: 11/09/17  7:53 AM  Result Value Ref Range Status   Specimen Description   Final    BLOOD RIGHT ANTECUBITAL  Performed at [facility], [place] [address] [place], [place] [address]    Special Requests   Final    AEROBIC BOTTLE ONLY Blood Culture results may not be optimal due to an inadequate volume of blood received in culture bottles Performed at [place] [address], [place], [place] [address]    Culture   Final    NO GROWTH 5 DAYS Performed at [facility], [place] [address], [place], [place] [address]    Report Status 11/14/2017 FINAL  Final  Culture, blood (routine x 2)     Status: None   Collection Time: 11/09/17  7:53 AM  Result Value Ref Range Status   Specimen Description   Final    BLOOD RIGHT ANTECUBITAL Performed at [place] [address], [place], [place] [address]    Special Requests   Final    BOTTLES DRAWN AEROBIC AND ANAEROBIC Blood Culture adequate volume Performed at [place] [address], [place], [place] [address]    Culture   Final    NO GROWTH 5  DAYS Performed at [facility], [place] [address], [place], [place] [address]    Report Status 11/14/2017 FINAL  Final      Studies: No results found.  Scheduled Meds: . cycloSPORINE modified  50 mg Oral QHS  . cycloSPORINE modified  75 mg Oral Daily  . heparin injection (subcutaneous)  5,000 Units Subcutaneous Q8H  . hydrALAZINE  75 mg Oral Q8H  . levofloxacin  500 mg Oral Q48H  . predniSONE  5 mg Oral Q breakfast    Continuous Infusions:   LOS: 5 days     Alma [place], MD [facility]   If 7PM-7AM, please contact night-coverage [email or web address] Password [facility] 11/14/2017, 3:07 PM

## 2017-11-14 NOTE — Care Management Important Message (Signed)
Important Message  Patient Details  Name: Aymen Widrig MRN: [id number] Date of Birth: 10/10/1940   Medicare Important Message Given:  Yes    Kerin Salen 11/14/2017, 12:23 [place] Message  Patient Details  Name: Yaziel Brandon MRN: [id number] Date of Birth: 03-15-1941   Medicare Important Message Given:  Yes    Kerin Salen 11/14/2017, 12:23 PM

## 2017-11-14 NOTE — Progress Notes (Signed)
Physical Therapy Treatment Patient Details Name: Caleb Hicks MRN: [id number] DOB: [birth year] Today's Date: 11/14/2017    History of Present Illness Pt was admitted for dehydration/gastroenteritis, AKI on CKD.  He has a history of CKD, kidney transplant and HTN    PT Comments    Pt continues to participate well.   Follow Up Recommendations  Home health PT     Equipment Recommendations  None recommended by PT    Recommendations for Other Services       Precautions / Restrictions Precautions Precautions: Fall Precaution Comments: diarrhea Restrictions Weight Bearing Restrictions: No    Mobility  Bed Mobility Overal bed mobility: Needs Assistance Bed Mobility: Supine to Sit;Sit to Supine     Supine to sit: Modified independent (Device/Increase time);HOB elevated Sit to supine: Modified independent (Device/Increase time);HOB elevated      Transfers Overall transfer level: Needs assistance Equipment used: Rolling walker (2 wheeled) Transfers: Sit to/from Stand Sit to Stand: Supervision         General transfer comment: VCs hand placement  Ambulation/Gait Ambulation/Gait assistance: Supervision Ambulation Distance (Feet): 300 Feet Assistive device: Rolling walker (2 wheeled) Gait Pattern/deviations: Step-through pattern     General Gait Details: for safety. 2 brief standing rest breaks. Dyspnea 2/4.    Stairs             Wheelchair Mobility    Modified Rankin (Stroke Patients Only)       Balance                                            Cognition Arousal/Alertness: Awake/alert Behavior During Therapy: WFL for tasks assessed/performed Overall Cognitive Status: Within Functional Limits for tasks assessed                                        Exercises      General Comments        Pertinent Vitals/Pain Pain Assessment: No/denies pain    Home Living                      Prior  Function            PT Goals (current goals can now be found in the care plan section) Progress towards PT goals: Progressing toward goals    Frequency    Min 3X/week      PT Plan Current plan remains appropriate    Co-evaluation              AM-PAC PT "6 Clicks" Daily Activity  Outcome Measure  Difficulty turning over in bed (including adjusting bedclothes, sheets and blankets)?: A Little Difficulty moving from lying on back to sitting on the side of the bed? : A Little Difficulty sitting down on and standing up from a chair with arms (e.g., wheelchair, bedside commode, etc,.)?: A Little Help needed moving to and from a bed to chair (including a wheelchair)?: A Little Help needed walking in hospital room?: A Little Help needed climbing 3-5 steps with a railing? : A Little 6 Click Score: 18    End of Session   Activity Tolerance: Patient tolerated treatment well Patient left: in bed;with call bell/phone within reach;with bed alarm set         Time: 1435-1445 PT  Time Calculation (min) (ACUTE ONLY): 10 min  Charges:  $Gait Training: 8-22 mins                    G Codes:          [place], MPT Pager: [phone]

## 2017-11-14 NOTE — Plan of Care (Signed)
CKD stg 4 diet education provided and teach-back method used.   Provided pt with "Phosphorus Content of Foods", "Potassium Content of Foods", and "Fluid Restricted Nutrition Therapy" handouts from the Academy of Nutrition and Dietetics.   Discussed with pt the importance of phosphorus and potassium as well as why it should be limited with CKD stg 4. Encouraged pt to eat foods lower in potassium and phosphorus and to limit foods that are high in potassium and phosphorus; told pt that a good rule of thumb is to not go over the RDI's for those nutrients. Answered pt questions about the phosphorus & potassium content of specific food items such as milk. Suggested to pt that he could leach the potassium from potatoes and gave instruction on how to do so.   Spoke with pt about protein; while it doesn't need to be restricted, he should avoid protein shakes. Pt asked how much protein he should be having; told pt a good rule of thumb is about 0.8 grams of protein/kg body weight, gave him a range of about 65-75 grams of protein based off his current kg.   Told pt how much fluid he is allowed to have with his fluid restriction (2 L, 67 oz, or 8 cups) and provided it on his fluid restriction handout. Discussed with pt to be mindful of fluid in foods such as soup, ice cream, and watermelon. Additionally, gave tips on how to reduce thirst such as sucking on hard candy to stimulate saliva.   Expect good compliance.   Body mass index is 28.45 kg/m. Patient meets criteria for overweight based on current BMI.   Current diet order is renal with 2 L fluid restriction, patient is consuming approximately 100% of meals at this time. Labs and medications reviewed.   No nutrition interventions warranted at this time. If nutrition issues arise, please consult RD.   Hope Budds, Dietetic Intern

## 2017-11-14 NOTE — Progress Notes (Signed)
Provided w/pcp listing [facility], informed as another option to contact his health insurance customer service line for pcp in network-patient voiced understanding.

## 2017-11-15 LAB — RENAL FUNCTION PANEL
Albumin: 2.1 g/dL — ABNORMAL LOW (ref 3.5–5.0)
Anion gap: 10 (ref 5–15)
BUN: 78 mg/dL — AB (ref 6–20)
CHLORIDE: 102 mmol/L (ref 101–111)
CO2: 23 mmol/L (ref 22–32)
CREATININE: 4.4 mg/dL — AB (ref 0.61–1.24)
Calcium: 8.2 mg/dL — ABNORMAL LOW (ref 8.9–10.3)
GFR calc Af Amer: 14 mL/min — ABNORMAL LOW (ref >60)
GFR calc non Af Amer: 12 mL/min — ABNORMAL LOW (ref >60)
GLUCOSE: 98 mg/dL (ref 65–99)
POTASSIUM: 3.6 mmol/L (ref 3.5–5.1)
Phosphorus: 4.9 mg/dL — ABNORMAL HIGH (ref 2.5–4.6)
Sodium: 135 mmol/L (ref 135–145)

## 2017-11-15 MED ORDER — HYDRALAZINE HCL 20 MG/ML IJ SOLN: 10.0000 mg | Freq: Four times a day (QID) | INTRAMUSCULAR | Status: DC | PRN

## 2017-11-15 NOTE — Progress Notes (Signed)
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  CT scan of the abdomen showed evidence of diverticulitis.  Caleb Hicks was subsequently found to have Pseudomonas bacteremia with possible ATN.  Caleb Hicks admitted for further management.  Due to history of renal transplant, Caleb Hicks has been monitored closely and will be discharged home upon significant improvement of his renal function.  Today, Caleb Hicks denies any fever/chills, nausea/vomiting, abdominal pain, chest pain. Creatinine worsened, nephrology consulted   Assessment/Plan: Active Problems:   Acute kidney injury superimposed on CKD [facility]): Stage 4   Dehydration   HTN (hypertension)   Hyperlipidemia   Renal transplant, status post: 21 years ago: 1997   Iron deficiency anemia   Sepsis ([place])   Gastroenteritis   Sepsis due to Pseudomonas ([place])  Sepsis due to Pseudomonas bacteremia Currently afebrile, with no leukocytosis Etiology likely from ??  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 Likely due to sepsis FENa consistent with intrinsic renal etiology Nephrology consulted, appreciate recs, will se in am Plan to DC once significant improvement in renal function  History of renal transplant Continue cyclosporine, steroids Nephrology consulted Monitor  closely  Hypertension Continue hydralazine 75 mg every 8 hours Held home metoprolol due to persistent sinus bradycardia, plan to DC upon discharge  Iron deficiency anemia Stable   Code Status: DNR  Family Communication: Daughter at bedside  Disposition Plan: Home once significant improvement of renal function   Consultants:  None  Procedures:  None  Antimicrobials: Levaquin  DVT prophylaxis: Heparin   Objective: Vitals:   11/14/17 1500 11/14/17 2054 11/15/17 0533 11/15/17 1327  BP: (!) 153/74 (!) 169/72 (!) 157/73 (!) 155/74  Pulse: 87 86 81 96  Resp:  14 16 17   Temp:  97.6 F (36.4 C) 98.7 F (37.1 C) 97.9 F (36.6 C)  TempSrc:  Oral Oral Oral  SpO2:  94% 93% 98%  Weight:   81.9 kg (180 lb 8.9 oz)   Height:        Intake/Output Summary (Last 24 hours) at 11/15/2017 1921 Last data filed at 11/15/2017 1856 Gross per 24 hour  Intake 360 ml  Output 1075 ml  Net -715 ml   Filed Weights   11/13/17 0500 11/14/17 0203 11/15/17 0533  Weight: 81.9 kg (180 lb 8 oz) 82.4 kg (181 lb 10.5 oz) 81.9 kg (180 lb 8.9 oz)    Exam:   General: NAD  Cardiovascular: S1, S2 present  Respiratory: CTAB  Abdomen: Soft, nontender, nondistended, bowel sounds present  Musculoskeletal: Trace bilateral pedal edema  Skin: Normal  Psychiatry: Normal mood   Data Reviewed: CBC: Recent Labs  Lab 11/09/17 0752 11/10/17 1200 11/11/17 0548 11/12/17 0514 11/13/17 0529  WBC 10.6* 7.2 4.9 5.0 6.4  NEUTROABS 9.7* 5.9  --   --   --   HGB 9.7* 8.6* 8.3* 8.0* 9.0*  HCT 30.7* 26.5* 25.0* 23.6* 27.8*  MCV 94.8 91.4 92.9 90.4 91.4  PLT  177 174 140* 161 081   Basic Metabolic Panel: Recent Labs  Lab 11/09/17 0752  11/11/17 0548 11/12/17 0514 11/13/17 0529 11/14/17 0517 11/15/17 0536  NA 140   < > 137 135 137 134* 135  K 4.4   < > 4.0 3.2* 3.0* 3.3* 3.6  CL 111   < > 106 100* 100* 101 102  CO2 18*   < > 21* 22 22 22 23   GLUCOSE 137*   < > 228* 172* 100* 96 98  BUN  76*   < > 81* 82* 86* 79* 78*  CREATININE 3.57*   < > 4.25* 4.34* 4.31* 4.17* 4.40*  CALCIUM 7.9*   < > 7.9* 7.4* 8.1* 8.4* 8.2*  MG 1.9  --  1.9  --   --   --   --   PHOS  --   --  5.2* 4.6 4.3 4.2 4.9*   < > = values in this interval not displayed.   GFR: Estimated Creatinine Clearance: 14.4 mL/min (A) (by C-G formula based on SCr of 4.4 mg/dL (H)). Liver Function Tests: Recent Labs  Lab 11/09/17 0752 11/10/17 1200 11/11/17 0548 11/12/17 0514 11/13/17 0529 11/14/17 0517 11/15/17 0536  AST 22 20  --   --   --   --   --   ALT 14* 11*  --   --   --   --   --   ALKPHOS 46 38  --   --   --   --   --   BILITOT 0.5 0.4  --   --   --   --   --   PROT 5.3* 4.8*  --   --   --   --   --   ALBUMIN 2.2* 2.1* 2.0* 1.9* 2.1* 2.1* 2.1*   No results for input(s): LIPASE, AMYLASE in the last 168 hours. No results for input(s): AMMONIA in the last 168 hours. Coagulation Profile: No results for input(s): INR, PROTIME in the last 168 hours. Cardiac Enzymes: No results for input(s): CKTOTAL, CKMB, CKMBINDEX, TROPONINI in the last 168 hours. BNP (last 3 results) No results for input(s): PROBNP in the last 8760 hours. HbA1C: No results for input(s): HGBA1C in the last 72 hours. CBG: No results for input(s): GLUCAP in the last 168 hours. Lipid Profile: No results for input(s): CHOL, HDL, LDLCALC, TRIG, CHOLHDL, LDLDIRECT in the last 72 hours. Thyroid Function Tests: No results for input(s): TSH, T4TOTAL, FREET4, T3FREE, THYROIDAB in the last 72 hours. Anemia Panel: No results for input(s): VITAMINB12, FOLATE, FERRITIN, TIBC, IRON, RETICCTPCT in the last 72 hours. Urine analysis:    Component Value Date/Time   COLORURINE YELLOW 11/07/2017 [place] 11/07/2017 1715   LABSPEC 1.010 11/07/2017 1715   PHURINE 5.0 11/07/2017 1715   GLUCOSEU NEGATIVE 11/07/2017 1715   HGBUR MODERATE (A) 11/07/2017 [place] 11/07/2017 [place] 11/07/2017 1715    PROTEINUR 100 (A) 11/07/2017 1715   NITRITE NEGATIVE 11/07/2017 1715   LEUKOCYTESUR NEGATIVE 11/07/2017 1715   Sepsis Labs: @LABRCNTIP (procalcitonin:4,lacticidven:4)  ) Recent Results (from the past 240 hour(s))  Blood Culture (routine x 2)     Status: Abnormal   Collection Time: 11/07/17  4:40 PM  Result Value Ref Range Status   Specimen Description   Final    BLOOD RIGHT ANTECUBITAL Performed at [facility], [place] [address] [place], [place] [address]    Special Requests   Final  BOTTLES DRAWN AEROBIC AND ANAEROBIC Blood Culture adequate volume Performed at [place] [address], [place], [place] [address]    Culture  Setup Time   Final    GRAM NEGATIVE RODS ANAEROBIC BOTTLE ONLY CRITICAL RESULT CALLED TO, READ BACK BY AND VERIFIED WITH: A PHAM PHARMD 1807 11/08/17 A BROWNING    Culture (A)  Final    PSEUDOMONAS AERUGINOSA SUSCEPTIBILITIES PERFORMED ON PREVIOUS CULTURE WITHIN THE LAST 5 DAYS. Performed at [facility], [place] [address], [place], [place] [address]    Report Status 11/13/2017 FINAL  Final   Organism ID, Bacteria PSEUDOMONAS AERUGINOSA  Final      Susceptibility   Pseudomonas aeruginosa - MIC*    CEFTAZIDIME 4 SENSITIVE Sensitive     CIPROFLOXACIN <=0.25 SENSITIVE Sensitive     GENTAMICIN 8 INTERMEDIATE Intermediate     IMIPENEM 1 SENSITIVE Sensitive     PIP/TAZO <=4 SENSITIVE Sensitive     CEFEPIME 8 SENSITIVE Sensitive     * PSEUDOMONAS AERUGINOSA  Blood Culture ID Panel (Reflexed)     Status: Abnormal   Collection Time: 11/07/17  4:40 PM  Result Value Ref Range Status   Enterococcus species NOT DETECTED NOT DETECTED Final   Listeria monocytogenes NOT DETECTED NOT DETECTED Final   Staphylococcus species NOT DETECTED NOT DETECTED Final   Staphylococcus aureus NOT DETECTED NOT DETECTED Final   Streptococcus species NOT DETECTED NOT DETECTED Final   Streptococcus agalactiae NOT DETECTED NOT DETECTED  Final   Streptococcus pneumoniae NOT DETECTED NOT DETECTED Final   Streptococcus pyogenes NOT DETECTED NOT DETECTED Final   Acinetobacter baumannii NOT DETECTED NOT DETECTED Final   Enterobacteriaceae species NOT DETECTED NOT DETECTED Final   Enterobacter cloacae complex NOT DETECTED NOT DETECTED Final   Escherichia coli NOT DETECTED NOT DETECTED Final   Klebsiella oxytoca NOT DETECTED NOT DETECTED Final   Klebsiella pneumoniae NOT DETECTED NOT DETECTED Final   Proteus species NOT DETECTED NOT DETECTED Final   Serratia marcescens NOT DETECTED NOT DETECTED Final   Carbapenem resistance NOT DETECTED NOT DETECTED Final   Haemophilus influenzae NOT DETECTED NOT DETECTED Final   Neisseria meningitidis NOT DETECTED NOT DETECTED Final   Pseudomonas aeruginosa DETECTED (A) NOT DETECTED Final    Comment: CRITICAL RESULT CALLED TO, READ BACK BY AND VERIFIED WITH: A PHAM PHARMD 1807 11/08/17 A BROWNING    Candida albicans NOT DETECTED NOT DETECTED Final   Candida glabrata NOT DETECTED NOT DETECTED Final   Candida krusei NOT DETECTED NOT DETECTED Final   Candida parapsilosis NOT DETECTED NOT DETECTED Final   Candida tropicalis NOT DETECTED NOT DETECTED Final    Comment: Performed at [facility], [place] [address] [place], [place] [address]  Blood Culture (routine x 2)     Status: Abnormal   Collection Time: 11/07/17  4:53 PM  Result Value Ref Range Status   Specimen Description   Final    BLOOD RIGHT HAND Performed at [place] [address], [place], [place] [address]    Special Requests   Final    BOTTLES DRAWN AEROBIC ONLY Blood Culture results may not be optimal due to an inadequate volume of blood received in culture bottles Performed at [place] [address], [place], [place] [address]    Culture  Setup Time   Final    GRAM NEGATIVE RODS AEROBIC BOTTLE ONLY CRITICAL VALUE NOTED.  VALUE IS CONSISTENT WITH PREVIOUSLY REPORTED AND  CALLED VALUE.  Culture (A)  Final    PSEUDOMONAS AERUGINOSA SUSCEPTIBILITIES PERFORMED ON PREVIOUS CULTURE WITHIN THE LAST 5 DAYS. Performed at [facility], [place] [address] [place], [place] [address]    Report Status 11/11/2017 FINAL  Final  Urine culture     Status: None   Collection Time: 11/07/17  5:15 PM  Result Value Ref Range Status   Specimen Description   Final    URINE, RANDOM Performed at [place] [address], [place], [place] [address]    Special Requests   Final    NONE Performed at [facility], [place] [address] [place], [place] [address]    Culture   Final    NO GROWTH Performed at [facility], [place] [address] [place], [place] [address]    Report Status 11/09/2017 FINAL  Final  Gastrointestinal Panel by PCR , Stool     Status: None   Collection Time: 11/08/17  9:28 AM  Result Value Ref Range Status   Campylobacter species NOT DETECTED NOT DETECTED Final   Plesimonas shigelloides NOT DETECTED NOT DETECTED Final   Salmonella species NOT DETECTED NOT DETECTED Final   Yersinia enterocolitica NOT DETECTED NOT DETECTED Final   Vibrio species NOT DETECTED NOT DETECTED Final   Vibrio cholerae NOT DETECTED NOT DETECTED Final   Enteroaggregative E coli (EAEC) NOT DETECTED NOT DETECTED Final   Enteropathogenic E coli (EPEC) NOT DETECTED NOT DETECTED Final   Enterotoxigenic E coli (ETEC) NOT DETECTED NOT DETECTED Final   Shiga like toxin producing E coli (STEC) NOT DETECTED NOT DETECTED Final   Shigella/Enteroinvasive E coli (EIEC) NOT DETECTED NOT DETECTED Final   Cryptosporidium NOT DETECTED NOT DETECTED Final   Cyclospora cayetanensis NOT DETECTED NOT DETECTED Final   Entamoeba histolytica NOT DETECTED NOT DETECTED Final   Giardia lamblia NOT DETECTED NOT DETECTED Final   Adenovirus F40/41 NOT DETECTED NOT DETECTED Final   Astrovirus NOT DETECTED NOT DETECTED Final   Norovirus GI/GII NOT DETECTED NOT  DETECTED Final   Rotavirus A NOT DETECTED NOT DETECTED Final   Sapovirus (I, II, IV, and V) NOT DETECTED NOT DETECTED Final    Comment: Performed at [facility], [place]., [place], [place] [address]  Culture, blood (routine x 2)     Status: None   Collection Time: 11/09/17  7:53 AM  Result Value Ref Range Status   Specimen Description   Final    BLOOD RIGHT ANTECUBITAL Performed at [facility], [place] [address], [place], [place] [address]    Special Requests   Final    AEROBIC BOTTLE ONLY Blood Culture results may not be optimal due to an inadequate volume of blood received in culture bottles Performed at [place] [address] [place], [place] [address]    Culture   Final    NO GROWTH 5 DAYS Performed at [facility], [place] [address] [place], [place] [address]    Report Status 11/14/2017 FINAL  Final  Culture, blood (routine x 2)     Status: None   Collection Time: 11/09/17  7:53 AM  Result Value Ref Range Status   Specimen Description   Final    BLOOD RIGHT ANTECUBITAL Performed at [place] [address] [place], [place] [address]    Special Requests   Final    BOTTLES DRAWN AEROBIC AND ANAEROBIC Blood Culture adequate volume Performed at [place]  [address] [place], [place] [address]    Culture   Final    NO GROWTH 5 DAYS Performed at [facility], [place] [address], [place], [place] [address]    Report Status 11/14/2017 FINAL  Final      Studies: No results found.  Scheduled Meds: . cycloSPORINE modified  50 mg Oral QHS  . cycloSPORINE modified  75 mg Oral Daily  . heparin injection (subcutaneous)  5,000 Units Subcutaneous Q8H  . hydrALAZINE  75 mg Oral Q8H  . levofloxacin  500 mg Oral Q48H  . predniSONE  5 mg Oral Q breakfast    Continuous Infusions:   LOS: 6 days     Alma Friendly, MD [facility]   If 7PM-7AM,  please contact night-coverage [email or web address] Password [email or web address] 11/15/2017, 7:21 PM

## 2017-11-16 ENCOUNTER — Inpatient Hospital Stay (HOSPITAL_COMMUNITY): Payer: Medicare Other

## 2017-11-16 LAB — CBC WITH DIFFERENTIAL/PLATELET
Basophils Absolute: 0 10*3/uL (ref 0.0–0.1)
Basophils Relative: 0 %
Eosinophils Absolute: 0.1 10*3/uL (ref 0.0–0.7)
Eosinophils Relative: 1 %
HEMATOCRIT: 26.2 % — AB (ref 39.0–52.0)
HEMOGLOBIN: 8.4 g/dL — AB (ref 13.0–17.0)
LYMPHS ABS: 1.1 10*3/uL (ref 0.7–4.0)
Lymphocytes Relative: 18 %
MCH: 30.3 pg (ref 26.0–34.0)
MCHC: 32.1 g/dL (ref 30.0–36.0)
MCV: 94.6 fL (ref 78.0–100.0)
MONOS PCT: 5 %
Monocytes Absolute: 0.3 10*3/uL (ref 0.1–1.0)
NEUTROS PCT: 76 %
Neutro Abs: 4.4 10*3/uL (ref 1.7–7.7)
Platelets: 282 10*3/uL (ref 150–400)
RBC: 2.77 MIL/uL — ABNORMAL LOW (ref 4.22–5.81)
RDW: 15.5 % (ref 11.5–15.5)
WBC: 5.9 10*3/uL (ref 4.0–10.5)

## 2017-11-16 LAB — SODIUM, URINE, RANDOM: Sodium, Ur: 116 mmol/L

## 2017-11-16 LAB — URINALYSIS, ROUTINE W REFLEX MICROSCOPIC
Bacteria, UA: NONE SEEN
Bilirubin Urine: NEGATIVE
Glucose, UA: NEGATIVE mg/dL
KETONES UR: NEGATIVE mg/dL
Leukocytes, UA: NEGATIVE
Nitrite: NEGATIVE
PH: 7 (ref 5.0–8.0)
Protein, ur: NEGATIVE mg/dL
Specific Gravity, Urine: 1.005 (ref 1.005–1.030)

## 2017-11-16 LAB — RENAL FUNCTION PANEL
Albumin: 2.1 g/dL — ABNORMAL LOW (ref 3.5–5.0)
Anion gap: 9 (ref 5–15)
BUN: 74 mg/dL — ABNORMAL HIGH (ref 6–20)
CHLORIDE: 104 mmol/L (ref 101–111)
CO2: 26 mmol/L (ref 22–32)
CREATININE: 4.43 mg/dL — AB (ref 0.61–1.24)
Calcium: 8.4 mg/dL — ABNORMAL LOW (ref 8.9–10.3)
GFR calc non Af Amer: 12 mL/min — ABNORMAL LOW (ref >60)
GFR, EST AFRICAN AMERICAN: 14 mL/min — AB (ref >60)
Glucose, Bld: 96 mg/dL (ref 65–99)
Phosphorus: 4.9 mg/dL — ABNORMAL HIGH (ref 2.5–4.6)
Potassium: 3.8 mmol/L (ref 3.5–5.1)
Sodium: 139 mmol/L (ref 135–145)

## 2017-11-16 LAB — CREATININE, URINE, RANDOM: CREATININE, URINE: 25.6 mg/dL

## 2017-11-16 MED ORDER — FUROSEMIDE 10 MG/ML IJ SOLN
80.0000 mg | Freq: Three times a day (TID) | INTRAMUSCULAR | Status: DC
  Administered 2017-11-16 – 2017-11-18 (×7): 80 mg via INTRAVENOUS
  Filled 2017-11-16 (×7): qty 8

## 2017-11-16 NOTE — Progress Notes (Signed)
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  CT scan of the abdomen showed evidence of diverticulitis.  Patient was subsequently found to have Pseudomonas bacteremia with possible ATN.  Patient admitted for further management.  Due to history of renal transplant, patient has been monitored closely and will be discharged home upon significant improvement of his renal function.  Today, patient denies any fever/chills, nausea/vomiting, abdominal pain, chest pain. Creatinine still rising, nephrology on board.   Assessment/Plan: Active Problems:   Acute kidney injury superimposed on CKD [facility]): Stage 4   Dehydration   HTN (hypertension)   Hyperlipidemia   Renal transplant, status post: 21 years ago: 1997   Iron deficiency anemia   Sepsis ([place])   Gastroenteritis   Sepsis due to Pseudomonas ([place])  Sepsis due to Pseudomonas bacteremia Currently afebrile, with no leukocytosis Etiology likely from ??  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 Likely due to sepsis Vs renal congestion  FENa consistent with intrinsic renal etiology Nephrology consulted: Possible vol overload, start IV lasix and monitor BNP, CXR and ECHO pending  History of renal transplant Continue cyclosporine, steroids Nephrology on  board Monitor closely  Hypertension Uncontrolled Continue hydralazine 75 mg every 8 hours Held home metoprolol due to persistent sinus bradycardia, plan to DC upon discharge  Iron deficiency anemia Stable   Code Status: DNR  Family Communication: Daughter at bedside  Disposition Plan: Home once significant improvement of renal function   Consultants:  Nephrology  Procedures:  None  Antimicrobials: Levaquin  DVT prophylaxis: Heparin   Objective: Vitals:   11/16/17 0425 11/16/17 0427 11/16/17 1234 11/16/17 2054  BP:  (!) 174/73 (!) 155/74 (!) 168/73  Pulse:  79 85 78  Resp:  20 17 18   Temp:  98.3 F (36.8 C) 97.7 F (36.5 C) 98.8 F (37.1 C)  TempSrc:   Oral Oral  SpO2:  95% 98% 97%  Weight: 81.2 kg (179 lb 0.2 oz)     Height:        Intake/Output Summary (Last 24 hours) at 11/16/2017 2149 Last data filed at 11/16/2017 1921 Gross per 24 hour  Intake 120 ml  Output 1600 ml  Net -1480 ml   Filed Weights   11/14/17 0203 11/15/17 0533 11/16/17 0425  Weight: 82.4 kg (181 lb 10.5 oz) 81.9 kg (180 lb 8.9 oz) 81.2 kg (179 lb 0.2 oz)    Exam:   General: NAD  Cardiovascular: S1, S2 present  Respiratory: CTAB  Abdomen: Soft, nontender, nondistended, bowel sounds present  Musculoskeletal: +1 bilateral pedal edema  Skin: Normal  Psychiatry: Normal mood   Data Reviewed: CBC: Recent Labs  Lab 11/10/17 1200 11/11/17 0548 11/12/17 0514 11/13/17 0529 11/16/17 0543  WBC 7.2 4.9 5.0 6.4 5.9  NEUTROABS 5.9  --   --   --  4.4  HGB 8.6* 8.3* 8.0* 9.0* 8.4*  HCT 26.5* 25.0* 23.6* 27.8* 26.2*  MCV 91.4 92.9  90.4 91.4 94.6  PLT 174 140* 161 199 962   Basic Metabolic Panel: Recent Labs  Lab 11/11/17 0548 11/12/17 0514 11/13/17 0529 11/14/17 0517 11/15/17 0536 11/16/17 0543  NA 137 135 137 134* 135 139  K 4.0 3.2* 3.0* 3.3* 3.6 3.8  CL 106 100* 100* 101 102 104  CO2 21* 22 22 22 23 26   GLUCOSE 228* 172* 100* 96 98 96  BUN 81* 82* 86* 79*  78* 74*  CREATININE 4.25* 4.34* 4.31* 4.17* 4.40* 4.43*  CALCIUM 7.9* 7.4* 8.1* 8.4* 8.2* 8.4*  MG 1.9  --   --   --   --   --   PHOS 5.2* 4.6 4.3 4.2 4.9* 4.9*   GFR: Estimated Creatinine Clearance: 14.2 mL/min (A) (by C-G formula based on SCr of 4.43 mg/dL (H)). Liver Function Tests: Recent Labs  Lab 11/10/17 1200  11/12/17 0514 11/13/17 0529 11/14/17 0517 11/15/17 0536 11/16/17 0543  AST 20  --   --   --   --   --   --   ALT 11*  --   --   --   --   --   --   ALKPHOS 38  --   --   --   --   --   --   BILITOT 0.4  --   --   --   --   --   --   PROT 4.8*  --   --   --   --   --   --   ALBUMIN 2.1*   < > 1.9* 2.1* 2.1* 2.1* 2.1*   < > = values in this interval not displayed.   No results for input(s): LIPASE, AMYLASE in the last 168 hours. No results for input(s): AMMONIA in the last 168 hours. Coagulation Profile: No results for input(s): INR, PROTIME in the last 168 hours. Cardiac Enzymes: No results for input(s): CKTOTAL, CKMB, CKMBINDEX, TROPONINI in the last 168 hours. BNP (last 3 results) No results for input(s): PROBNP in the last 8760 hours. HbA1C: No results for input(s): HGBA1C in the last 72 hours. CBG: No results for input(s): GLUCAP in the last 168 hours. Lipid Profile: No results for input(s): CHOL, HDL, LDLCALC, TRIG, CHOLHDL, LDLDIRECT in the last 72 hours. Thyroid Function Tests: No results for input(s): TSH, T4TOTAL, FREET4, T3FREE, THYROIDAB in the last 72 hours. Anemia Panel: No results for input(s): VITAMINB12, FOLATE, FERRITIN, TIBC, IRON, RETICCTPCT in the last 72 hours. Urine analysis:    Component Value Date/Time   COLORURINE STRAW (A) 11/16/2017 1900   APPEARANCEUR CLEAR 11/16/2017 1900   LABSPEC 1.005 11/16/2017 1900   PHURINE 7.0 11/16/2017 1900   GLUCOSEU NEGATIVE 11/16/2017 1900   HGBUR MODERATE (A) 11/16/2017 1900   BILIRUBINUR NEGATIVE 11/16/2017 1900   KETONESUR NEGATIVE 11/16/2017 1900   PROTEINUR NEGATIVE 11/16/2017 1900    NITRITE NEGATIVE 11/16/2017 1900   LEUKOCYTESUR NEGATIVE 11/16/2017 1900   Sepsis Labs: @LABRCNTIP (procalcitonin:4,lacticidven:4)  ) Recent Results (from the past 240 hour(s))  Blood Culture (routine x 2)     Status: Abnormal   Collection Time: 11/07/17  4:40 PM  Result Value Ref Range Status   Specimen Description   Final    BLOOD RIGHT ANTECUBITAL Performed at [facility], [place] [address] [place], [place] [address]    Special Requests   Final    BOTTLES DRAWN AEROBIC AND ANAEROBIC Blood Culture adequate volume Performed at [place] [address]  [place], [place] [address]    Culture  Setup Time   Final    GRAM NEGATIVE RODS ANAEROBIC BOTTLE ONLY CRITICAL RESULT CALLED TO, READ BACK BY AND VERIFIED WITH: A PHAM PHARMD 1807 11/08/17 A BROWNING    Culture (A)  Final    PSEUDOMONAS AERUGINOSA SUSCEPTIBILITIES PERFORMED ON PREVIOUS CULTURE WITHIN THE LAST 5 DAYS. Performed at [facility], [place] [address] [place], [place] [address]    Report Status 11/13/2017 FINAL  Final   Organism ID, Bacteria PSEUDOMONAS AERUGINOSA  Final      Susceptibility   Pseudomonas aeruginosa - MIC*    CEFTAZIDIME 4 SENSITIVE Sensitive     CIPROFLOXACIN <=0.25 SENSITIVE Sensitive     GENTAMICIN 8 INTERMEDIATE Intermediate     IMIPENEM 1 SENSITIVE Sensitive     PIP/TAZO <=4 SENSITIVE Sensitive     CEFEPIME 8 SENSITIVE Sensitive     * PSEUDOMONAS AERUGINOSA  Blood Culture ID Panel (Reflexed)     Status: Abnormal   Collection Time: 11/07/17  4:40 PM  Result Value Ref Range Status   Enterococcus species NOT DETECTED NOT DETECTED Final   Listeria monocytogenes NOT DETECTED NOT DETECTED Final   Staphylococcus species NOT DETECTED NOT DETECTED Final   Staphylococcus aureus NOT DETECTED NOT DETECTED Final   Streptococcus species NOT DETECTED NOT DETECTED Final   Streptococcus agalactiae NOT DETECTED NOT DETECTED Final   Streptococcus pneumoniae NOT  DETECTED NOT DETECTED Final   Streptococcus pyogenes NOT DETECTED NOT DETECTED Final   Acinetobacter baumannii NOT DETECTED NOT DETECTED Final   Enterobacteriaceae species NOT DETECTED NOT DETECTED Final   Enterobacter cloacae complex NOT DETECTED NOT DETECTED Final   Escherichia coli NOT DETECTED NOT DETECTED Final   Klebsiella oxytoca NOT DETECTED NOT DETECTED Final   Klebsiella pneumoniae NOT DETECTED NOT DETECTED Final   Proteus species NOT DETECTED NOT DETECTED Final   Serratia marcescens NOT DETECTED NOT DETECTED Final   Carbapenem resistance NOT DETECTED NOT DETECTED Final   Haemophilus influenzae NOT DETECTED NOT DETECTED Final   Neisseria meningitidis NOT DETECTED NOT DETECTED Final   Pseudomonas aeruginosa DETECTED (A) NOT DETECTED Final    Comment: CRITICAL RESULT CALLED TO, READ BACK BY AND VERIFIED WITH: A PHAM PHARMD 1807 11/08/17 A BROWNING    Candida albicans NOT DETECTED NOT DETECTED Final   Candida glabrata NOT DETECTED NOT DETECTED Final   Candida krusei NOT DETECTED NOT DETECTED Final   Candida parapsilosis NOT DETECTED NOT DETECTED Final   Candida tropicalis NOT DETECTED NOT DETECTED Final    Comment: Performed at [facility], [place] [address] [place], [place] [address]  Blood Culture (routine x 2)     Status: Abnormal   Collection Time: 11/07/17  4:53 PM  Result Value Ref Range Status   Specimen Description   Final    BLOOD RIGHT HAND Performed at [place] [address], [place], [place] [address]    Special Requests   Final    BOTTLES DRAWN AEROBIC ONLY Blood Culture results may not be optimal due to an inadequate volume of blood received in culture bottles Performed at [facility] [address] [place], [place] [address]    Culture  Setup Time   Final    GRAM NEGATIVE RODS AEROBIC BOTTLE ONLY CRITICAL VALUE NOTED.  VALUE IS CONSISTENT WITH PREVIOUSLY REPORTED AND CALLED VALUE.    Culture (A)  Final     PSEUDOMONAS AERUGINOSA SUSCEPTIBILITIES PERFORMED ON PREVIOUS CULTURE WITHIN THE  LAST 5 DAYS. Performed at [facility], [place] [address], [place], [place] [address]    Report Status 11/11/2017 FINAL  Final  Urine culture     Status: None   Collection Time: 11/07/17  5:15 PM  Result Value Ref Range Status   Specimen Description   Final    URINE, RANDOM Performed at [place] [address], [place], [place] [address]    Special Requests   Final    NONE Performed at [facility], [place] [address], [place], [place] [address]    Culture   Final    NO GROWTH Performed at [facility], [place] [address], [place], [place] [address]    Report Status 11/09/2017 FINAL  Final  Gastrointestinal Panel by PCR , Stool     Status: None   Collection Time: 11/08/17  9:28 AM  Result Value Ref Range Status   Campylobacter species NOT DETECTED NOT DETECTED Final   Plesimonas shigelloides NOT DETECTED NOT DETECTED Final   Salmonella species NOT DETECTED NOT DETECTED Final   Yersinia enterocolitica NOT DETECTED NOT DETECTED Final   Vibrio species NOT DETECTED NOT DETECTED Final   Vibrio cholerae NOT DETECTED NOT DETECTED Final   Enteroaggregative E coli (EAEC) NOT DETECTED NOT DETECTED Final   Enteropathogenic E coli (EPEC) NOT DETECTED NOT DETECTED Final   Enterotoxigenic E coli (ETEC) NOT DETECTED NOT DETECTED Final   Shiga like toxin producing E coli (STEC) NOT DETECTED NOT DETECTED Final   Shigella/Enteroinvasive E coli (EIEC) NOT DETECTED NOT DETECTED Final   Cryptosporidium NOT DETECTED NOT DETECTED Final   Cyclospora cayetanensis NOT DETECTED NOT DETECTED Final   Entamoeba histolytica NOT DETECTED NOT DETECTED Final   Giardia lamblia NOT DETECTED NOT DETECTED Final   Adenovirus F40/41 NOT DETECTED NOT DETECTED Final   Astrovirus NOT DETECTED NOT DETECTED Final   Norovirus GI/GII NOT DETECTED NOT DETECTED Final   Rotavirus A NOT DETECTED NOT  DETECTED Final   Sapovirus (I, II, IV, and V) NOT DETECTED NOT DETECTED Final    Comment: Performed at [facility], [place] [place], [place] [address]  Culture, blood (routine x 2)     Status: None   Collection Time: 11/09/17  7:53 AM  Result Value Ref Range Status   Specimen Description   Final    BLOOD RIGHT ANTECUBITAL Performed at [facility], [place] [address], [place], [place] [address]    Special Requests   Final    AEROBIC BOTTLE ONLY Blood Culture results may not be optimal due to an inadequate volume of blood received in culture bottles Performed at [place] [address], [place], [place] [address]    Culture   Final    NO GROWTH 5 DAYS Performed at [facility], [place] [address] [place], [place] [address]    Report Status 11/14/2017 FINAL  Final  Culture, blood (routine x 2)     Status: None   Collection Time: 11/09/17  7:53 AM  Result Value Ref Range Status   Specimen Description   Final    BLOOD RIGHT ANTECUBITAL Performed at [place] [address] [place], [place] [address]    Special Requests   Final    BOTTLES DRAWN AEROBIC AND ANAEROBIC Blood Culture adequate volume Performed at [place] [address], [place], [place] [address]    Culture   Final    NO  GROWTH 5 DAYS Performed at [facility], [place] [address], [place], [place] [address]    Report Status 11/14/2017 FINAL  Final      Studies: US Renal Transplant W/doppler  Result Date: 11/16/2017 CLINICAL DATA:  77 year old male with a history of prior renal transplant. Acute kidney injury with hypertension history EXAM: ULTRASOUND OF RENAL TRANSPLANT WITH RENAL DOPPLER ULTRASOUND TECHNIQUE: Ultrasound examination of the renal transplant was performed with gray-scale, color and duplex doppler evaluation. COMPARISON:  CT 11/09/2017 FINDINGS: Transplant kidney location: RLQ Transplant Kidney:  Length: 4.8 cm. Normal in size and parenchymal echogenicity. No hydronephrosis. Cortical thinning. No significant hyperechoic echotexture of the parenchyma. No peri-transplant fluid collection seen. Anechoic cystic lesion on the lateral cortex measures 1.1 cm x 1.1 cm x 1.1 cm with no flow, internal complexity, and with through transmission. Color flow in the main renal artery:  Yes Color flow in the main renal vein:  Yes Duplex Doppler Evaluation: Main Renal Artery Resistive Index: 0.83 Venous waveform in main renal vein:  Present Intrarenal resistive index in upper pole:  0.74 (normal 0.6-0.8; equivocal 0.8-0.9; abnormal >= 0.9) Intrarenal resistive index in lower pole: 0.54 (normal 0.6-0.8; equivocal 0.8-0.9; abnormal >= 0.9) Bladder: Normal for degree of bladder distention. Other findings:  None. IMPRESSION: No evidence of hydronephrosis of right lower quadrant transplant kidney. The echogenicity is within normal limits. Resistive index of the main renal artery in the equivocal range, however, the RI at the superior and lower pole measured within normal limits. Benign cyst on the lateral cortex. Electronically Signed   By: Corrie Mckusick D.O.   On: 11/16/2017 16:26    Scheduled Meds: . cycloSPORINE modified  50 mg Oral QHS  . cycloSPORINE modified  75 mg Oral Daily  . furosemide  80 mg Intravenous Q8H  . heparin injection (subcutaneous)  5,000 Units Subcutaneous Q8H  . hydrALAZINE  75 mg Oral Q8H  . levofloxacin  500 mg Oral Q48H  . predniSONE  5 mg Oral Q breakfast    Continuous Infusions:   LOS: 7 days     Alma Friendly, MD [facility]   If 7PM-7AM, please contact night-coverage [email or web address] Password [facility] 11/16/2017, 9:49 PM

## 2017-11-16 NOTE — Progress Notes (Signed)
Pharmacy Antibiotic Note  Caleb Hicks is a 77 y.o. male admitted on 11/07/2017 with pseudomonas bacteremia.  Pharmacy was consulted for zosyn dosing. Patient with CKD, s/p renal transplant, on Cyclosporine.  Today, 11/16/2017:  D9 full abx  Remains afebrile  Leukocytosis resolved 5/25, although patient on immunosuppressive drugs  SCr still elevated, CrCl remains 10-20 ml/min   Plan:  Continue Levaquin 500 mg po q48 hr; will receive last dose tomorrow (6/1) which will provide 11 full days coverage  Although patient on immunosuppression, this LOT was felt adequate since patient has been clinically improved for ~1 wk, source infection has resolved, and surveillance cultures were clear  Height: 5\' 7"  (170.2 cm) Weight: 179 lb 0.2 oz (81.2 kg) IBW/kg (Calculated) : 66.1  Temp (24hrs), Avg:98.2 F (36.8 C), Min:97.9 F (36.6 C), Max:98.4 F (36.9 C)  Recent Labs  Lab 11/10/17 1200 11/11/17 0548 11/12/17 0514 11/13/17 0529 11/14/17 0517 11/15/17 0536 11/16/17 0543  WBC 7.2 4.9 5.0 6.4  --   --  5.9  CREATININE 3.99* 4.25* 4.34* 4.31* 4.17* 4.40* 4.43*    Estimated Creatinine Clearance: 14.2 mL/min (A) (by C-G formula based on SCr of 4.43 mg/dL (H)).    Allergies  Allergen Reactions  . Norvasc [Amlodipine Besylate]     Pt state it cause him weight gain  . Tape Other (See Comments)    Antimicrobials this admission:  5/22 vanc>> 5/23 5/22 zosyn>> 5/23 resumed >> 5/27 5/26 Levaquin >>  Dose adjustments this admission:   Microbiology results:  5/22 BCx x2:  2 of 2 GNR> BCID with PsA. (S ceftaz, cipro, imip, zosyn, cefepime) 5/22 UCx: NGF 5/23 GI panel: neg 5/24 BCx: NGF  Thank you for allowing pharmacy to be a part of this patient's care.  Reuel Boom, PharmD, BCPS [phone] 11/16/2017, 11:34 AM

## 2017-11-16 NOTE — Progress Notes (Signed)
Physical Therapy Treatment Patient Details Name: Caleb Hicks MRN: [id number] DOB: August 11, 1940 Today's Date: 11/16/2017    History of Present Illness Pt was admitted for dehydration/gastroenteritis, AKI on CKD.  He has a history of CKD, kidney transplant and HTN    PT Comments    Pt ambulated in hallway and tolerated 300 feet well.  Pt with weeping L UE so elevated once in recliner.  Pt encouraged to ambulate a few times each day.  Daughter present and plans to ambulate with him.  Nursing staff also aware.   Follow Up Recommendations  Home health PT     Equipment Recommendations  None recommended by PT    Recommendations for Other Services       Precautions / Restrictions Precautions Precautions: Fall Precaution Comments: weeping L UE    Mobility  Bed Mobility Overal bed mobility: Modified Independent                Transfers Overall transfer level: Needs assistance Equipment used: Rolling walker (2 wheeled) Transfers: Sit to/from Stand Sit to Stand: Supervision         General transfer comment: VCs hand placement  Ambulation/Gait Ambulation/Gait assistance: Supervision;Min guard Ambulation Distance (Feet): 300 Feet Assistive device: Rolling walker (2 wheeled) Gait Pattern/deviations: Step-through pattern     General Gait Details: slow but steady pace, no rest breaks required today   Stairs             Wheelchair Mobility    Modified Rankin (Stroke Patients Only)       Balance                                            Cognition Arousal/Alertness: Awake/alert Behavior During Therapy: WFL for tasks assessed/performed Overall Cognitive Status: Within Functional Limits for tasks assessed                                        Exercises      General Comments        Pertinent Vitals/Pain Pain Assessment: No/denies pain    Home Living                      Prior Function             PT Goals (current goals can now be found in the care plan section) Progress towards PT goals: Progressing toward goals    Frequency    Min 3X/week      PT Plan Current plan remains appropriate    Co-evaluation              AM-PAC PT "6 Clicks" Daily Activity  Outcome Measure  Difficulty turning over in bed (including adjusting bedclothes, sheets and blankets)?: A Little Difficulty moving from lying on back to sitting on the side of the bed? : A Little Difficulty sitting down on and standing up from a chair with arms (e.g., wheelchair, bedside commode, etc,.)?: A Little Help needed moving to and from a bed to chair (including a wheelchair)?: A Little Help needed walking in hospital room?: A Little Help needed climbing 3-5 steps with a railing? : A Little 6 Click Score: 18    End of Session Equipment Utilized During Treatment: Gait belt Activity Tolerance: Patient tolerated treatment  well Patient left: with call bell/phone within reach;in chair;with chair alarm set;with family/visitor present Nurse Communication: Mobility status PT Visit Diagnosis: Unsteadiness on feet (R26.81)     Time: 3149-7026 PT Time Calculation (min) (ACUTE ONLY): 12 min  Charges:  $Gait Training: 8-22 mins                    G Codes:      Carmelia Bake, PT, DPT 11/16/2017 Pager: [phone]   York Ram E 11/16/2017, 1:20 PM

## 2017-11-16 NOTE — Consult Note (Signed)
Renal Service Consult Note [facility]  Caleb Hicks 11/16/2017 Caleb Hicks Requesting Physician:  Dr Horris Latino   Reason for Consult:  Acute on chronic renal failure HPI: The patient is a 77 y.o. year-old with hx of CKD and renal transplant in 1997 hypertension gout admitted on 5/22 for nausea/ vomiting diarrhea gen'd fatigue and shaking chills x 24 hrs. CT showed acute diverticulitis treated with iv vanc/ zosyn and then switched to levofloxacin on 5/26.  Got one dose vanc on 5/22.  Creat on admission was 2.5, increased steadily to 4.1- 4.3 range and is stuck there now.  No nsaids , acei/ arb, has been getting po hydralazine/ metoprolol for bp control, bp's have been high mostly.  ua on admit showed hematuria and 100 protein. Patient denies any sob/ orthopnea, +leg edme and arm edema no abd pain and n/v/d resolved,eating ok , mild fatigue. Says his creat "went up to 10" before they put him on dialysis in the 1908's prior to his renal tx.   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  CyA level here 115 within range for renal Tx. No pain or transplant.   No tob / etoh.   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 AAA (abdominal aortic aneurysm) ([place]): 4 cm 03/24/2017   3.5X 3. 5 CM PER 10-6 ABDOMINAL CT FOLOOWED EVRY 2 YEARS   . Abdominal aneurysm ([place])   . Diverticulitis 03/2017   1 UNIT BLOOD GIVEN  . Gout   . Hepatic cyst: Solitary 03/24/2017  . Hydrocele sac   . Hyperlipidemia 03/24/2017  . Hypertension   . Hyperuricemia 03/24/2017  . 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 Renal transplant, status post: 21 years ago: 1997 03/24/2017   Past Surgical History  Past Surgical History:  Procedure Laterality Date  . CYST REMOVED FROM SPINE  YRS AGO  . HERNIA REPAIR     SEVERAL  . HYDROCELE EXCISION Bilateral 06/04/2017   Procedure: BILATERAL HYDROCELECTOMY ADULT;  Surgeon: Lucas Mallow, MD;  Location[facility];  Service: Urology;  Laterality: Bilateral;  . PILIODINAL CYST  40-50 YRS AGO   Family History  Family History  Problem Relation Age of Onset  . Heart attack Father    Social History  reports that he has quit smoking.  His smoking use included cigarettes. He has a 40.00 pack-year smoking history. He has never used smokeless tobacco. He reports that he does not drink alcohol or use drugs. Allergies  Allergies  Allergen Reactions  . Norvasc [Amlodipine Besylate]     Pt state it cause him weight gain  . Tape Other (See Comments)   Home medications Prior to Admission medications   Medication Sig Start Date End Date Taking? Authorizing Provider  allopurinol (ZYLOPRIM) 100 MG tablet Take 100 mg by mouth 2 (two) times daily. 03/13/17  Yes [provider]  atorvastatin (LIPITOR) 20 MG tablet Take 20 mg by mouth daily. 02/20/17  Yes [provider]  calcium gluconate 500 MG tablet Take 500-1,000 mg by mouth 2 (two) times daily. 2 in am, 1 in pm   Yes [provider]  furosemide (LASIX) 40 MG tablet Take 40 mg by mouth daily.    Yes [provider]  metoprolol tartrate (LOPRESSOR) 50 MG tablet Take 50 mg by mouth 2 (two) times daily. 12/25/16  Yes [provider]  NEORAL 25 MG capsule Take 50-75 mg by mouth 2 (two) times daily. Take 75 mg (3 capsules) in the morning and 50 mg (2 capsules) in the evening 02/27/17  Yes [provider]  predniSONE (DELTASONE) 5 MG tablet Take 5 mg by mouth daily. 03/16/17  Yes [provider]  senna-docusate (SENOKOT-S) 8.6-50 MG tablet Take 1 tablet by mouth at bedtime. Patient taking differently: Take 1 tablet by mouth at bedtime as needed for moderate constipation.  03/28/17  Yes Eugenie Filler, MD  telmisartan (MICARDIS) 80 MG tablet Take 80 mg by mouth daily. 09/24/17  Yes [provider]  HYDROcodone-acetaminophen (NORCO/VICODIN) 5-325 MG tablet Take 1 tablet by mouth every 4 (four) hours as needed for moderate pain. Patient not taking: Reported on 11/07/2017 06/04/17 06/04/18  Lucas Mallow, MD   Liver Function Tests Recent Labs  Lab 11/10/17 1200  11/14/17 0517 11/15/17 0536 11/16/17 0543  AST 20  --   --   --   --   ALT 11*  --   --   --   --   ALKPHOS 38  --   --   --   --   BILITOT 0.4  --   --   --   --   PROT 4.8*  --   --   --   --   ALBUMIN 2.1*   < > 2.1* 2.1* 2.1*   < > = values in this interval not displayed.   No results for input(s): LIPASE, AMYLASE in the last 168 hours. CBC Recent Labs  Lab 11/10/17 1200  11/12/17 0514 11/13/17 0529 11/16/17 0543  WBC 7.2   < > 5.0 6.4 5.9  NEUTROABS 5.9  --   --   --  4.4  HGB 8.6*   < > 8.0* 9.0* 8.4*  HCT 26.5*   < > 23.6* 27.8* 26.2*  MCV 91.4   < > 90.4 91.4 94.6  PLT 174   < > 161 199 282   < > = values in this interval not displayed.   Basic Metabolic Panel Recent Labs  Lab 11/10/17 1200 11/11/17 0548 11/12/17 0514 11/13/17 0529 11/14/17 0517 11/15/17 0536 11/16/17 0543  NA 138 137 135 137 134* 135 139  K 4.5 4.0 3.2* 3.0* 3.3* 3.6 3.8  CL 112* 106 100* 100* 101  102 104  CO2 14* 21* _0 23  26  GLUCOSE 168* 228* 172* 100* 96 98 96  BUN 80* 81* 82* 86* 79* 78* 74*  CREATININE 3.99* 4.25* 4.34* 4.31* 4.17* 4.40* 4.43*  CALCIUM 7.9* 7.9* 7.4* 8.1* 8.4* 8.2* 8.4*  PHOS  --  5.2* 4.6 4.3 4.2 4.9* 4.9*   Iron/TIBC/Ferritin/ %Sat    Component Value Date/Time   IRON 19 (L) 03/24/2017 1548   TIBC 223 (L) 03/24/2017 1548   FERRITIN 52 03/24/2017 1548   IRONPCTSAT 9 (L) 03/24/2017 1548    Vitals:   11/15/17 1327 11/15/17 2037 11/16/17 0425 11/16/17 0427  BP: (!) 155/74 (!) 148/69  (!) 174/73  Pulse: 96 68  79  Resp: _0 Temp: 97.9 F (36.6 C) 98.4 F (36.9 C)  98.3 F (36.8 C)  TempSrc: Oral Oral    SpO2: 98% 98%  95%  Weight:   81.2 kg (179 lb 0.2 oz)   Height:       Exam Gen alert chron ill no distress  No rash, cyanosis or gangrene Sclera anicteric, throat clear  +JVD Chest slight basilar crackles bilat RRR no MRG Abd soft ntnd no mass or ascites +bs GU normal male MS no joint effusions or deformity Ext 2+ bilat LE and UE edema Neuro is alert, Ox 3 , nf   na 139 K 3.8  Bun 74  Creat 4.43  Alb 2.1 lft's ok    wbc 5k hb 8.4 plt 282  ua on admission 5/22 > 20-50 rbc/ 0-5 wbc 100 prot clear  CT abd 5/24 > Impression: 1. Acute uncomplicated distal sigmoid diverticulitis with mild pericolonic inflammation.  2. Marked atrophy of the native renal kidneys with simple and complex cysts as before. Stable appearing transplanted right iliac fossa kidney. 3. Stable 3.5 cm infrarenal abdominal aortic aneurysm. Recommend followup by ultrasound in 2 years. 4. Uncomplicated cholelithiasis. 5. Coronary arteriosclerosis.    Impression: 1 acute on ckd4 in renal transplant - bp's up and volume overloaded, aki may be due to chf exacerbation, don't see another explanation will repeat ua and urine lytes and start diuresis w/ iv lasix not uremic at this time and doesn't require dialysis doubt rejection and cya levels is not toxic 2 vol  overload 3 acute diverticulitis - symptoms better on levaquin now 4 hypertension - bp's up on hydral/ mtp    Plan - will follow  Kelly Splinter MD [place] pager [phone]   11/16/2017, 9:06 AM

## 2017-11-17 ENCOUNTER — Inpatient Hospital Stay (HOSPITAL_COMMUNITY): Payer: Medicare Other

## 2017-11-17 DIAGNOSIS — R0600 Dyspnea, unspecified: Secondary | ICD-10-CM

## 2017-11-17 LAB — CBC WITH DIFFERENTIAL/PLATELET
BASOS ABS: 0 10*3/uL (ref 0.0–0.1)
Basophils Relative: 0 %
Eosinophils Absolute: 0.1 10*3/uL (ref 0.0–0.7)
Eosinophils Relative: 2 %
HCT: 26.4 % — ABNORMAL LOW (ref 39.0–52.0)
Hemoglobin: 8.4 g/dL — ABNORMAL LOW (ref 13.0–17.0)
LYMPHS PCT: 16 %
Lymphs Abs: 0.9 10*3/uL (ref 0.7–4.0)
MCH: 30 pg (ref 26.0–34.0)
MCHC: 31.8 g/dL (ref 30.0–36.0)
MCV: 94.3 fL (ref 78.0–100.0)
MONO ABS: 0.4 10*3/uL (ref 0.1–1.0)
Monocytes Relative: 7 %
NEUTROS ABS: 4.1 10*3/uL (ref 1.7–7.7)
Neutrophils Relative %: 75 %
PLATELETS: 286 10*3/uL (ref 150–400)
RBC: 2.8 MIL/uL — AB (ref 4.22–5.81)
RDW: 15.5 % (ref 11.5–15.5)
WBC: 5.5 10*3/uL (ref 4.0–10.5)

## 2017-11-17 LAB — RENAL FUNCTION PANEL
ALBUMIN: 2.3 g/dL — AB (ref 3.5–5.0)
ANION GAP: 10 (ref 5–15)
BUN: 76 mg/dL — ABNORMAL HIGH (ref 6–20)
CO2: 24 mmol/L (ref 22–32)
Calcium: 8.6 mg/dL — ABNORMAL LOW (ref 8.9–10.3)
Chloride: 104 mmol/L (ref 101–111)
Creatinine, Ser: 4.49 mg/dL — ABNORMAL HIGH (ref 0.61–1.24)
GFR calc Af Amer: 13 mL/min — ABNORMAL LOW (ref >60)
GFR calc non Af Amer: 11 mL/min — ABNORMAL LOW (ref >60)
GLUCOSE: 91 mg/dL (ref 65–99)
PHOSPHORUS: 5.1 mg/dL — AB (ref 2.5–4.6)
Potassium: 4.1 mmol/L (ref 3.5–5.1)
Sodium: 138 mmol/L (ref 135–145)

## 2017-11-17 LAB — ECHOCARDIOGRAM COMPLETE
Height: 67 in
WEIGHTICAEL: 2754.87 [oz_av]

## 2017-11-17 LAB — BRAIN NATRIURETIC PEPTIDE: B Natriuretic Peptide: 150.7 pg/mL — ABNORMAL HIGH (ref 0.0–100.0)

## 2017-11-17 MED ORDER — HYDRALAZINE HCL 50 MG PO TABS
100.0000 mg | ORAL_TABLET | Freq: Three times a day (TID) | ORAL | Status: DC
  Administered 2017-11-17 – 2017-11-19 (×5): 100 mg via ORAL
  Filled 2017-11-17 (×5): qty 2

## 2017-11-17 NOTE — Progress Notes (Signed)
[facility] Progress Note  Subjective: 2300 cc uop on IV lasix, net neg 1.7L yest creat 4.4 today no change  Vitals:   11/16/17 1234 11/16/17 2054 11/17/17 0455 11/17/17 0500  BP: (!) 155/74 (!) 168/73 (!) 151/74   Pulse: 85 78 86   Resp: _0 Temp: 97.7 F (36.5 C) 98.8 F (37.1 C) 98.2 F (36.8 C)   TempSrc: Oral Oral Oral   SpO2: 98% 97% 95%   Weight:    78.1 kg (172 lb 2.9 oz)  Height:        Inpatient medications: . cycloSPORINE modified  50 mg Oral QHS  . cycloSPORINE modified  75 mg Oral Daily  . furosemide  80 mg Intravenous Q8H  . heparin injection (subcutaneous)  5,000 Units Subcutaneous Q8H  . hydrALAZINE  75 mg Oral Q8H  . predniSONE  5 mg Oral Q breakfast    acetaminophen **OR** acetaminophen, benzonatate, hydrALAZINE, ondansetron **OR** ondansetron (ZOFRAN) IV  Exam: Gen alert chron ill no distress  +JVD Chest slight basilar crackles bilat RRR no MRG Abd soft ntnd no mass or ascites +bs Ext diffuse pitting edema x 4 ext Neuro is alert, Ox 3 , nf no asterixis    date    Creat  Notes/ egfr  oct 2018   3.3 > 2.9 16 > 22/ aki episode  dec 2018   2.10  30     Nov 07, 2017 (admit)  2.54  23  may 24   3.57  may 30   4.40  12  jun 1    4.49  11    ua on admission 5/22 > 20-50 rbc/ 0-5 wbc 100 prot clear  cyclosporine level 115 ng/mL (100- 250)  CT abd 5/24 noncon > Impression: - Acute uncomplicated distal sigmoid diverticulitis with mild pericolonic inflammation.  - Marked atrophy of the native renal kidneys with simple and complex cysts as before. Stable appearing transplanted right iliac fossa kidney. - Stable AAA infrarenal    repeat ua 5/31 > 11-20 rbc/ 0-5 wbc, 7.0,  clear  urine sodium 5/31 - 116 (after lasix)  urine creat 5/31 - 25 (after lasix)  transplant renal US 5/31 > no hydro, +cortical thinning, RI upper limits to normal    Impression: 1 acute on ckd4 in renal transplant- bp's up and volume overloaded,  possible chf causing aki. No other cause identified at this time. Transplant [place] if normal.  No nsaids/ arb/ acei , no hypotension, cya levels not toxic, no indication for hd at this time. Started iv lasix on 5/31, good response so far. Stable creat. Cont to diurese. ^lasix 100 q 8 iv.  2 ckd stage IV transplant '97 - usual creat 2.1- 2.5 on cya/ pred followed by wfu 3 acute diverticulitis - symptoms better on levaquin now 4 hypertension - bp's on higher side, getting hydral 75 mg tid   Plan - as above   Kelly Splinter MD [place] pager [phone]   11/17/2017, 12:17 PM   Recent Labs  Lab 11/15/17 0536 11/16/17 0543 11/17/17 0538  NA 135 139 138  K 3.6 3.8 4.1  CL 102 104 104  CO2 _1 GLUCOSE 98 96 91  BUN 78* 74* 76*  CREATININE 4.40* 4.43* 4.49*  CALCIUM 8.2* 8.4* 8.6*  PHOS 4.9* 4.9* 5.1*   Recent Labs  Lab 11/15/17 0536 11/16/17 0543 11/17/17 0538  ALBUMIN 2.1* 2.1* 2.3*   Recent Labs  Lab 11/13/17  7366 11/16/17 0543 11/17/17 0538  WBC 6.4 5.9 5.5  NEUTROABS  --  4.4 4.1  HGB 9.0* 8.4* 8.4*  HCT 27.8* 26.2* 26.4*  MCV 91.4 94.6 94.3  PLT 199 282 286   Iron/TIBC/Ferritin/ %Sat    Component Value Date/Time   IRON 19 (L) 03/24/2017 1548   TIBC 223 (L) 03/24/2017 1548   FERRITIN 52 03/24/2017 1548   IRONPCTSAT 9 (L) 03/24/2017 1548

## 2017-11-17 NOTE — Progress Notes (Signed)
Echocardiogram 2D Echocardiogram has been performed.  Caleb Hicks 11/17/2017, 9:03 AM

## 2017-11-17 NOTE — Progress Notes (Signed)
PROGRESS NOTE  Neri Samek SWH:[id number] DOB: [birth year] DOA: 11/07/2017 PCP: Patient, No Pcp Per  HPI/Recap of past 24 hours: Axiel Fjeld is a 77 year old male with medical history significant for CKD status post renal transplant now stage III, IgA nephropathy, hypertension, hyperlipidemia, AAA, admitted on 11/07/2017 with complaints of generalized weakness, abdominal discomfort, nausea/diarrhea, found to have viral gastroenteritis with secondary adrenal insufficiency.  CT scan of the abdomen showed evidence of diverticulitis.  Patient was subsequently found to have Pseudomonas bacteremia with possible ATN.  Patient admitted for further management.  Due to history of renal transplant, patient has been monitored closely and will be discharged home upon significant improvement of his renal function.  Today, patient denies any new complaints.   Assessment/Plan: Active Problems:   Acute kidney injury superimposed on CKD [facility]): Stage 4   Dehydration   HTN (hypertension)   Hyperlipidemia   Renal transplant, status post: 21 years ago: 1997   Iron deficiency anemia   Sepsis ([place])   Gastroenteritis   Sepsis due to Pseudomonas ([place])  Sepsis due to Pseudomonas bacteremia Currently afebrile, with no leukocytosis Etiology likely from ??  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 Likely due to sepsis Vs renal congestion from CHF FENa consistent with intrinsic renal etiology Nephrology consulted: Vol overload, continue IV lasix and monitor Daily BMP  Chronic diastolic HF BNP 665, BLE edema CXR no congestion ECHO with EF 65-70%, Grade 1DD Continue IV lasix  History of renal transplant Continue  cyclosporine, steroids Nephrology on board Monitor closely  Hypertension Uncontrolled Increased hydralazine to 100 mg every 8 hours Held home metoprolol due to persistent sinus bradycardia, plan to DC upon discharge  Iron deficiency anemia Stable   Code Status: DNR  Family Communication: None at bedside  Disposition Plan: Home once significant improvement of renal function/diuresis   Consultants:  Nephrology  Procedures:  None  Antimicrobials: Levaquin  DVT prophylaxis: Heparin   Objective: Vitals:   11/16/17 2054 11/17/17 0455 11/17/17 0500 11/17/17 1438  BP: (!) 168/73 (!) 151/74  (!) 166/74  Pulse: 78 86  87  Resp: 18 18    Temp: 98.8 F (37.1 C) 98.2 F (36.8 C)  98.1 F (36.7 C)  TempSrc: Oral Oral  Oral  SpO2: 97% 95%  96%  Weight:   78.1 kg (172 lb 2.9 oz)   Height:        Intake/Output Summary (Last 24 hours) at 11/17/2017 1744 Last data filed at 11/17/2017 1643 Gross per 24 hour  Intake 660 ml  Output 3025 ml  Net -2365 ml   Filed Weights   11/15/17 0533 11/16/17 0425 11/17/17 0500  Weight: 81.9 kg (180 lb 8.9 oz) 81.2 kg (179 lb 0.2 oz) 78.1 kg (172 lb 2.9 oz)    Exam:   General: NAD  Cardiovascular: S1, S2 present  Respiratory: CTAB  Abdomen: Soft, nontender, nondistended, bowel sounds present  Musculoskeletal: +2 bilateral pedal edema  Skin: Normal  Psychiatry: Normal mood   Data Reviewed: CBC: Recent Labs  Lab 11/11/17 0548 11/12/17 0514 11/13/17 0529 11/16/17 0543 11/17/17 0538  WBC 4.9 5.0 6.4 5.9 5.5  NEUTROABS  --   --   --  4.4 4.1  HGB 8.3* 8.0* 9.0* 8.4*  8.4*  HCT 25.0* 23.6* 27.8* 26.2* 26.4*  MCV 92.9 90.4 91.4 94.6 94.3  PLT 140* 161 199 282 654   Basic Metabolic Panel: Recent Labs  Lab 11/11/17 0548  11/13/17 0529 11/14/17 0517 11/15/17 0536 11/16/17 0543 11/17/17 0538  NA 137   < > 137 134* 135 139 138  K 4.0   < > 3.0* 3.3* 3.6 3.8 4.1  CL 106   < > 100* 101 102 104 104  CO2 21*   < > 22  22 23 26 24   GLUCOSE 228*   < > 100* 96 98 96 91  BUN 81*   < > 86* 79* 78* 74* 76*  CREATININE 4.25*   < > 4.31* 4.17* 4.40* 4.43* 4.49*  CALCIUM 7.9*   < > 8.1* 8.4* 8.2* 8.4* 8.6*  MG 1.9  --   --   --   --   --   --   PHOS 5.2*   < > 4.3 4.2 4.9* 4.9* 5.1*   < > = values in this interval not displayed.   GFR: Estimated Creatinine Clearance: 12.9 mL/min (A) (by C-G formula based on SCr of 4.49 mg/dL (H)). Liver Function Tests: Recent Labs  Lab 11/13/17 0529 11/14/17 0517 11/15/17 0536 11/16/17 0543 11/17/17 0538  ALBUMIN 2.1* 2.1* 2.1* 2.1* 2.3*   No results for input(s): LIPASE, AMYLASE in the last 168 hours. No results for input(s): AMMONIA in the last 168 hours. Coagulation Profile: No results for input(s): INR, PROTIME in the last 168 hours. Cardiac Enzymes: No results for input(s): CKTOTAL, CKMB, CKMBINDEX, TROPONINI in the last 168 hours. BNP (last 3 results) No results for input(s): PROBNP in the last 8760 hours. HbA1C: No results for input(s): HGBA1C in the last 72 hours. CBG: No results for input(s): GLUCAP in the last 168 hours. Lipid Profile: No results for input(s): CHOL, HDL, LDLCALC, TRIG, CHOLHDL, LDLDIRECT in the last 72 hours. Thyroid Function Tests: No results for input(s): TSH, T4TOTAL, FREET4, T3FREE, THYROIDAB in the last 72 hours. Anemia Panel: No results for input(s): VITAMINB12, FOLATE, FERRITIN, TIBC, IRON, RETICCTPCT in the last 72 hours. Urine analysis:    Component Value Date/Time   COLORURINE STRAW (A) 11/16/2017 1900   APPEARANCEUR CLEAR 11/16/2017 1900   LABSPEC 1.005 11/16/2017 1900   PHURINE 7.0 11/16/2017 1900   GLUCOSEU NEGATIVE 11/16/2017 1900   HGBUR MODERATE (A) 11/16/2017 1900   BILIRUBINUR NEGATIVE 11/16/2017 1900   KETONESUR NEGATIVE 11/16/2017 1900   PROTEINUR NEGATIVE 11/16/2017 1900   NITRITE NEGATIVE 11/16/2017 1900   LEUKOCYTESUR NEGATIVE 11/16/2017 1900   Sepsis  Labs: @LABRCNTIP (procalcitonin:4,lacticidven:4)  ) Recent Results (from the past 240 hour(s))  Gastrointestinal Panel by PCR , Stool     Status: None   Collection Time: 11/08/17  9:28 AM  Result Value Ref Range Status   Campylobacter species NOT DETECTED NOT DETECTED Final   Plesimonas shigelloides NOT DETECTED NOT DETECTED Final   Salmonella species NOT DETECTED NOT DETECTED Final   Yersinia enterocolitica NOT DETECTED NOT DETECTED Final   Vibrio species NOT DETECTED NOT DETECTED Final   Vibrio cholerae NOT DETECTED NOT DETECTED Final   Enteroaggregative E coli (EAEC) NOT DETECTED NOT DETECTED Final   Enteropathogenic E coli (EPEC) NOT DETECTED NOT DETECTED Final   Enterotoxigenic E coli (ETEC) NOT DETECTED NOT DETECTED Final   Shiga like toxin producing E coli (STEC) NOT DETECTED NOT DETECTED Final   Shigella/Enteroinvasive E coli (EIEC) NOT DETECTED NOT DETECTED Final   Cryptosporidium NOT  DETECTED NOT DETECTED Final   Cyclospora cayetanensis NOT DETECTED NOT DETECTED Final   Entamoeba histolytica NOT DETECTED NOT DETECTED Final   Giardia lamblia NOT DETECTED NOT DETECTED Final   Adenovirus F40/41 NOT DETECTED NOT DETECTED Final   Astrovirus NOT DETECTED NOT DETECTED Final   Norovirus GI/GII NOT DETECTED NOT DETECTED Final   Rotavirus A NOT DETECTED NOT DETECTED Final   Sapovirus (I, II, IV, and V) NOT DETECTED NOT DETECTED Final    Comment: Performed at [facility], [address] [place], [place] [address]  Culture, blood (routine x 2)     Status: None   Collection Time: 11/09/17  7:53 AM  Result Value Ref Range Status   Specimen Description   Final    BLOOD RIGHT ANTECUBITAL Performed at [facility] [address] [place], [place] [address]    Special Requests   Final    AEROBIC BOTTLE ONLY Blood Culture results may not be optimal due to an inadequate volume of blood received in culture bottles Performed at [facility] [address], [place], [place] [address]    Culture   Final    NO GROWTH 5 DAYS Performed at [facility], [place] [address], [place], [place] [address]    Report Status 11/14/2017 FINAL  Final  Culture, blood (routine x 2)     Status: None   Collection Time: 11/09/17  7:53 AM  Result Value Ref Range Status   Specimen Description   Final    BLOOD RIGHT ANTECUBITAL Performed at [facility] [address] [place], [place] [address]    Special Requests   Final    BOTTLES DRAWN AEROBIC AND ANAEROBIC Blood Culture adequate volume Performed at [facility] [address] [place], [place] [address]    Culture   Final    NO GROWTH 5 DAYS Performed at [facility], [place] [address] [place], [place] [address]    Report Status 11/14/2017 FINAL  Final      Studies: Dg Chest Port 1 View  Result Date: 11/17/2017 CLINICAL DATA:  CHF (congestive heart failure) Hx HTN, former smoker. EXAM: PORTABLE CHEST 1 VIEW COMPARISON:  Chest x-rays dated 11/08/2017 and 11/07/2017. FINDINGS: Heart size and mediastinal contours are stable. Aortic atherosclerosis. Lungs are clear. No pleural effusion or pneumothorax seen. Osseous structures about the chest are unremarkable. IMPRESSION: No active disease.  No evidence of pneumonia or pulmonary edema. Aortic atherosclerosis. Electronically Signed   By: Franki Cabot M.D.   On: 11/17/2017 07:39    Scheduled Meds: . cycloSPORINE modified  50 mg Oral QHS  . cycloSPORINE modified  75 mg Oral Daily  . furosemide  80 mg Intravenous Q8H  . heparin injection (subcutaneous)  5,000 Units Subcutaneous Q8H  . hydrALAZINE  100 mg Oral Q8H  . predniSONE  5 mg Oral Q breakfast    Continuous Infusions:   LOS: 8 days     Alma Friendly, MD [facility]   If 7PM-7AM, please contact night-coverage [email or web address] Password [facility] 11/17/2017, 5:44 PM

## 2017-11-18 ENCOUNTER — Encounter (HOSPITAL_COMMUNITY): Payer: Self-pay

## 2017-11-18 LAB — RENAL FUNCTION PANEL
Albumin: 2.4 g/dL — ABNORMAL LOW (ref 3.5–5.0)
Anion gap: 12 (ref 5–15)
BUN: 81 mg/dL — ABNORMAL HIGH (ref 6–20)
CHLORIDE: 101 mmol/L (ref 101–111)
CO2: 24 mmol/L (ref 22–32)
Calcium: 8.6 mg/dL — ABNORMAL LOW (ref 8.9–10.3)
Creatinine, Ser: 4.7 mg/dL — ABNORMAL HIGH (ref 0.61–1.24)
GFR calc Af Amer: 13 mL/min — ABNORMAL LOW (ref >60)
GFR calc non Af Amer: 11 mL/min — ABNORMAL LOW (ref >60)
GLUCOSE: 99 mg/dL (ref 65–99)
PHOSPHORUS: 5.4 mg/dL — AB (ref 2.5–4.6)
POTASSIUM: 4.2 mmol/L (ref 3.5–5.1)
Sodium: 137 mmol/L (ref 135–145)

## 2017-11-18 LAB — CBC WITH DIFFERENTIAL/PLATELET
BASOS ABS: 0 10*3/uL (ref 0.0–0.1)
Basophils Relative: 0 %
Eosinophils Absolute: 0.1 10*3/uL (ref 0.0–0.7)
Eosinophils Relative: 2 %
HEMATOCRIT: 25.2 % — AB (ref 39.0–52.0)
Hemoglobin: 8.2 g/dL — ABNORMAL LOW (ref 13.0–17.0)
LYMPHS PCT: 15 %
Lymphs Abs: 0.9 10*3/uL (ref 0.7–4.0)
MCH: 30.3 pg (ref 26.0–34.0)
MCHC: 32.5 g/dL (ref 30.0–36.0)
MCV: 93 fL (ref 78.0–100.0)
MONO ABS: 0.4 10*3/uL (ref 0.1–1.0)
MONOS PCT: 6 %
NEUTROS ABS: 4.7 10*3/uL (ref 1.7–7.7)
Neutrophils Relative %: 77 %
Platelets: 295 10*3/uL (ref 150–400)
RBC: 2.71 MIL/uL — ABNORMAL LOW (ref 4.22–5.81)
RDW: 15.7 % — AB (ref 11.5–15.5)
WBC: 6.1 10*3/uL (ref 4.0–10.5)

## 2017-11-18 MED ORDER — FUROSEMIDE 10 MG/ML IJ SOLN: 80.0000 mg | Freq: Two times a day (BID) | INTRAMUSCULAR | Status: DC

## 2017-11-18 MED ORDER — FUROSEMIDE 10 MG/ML IJ SOLN
60.0000 mg | Freq: Two times a day (BID) | INTRAMUSCULAR | Status: DC
  Administered 2017-11-19: 60 mg via INTRAVENOUS
  Filled 2017-11-18: qty 6

## 2017-11-18 NOTE — Progress Notes (Signed)
[facility] Progress Note  Subjective: - 3.2 L yesterday on iv lasix, creat up slightly  Vitals:   11/17/17 2058 11/18/17 0500 11/18/17 0523 11/18/17 1416  BP: (!) 151/70  (!) 164/70 (!) 153/75  Pulse: 78  82 87  Resp: '18  18 20  ' Temp: 98.6 F (37 C)  98.2 F (36.8 C) 98.3 F (36.8 C)  TempSrc:   Oral Oral  SpO2: 96%  93% 95%  Weight:  74.8 kg (165 lb)    Height:        Inpatient medications: . cycloSPORINE modified  50 mg Oral QHS  . cycloSPORINE modified  75 mg Oral Daily  . [START ON 11/19/2017] furosemide  80 mg Intravenous Q12H  . heparin injection (subcutaneous)  5,000 Units Subcutaneous Q8H  . hydrALAZINE  100 mg Oral Q8H  . predniSONE  5 mg Oral Q breakfast    acetaminophen **OR** acetaminophen, benzonatate, hydrALAZINE, ondansetron **OR** ondansetron (ZOFRAN) IV  Exam: Gen alert chron ill no distress  +JVD Chest clear bilat RRR no MRG Abd soft ntnd no mass or ascites +bs Ext diffuse edema improved but still 1-2+ Neuro is alert, Ox 3 , nf no asterixis    date    Creat  Egfr/ notes  oct 2018   3.3 > 2.9 16 > 22/ aki episode  dec 2018   2.10  30     Nov 07, 2017 (admit)  2.54  23  may 24   3.57  may 30   4.40  12  jun 1    4.49  11    ua on admission 5/22 > 20-50 rbc/ 0-5 wbc 100 prot clear  cyclosporine level 115 ng/mL (100- 250)  CT abd 5/24 noncon > Impression: - Acute uncomplicated distal sigmoid diverticulitis with mild pericolonic inflammation.  - Marked atrophy of the native renal kidneys with simple and complex cysts as before. Stable appearing transplanted right iliac fossa kidney. - Stable AAA infrarenal    repeat ua 5/31 > 11-20 rbc/ 0-5 wbc, 7.0,  clear  urine sodium 5/31 - 116 (after lasix)  urine creat 5/31 - 25 (after lasix)  transplant renal US 5/31 > no hydro, +cortical thinning, RI upper limits to normal    Impression: 1 acute on ckd4 in renal transplant- bp's up and was volume overloaded, suspect hemodynamic vs  toxic. No other cause identified at this time. Transplant [place] normal. Abx now dc'd after 10d course.  CyA in range.  No indication for hd at this time. Will decrease IV lasix 60 q 12hr. Hopefully will recover function.    2 ckd stage IV transplant '97 - usual creat 2.1- 2.5 on cya/ pred followed by wfu 3 acute diverticulitis w pseudomonas bacteremia - improved, completed 10d course abx 4 hypertension - bp's up a bit, on hydral 75 mg tid 5 vol overload - improving  Plan - as above   Kelly Splinter MD [facility] pager [phone]   11/18/2017, 6:16 PM   Recent Labs  Lab 11/16/17 0543 11/17/17 0538 11/18/17 0509  NA 139 138 137  K 3.8 4.1 4.2  CL 104 104 101  CO2 '26 24 24  ' GLUCOSE 96 91 99  BUN 74* 76* 81*  CREATININE 4.43* 4.49* 4.70*  CALCIUM 8.4* 8.6* 8.6*  PHOS 4.9* 5.1* 5.4*   Recent Labs  Lab 11/16/17 0543 11/17/17 0538 11/18/17 0509  ALBUMIN 2.1* 2.3* 2.4*   Recent Labs  Lab 11/16/17 0543 11/17/17 3664 11/18/17 4034  WBC 5.9 5.5 6.1  NEUTROABS 4.4 4.1 4.7  HGB 8.4* 8.4* 8.2*  HCT 26.2* 26.4* 25.2*  MCV 94.6 94.3 93.0  PLT 282 286 295   Iron/TIBC/Ferritin/ %Sat    Component Value Date/Time   IRON 19 (L) 03/24/2017 1548   TIBC 223 (L) 03/24/2017 1548   FERRITIN 52 03/24/2017 1548   IRONPCTSAT 9 (L) 03/24/2017 1548

## 2017-11-18 NOTE — Progress Notes (Addendum)
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  CT scan of the abdomen showed evidence of diverticulitis.  Patient was subsequently found to have Pseudomonas bacteremia with possible ATN.  Patient admitted for further management.  Due to history of renal transplant, patient has been monitored closely and will be discharged home upon significant improvement of his renal function.  Today, patient denies any new complaints, eager to go home. Advised to elevate legs.   Assessment/Plan: Active Problems:   Acute kidney injury superimposed on CKD [facility]): Stage 4   Dehydration   HTN (hypertension)   Hyperlipidemia   Renal transplant, status post: 21 years ago: 1997   Iron deficiency anemia   Sepsis ([place])   Gastroenteritis   Sepsis due to Pseudomonas ([place])  Sepsis due to Pseudomonas bacteremia Resolved Currently afebrile, with no leukocytosis Etiology likely from ??  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 Cr rising  Likely due to sepsis Vs renal congestion from diastolic HF FENa consistent with intrinsic renal etiology Nephrology consulted: Vol overload, continue IV lasix, adjusted to 80 mg Q12H Daily BMP  Chronic diastolic HF BNP 811, BLE edema CXR no congestion ECHO with  EF 65-70%, Grade 1DD Continue IV lasix  History of renal transplant Continue cyclosporine, steroids Nephrology on board Monitor closely  Hypertension Stable Increased hydralazine to 100 mg every 8 hours Held home metoprolol due to persistent sinus bradycardia, plan to DC upon discharge  Iron deficiency anemia Stable   Code Status: DNR  Family Communication: None at bedside  Disposition Plan: Home once significant improvement of renal function/diuresis   Consultants:  Nephrology  Procedures:  None  Antimicrobials: Completed Levaquin  DVT prophylaxis: Heparin   Objective: Vitals:   11/17/17 2058 11/18/17 0500 11/18/17 0523 11/18/17 1416  BP: (!) 151/70  (!) 164/70 (!) 153/75  Pulse: 78  82 87  Resp: 18  18 20   Temp: 98.6 F (37 C)  98.2 F (36.8 C) 98.3 F (36.8 C)  TempSrc:   Oral Oral  SpO2: 96%  93% 95%  Weight:  74.8 kg (165 lb)    Height:        Intake/Output Summary (Last 24 hours) at 11/18/2017 1620 Last data filed at 11/18/2017 1400 Gross per 24 hour  Intake 660 ml  Output 3750 ml  Net -3090 ml   Filed Weights   11/16/17 0425 11/17/17 0500 11/18/17 0500  Weight: 81.2 kg (179 lb 0.2 oz) 78.1 kg (172 lb 2.9 oz) 74.8 kg (165 lb)    Exam:   General: NAD  Cardiovascular: S1, S2 present  Respiratory: CTAB  Abdomen: Soft, nontender, nondistended, bowel sounds present  Musculoskeletal: +2 bilateral pedal edema  Skin: Normal  Psychiatry: Normal mood   Data Reviewed: CBC: Recent Labs  Lab 11/12/17 0514 11/13/17 0529 11/16/17 0543 11/17/17 0538 11/18/17 0509  WBC 5.0 6.4 5.9 5.5 6.1  NEUTROABS  --   --  4.4 4.1 4.7  HGB 8.0* 9.0* 8.4* 8.4* 8.2*  HCT 23.6* 27.8* 26.2* 26.4* 25.2*  MCV 90.4 91.4 94.6 94.3 93.0  PLT 161 199 282 286 702   Basic Metabolic Panel: Recent Labs  Lab 11/14/17 0517 11/15/17 0536 11/16/17 0543 11/17/17 0538 11/18/17 0509  NA 134* 135 139 138 137  K 3.3* 3.6 3.8 4.1 4.2  CL 101 102 104 104 101    CO2 22 23 26 24 24   GLUCOSE 96 98 96 91 99  BUN 79* 78* 74* 76* 81*  CREATININE 4.17* 4.40* 4.43* 4.49* 4.70*  CALCIUM 8.4* 8.2* 8.4* 8.6* 8.6*  PHOS 4.2 4.9* 4.9* 5.1* 5.4*   GFR: Estimated Creatinine Clearance: 12.3 mL/min (A) (by C-G formula based on SCr of 4.7 mg/dL (H)). Liver Function Tests: Recent Labs  Lab 11/14/17 0517 11/15/17 0536 11/16/17 0543 11/17/17 0538 11/18/17 0509  ALBUMIN 2.1* 2.1* 2.1* 2.3* 2.4*   No results for input(s): LIPASE, AMYLASE in the last 168 hours. No results for input(s): AMMONIA in the last 168 hours. Coagulation Profile: No results for input(s): INR, PROTIME in the last 168 hours. Cardiac Enzymes: No results for input(s): CKTOTAL, CKMB, CKMBINDEX, TROPONINI in the last 168 hours. BNP (last 3 results) No results for input(s): PROBNP in the last 8760 hours. HbA1C: No results for input(s): HGBA1C in the last 72 hours. CBG: No results for input(s): GLUCAP in the last 168 hours. Lipid Profile: No results for input(s): CHOL, HDL, LDLCALC, TRIG, CHOLHDL, LDLDIRECT in the last 72 hours. Thyroid Function Tests: No results for input(s): TSH, T4TOTAL, FREET4, T3FREE, THYROIDAB in the last 72 hours. Anemia Panel: No results for input(s): VITAMINB12, FOLATE, FERRITIN, TIBC, IRON, RETICCTPCT in the last 72 hours. Urine analysis:    Component Value Date/Time   COLORURINE STRAW (A) 11/16/2017 1900   APPEARANCEUR CLEAR 11/16/2017 1900   LABSPEC 1.005 11/16/2017 1900   PHURINE 7.0 11/16/2017 1900   GLUCOSEU NEGATIVE 11/16/2017 1900   HGBUR MODERATE (A) 11/16/2017 1900   BILIRUBINUR NEGATIVE 11/16/2017 1900   KETONESUR NEGATIVE 11/16/2017 1900   PROTEINUR NEGATIVE 11/16/2017 1900   NITRITE NEGATIVE 11/16/2017 1900   LEUKOCYTESUR NEGATIVE 11/16/2017 1900   Sepsis Labs: @LABRCNTIP (procalcitonin:4,lacticidven:4)  ) Recent Results (from the past 240 hour(s))  Culture, blood (routine x 2)     Status: None   Collection Time: 11/09/17  7:53 AM   Result Value Ref Range Status   Specimen Description   Final    BLOOD RIGHT ANTECUBITAL Performed at [facility], [place] [address], [place], [place] [address]    Special Requests   Final    AEROBIC BOTTLE ONLY Blood Culture results may not be optimal due to an inadequate volume of blood received in culture bottles Performed at [facility] [address] [place], [place], [place] [address]    Culture   Final    NO GROWTH 5 DAYS Performed at [facility], [place] [address] [place], [place] [address]    Report Status 11/14/2017 FINAL  Final  Culture, blood (routine x 2)     Status: None   Collection Time: 11/09/17  7:53 AM  Result Value Ref Range Status   Specimen Description   Final    BLOOD RIGHT ANTECUBITAL Performed at [facility] [address] [place], [place] [address]    Special Requests   Final    BOTTLES DRAWN AEROBIC AND ANAEROBIC Blood Culture adequate volume Performed at [facility]  [address] [place], [place] [address]    Culture   Final    NO GROWTH 5 DAYS Performed at [facility], [place] [address], [place], [place] [address]    Report Status 11/14/2017 FINAL  Final      Studies: No results found.  Scheduled Meds: . cycloSPORINE modified  50 mg Oral QHS  . cycloSPORINE modified  75 mg Oral Daily  . [START ON 11/19/2017] furosemide  80 mg Intravenous Q12H  . heparin injection (subcutaneous)  5,000 Units Subcutaneous Q8H  . hydrALAZINE  100 mg Oral Q8H  . predniSONE  5 mg Oral Q breakfast    Continuous Infusions:   LOS: 9 days     Alma Friendly, MD [facility]   If 7PM-7AM, please contact night-coverage [email or web address] Password [email or web address] 11/18/2017, 4:20 PM

## 2017-11-19 ENCOUNTER — Encounter (HOSPITAL_COMMUNITY): Payer: Self-pay

## 2017-11-19 LAB — URINALYSIS, ROUTINE W REFLEX MICROSCOPIC
Bacteria, UA: NONE SEEN
Bilirubin Urine: NEGATIVE
GLUCOSE, UA: NEGATIVE mg/dL
Ketones, ur: NEGATIVE mg/dL
Leukocytes, UA: NEGATIVE
NITRITE: NEGATIVE
PH: 7 (ref 5.0–8.0)
Protein, ur: 30 mg/dL — AB
SPECIFIC GRAVITY, URINE: 1.006 (ref 1.005–1.030)

## 2017-11-19 LAB — BASIC METABOLIC PANEL
ANION GAP: 14 (ref 5–15)
BUN: 88 mg/dL — ABNORMAL HIGH (ref 6–20)
CHLORIDE: 98 mmol/L — AB (ref 101–111)
CO2: 24 mmol/L (ref 22–32)
Calcium: 8.7 mg/dL — ABNORMAL LOW (ref 8.9–10.3)
Creatinine, Ser: 4.99 mg/dL — ABNORMAL HIGH (ref 0.61–1.24)
GFR calc Af Amer: 12 mL/min — ABNORMAL LOW (ref >60)
GFR, EST NON AFRICAN AMERICAN: 10 mL/min — AB (ref >60)
GLUCOSE: 91 mg/dL (ref 65–99)
POTASSIUM: 4 mmol/L (ref 3.5–5.1)
Sodium: 136 mmol/L (ref 135–145)

## 2017-11-19 LAB — IRON AND TIBC
Iron: 74 ug/dL (ref 45–182)
SATURATION RATIOS: 32 % (ref 17.9–39.5)
TIBC: 232 ug/dL — AB (ref 250–450)
UIBC: 158 ug/dL

## 2017-11-19 LAB — MAGNESIUM: Magnesium: 1.7 mg/dL (ref 1.7–2.4)

## 2017-11-19 MED ORDER — HYDRALAZINE HCL 50 MG PO TABS
50.0000 mg | ORAL_TABLET | Freq: Three times a day (TID) | ORAL | Status: DC
  Administered 2017-11-19 – 2017-11-24 (×14): 50 mg via ORAL
  Filled 2017-11-19 (×15): qty 1

## 2017-11-19 MED ORDER — FUROSEMIDE 40 MG PO TABS: 80.0000 mg | ORAL_TABLET | Freq: Two times a day (BID) | ORAL | Status: DC

## 2017-11-19 NOTE — Progress Notes (Signed)
Per JC in pharmacy, ok to give Neoral once lab trough drawn because it takes a few days to get results.

## 2017-11-19 NOTE — Progress Notes (Signed)
Subjective: Interval History: has no complaint, feels better.  Objective: Vital signs in last 24 hours: Temp:  [98 F (36.7 C)-98.3 F (36.8 C)] 98.3 F (36.8 C) (06/03 0401) Pulse Rate:  [87-97] 97 (06/03 0401) Resp:  [18-20] 18 (06/03 0401) BP: (139-156)/(71-77) 139/77 (06/03 0401) SpO2:  [95 %-96 %] 96 % (06/03 0401) Weight:  [72.4 kg (159 lb 11.2 oz)-73 kg (160 lb 15 oz)] 73 kg (160 lb 15 oz) (06/03 0824) Weight change: -2.404 kg (-5 lb 4.8 oz)  Intake/Output from previous day: 06/02 0701 - 06/03 0700 In: 960 [P.O.:960] Out: 2975 [Urine:2975] Intake/Output this shift: Total I/O In: 120 [P.O.:120] Out: 425 [Urine:425]  General appearance: alert, cooperative, no distress and cushingoid Resp: diminished breath sounds bilaterally and rales bibasilar Cardio: S1, S2 normal and systolic murmur: systolic ejection 2/6, decrescendo at 2nd left intercostal space GI: pos bs, liver down 5 cm Extremities: edema 1+  Renal TX RLQ  Lab Results: Recent Labs    11/17/17 0538 11/18/17 0509  WBC 5.5 6.1  HGB 8.4* 8.2*  HCT 26.4* 25.2*  PLT 286 295   BMET:  Recent Labs    11/18/17 0509 11/19/17 0534  NA 137 136  K 4.2 4.0  CL 101 98*  CO2 24 24  GLUCOSE 99 91  BUN 81* 88*  CREATININE 4.70* 4.99*  CALCIUM 8.6* 8.7*   No results for input(s): PTH in the last 72 hours. Iron Studies: No results for input(s): IRON, TIBC, TRANSFERRIN, FERRITIN in the last 72 hours.  Studies/Results: No results found.  I have reviewed the patient's current medications.  Assessment/Plan: 1 Renal TX with AKI.  Suspect toxic from AB for divertic dz. Will recheck urine . Vol ok. Stop Lasix 2 Anemia eval 3 HPTH eval 4 Divertic dz 5 AAA 6 HTN fair control P stop Lasix, check Fe, PTH,     LOS: 10 days   Jeneen Rinks Arvin Abello 11/19/2017,11:09 AM

## 2017-11-19 NOTE — Progress Notes (Signed)
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  CT scan of the abdomen showed evidence of diverticulitis.  Patient was subsequently found to have Pseudomonas bacteremia with possible ATN.  Patient admitted for further management.  Due to history of renal transplant, patient has been monitored closely and will be discharged home upon significant improvement of his renal function.  Today, patient denies any new complaints. Daughter at bedside   Assessment/Plan: Active Problems:   Acute kidney injury superimposed on CKD [facility]): Stage 4   Dehydration   HTN (hypertension)   Hyperlipidemia   Renal transplant, status post: 21 years ago: 1997   Iron deficiency anemia   Sepsis ([place])   Gastroenteritis   Sepsis due to Pseudomonas ([place])  Sepsis due to Pseudomonas bacteremia Resolved Currently afebrile, with no leukocytosis Etiology likely from ??  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 Cr rising  Likely due to sepsis Vs renal congestion from diastolic HF Vs med-induced FENa consistent with intrinsic renal etiology Nephrology consulted: Vol overload, s/p IV lasix, will d/c Daily BMP  Chronic diastolic HF BNP 458, BLE edema CXR no congestion ECHO with EF 65-70%, Grade 1DD Stop  lasix 2/2 rising Cr  History of renal transplant Continue cyclosporine, steroids Cyclosporine level pending Nephrology on board Monitor closely  Hypertension Stable Hydralazine decreased to 50 mg every 8 hours Held home metoprolol due to persistent sinus bradycardia, plan to DC upon discharge  Anemia of CKD Stable   Code Status: DNR  Family Communication: None at bedside  Disposition Plan: Home once significant improvement of renal function   Consultants:  Nephrology  Procedures:  None  Antimicrobials: Completed Levaquin + Zosyn  DVT prophylaxis: Heparin   Objective: Vitals:   11/19/17 0400 11/19/17 0401 11/19/17 0824 11/19/17 1426  BP:  139/77  (!) 160/82  Pulse:  97  98  Resp:  18  18  Temp:  98.3 F (36.8 C)  98.3 F (36.8 C)  TempSrc:  Oral  Oral  SpO2:  96%  96%  Weight: 72.4 kg (159 lb 11.2 oz)  73 kg (160 lb 15 oz)   Height:        Intake/Output Summary (Last 24 hours) at 11/19/2017 1810 Last data filed at 11/19/2017 1428 Gross per 24 hour  Intake 360 ml  Output 2500 ml  Net -2140 ml   Filed Weights   11/18/17 0500 11/19/17 0400 11/19/17 0824  Weight: 74.8 kg (165 lb) 72.4 kg (159 lb 11.2 oz) 73 kg (160 lb 15 oz)    Exam:   General: NAD  Cardiovascular: S1, S2 present  Respiratory: CTAB  Abdomen: Soft, nontender, nondistended, bowel sounds present  Musculoskeletal: 1+ bilateral pedal edema  Skin: Normal  Psychiatry: Normal mood   Data Reviewed: CBC: Recent Labs  Lab 11/13/17 0529 11/16/17 0543 11/17/17 0538 11/18/17 0509  WBC 6.4 5.9 5.5 6.1  NEUTROABS  --  4.4  4.1 4.7  HGB 9.0* 8.4* 8.4* 8.2*  HCT 27.8* 26.2* 26.4* 25.2*  MCV 91.4 94.6 94.3 93.0  PLT 199 282 286 188   Basic Metabolic Panel: Recent Labs  Lab 11/14/17 0517 11/15/17 0536 11/16/17 0543 11/17/17 0538 11/18/17 0509 11/19/17 0534 11/19/17 1249  NA 134* 135 139 138 137 136  --   K 3.3* 3.6 3.8 4.1 4.2 4.0  --   CL 101 102 104 104 101 98*  --     CO2 22 23 26 24 24 24   --   GLUCOSE 96 98 96 91 99 91  --   BUN 79* 78* 74* 76* 81* 88*  --   CREATININE 4.17* 4.40* 4.43* 4.49* 4.70* 4.99*  --   CALCIUM 8.4* 8.2* 8.4* 8.6* 8.6* 8.7*  --   MG  --   --   --   --   --   --  1.7  PHOS 4.2 4.9* 4.9* 5.1* 5.4*  --   --    GFR: Estimated Creatinine Clearance: 11.6 mL/min (A) (by C-G formula based on SCr of 4.99 mg/dL (H)). Liver Function Tests: Recent Labs  Lab 11/14/17 0517 11/15/17 0536 11/16/17 0543 11/17/17 0538 11/18/17 0509  ALBUMIN 2.1* 2.1* 2.1* 2.3* 2.4*   No results for input(s): LIPASE, AMYLASE in the last 168 hours. No results for input(s): AMMONIA in the last 168 hours. Coagulation Profile: No results for input(s): INR, PROTIME in the last 168 hours. Cardiac Enzymes: No results for input(s): CKTOTAL, CKMB, CKMBINDEX, TROPONINI in the last 168 hours. BNP (last 3 results) No results for input(s): PROBNP in the last 8760 hours. HbA1C: No results for input(s): HGBA1C in the last 72 hours. CBG: No results for input(s): GLUCAP in the last 168 hours. Lipid Profile: No results for input(s): CHOL, HDL, LDLCALC, TRIG, CHOLHDL, LDLDIRECT in the last 72 hours. Thyroid Function Tests: No results for input(s): TSH, T4TOTAL, FREET4, T3FREE, THYROIDAB in the last 72 hours. Anemia Panel: Recent Labs    11/19/17 1249  TIBC 232*  IRON 74   Urine analysis:    Component Value Date/Time   COLORURINE STRAW (A) 11/19/2017 [place] 11/19/2017 1518   LABSPEC 1.006 11/19/2017 1518   PHURINE 7.0 11/19/2017 1518   GLUCOSEU NEGATIVE 11/19/2017 1518   HGBUR MODERATE (A) 11/19/2017 1518   BILIRUBINUR NEGATIVE 11/19/2017 1518   KETONESUR NEGATIVE 11/19/2017 1518   PROTEINUR 30 (A) 11/19/2017 1518   NITRITE NEGATIVE 11/19/2017 1518   LEUKOCYTESUR NEGATIVE 11/19/2017 1518   Sepsis Labs: @LABRCNTIP (procalcitonin:4,lacticidven:4)  ) No results found for this or any previous visit (from the past 240 hour(s)).     Studies: No results found.  Scheduled Meds: . cycloSPORINE modified  50 mg Oral QHS  . cycloSPORINE modified  75 mg Oral Daily  . heparin injection (subcutaneous)  5,000 Units Subcutaneous Q8H  . hydrALAZINE  50 mg Oral Q8H  . predniSONE  5 mg Oral Q breakfast    Continuous Infusions:   LOS: 10 days     Alma Friendly, MD [facility]   If 7PM-7AM, please contact night-coverage [email or web address] Password [email or web address] 11/19/2017, 6:10 PM

## 2017-11-20 LAB — PARATHYROID HORMONE, INTACT (NO CA): PTH: 105 pg/mL — AB (ref 15–65)

## 2017-11-20 LAB — RENAL FUNCTION PANEL
ALBUMIN: 2.6 g/dL — AB (ref 3.5–5.0)
Anion gap: 14 (ref 5–15)
BUN: 91 mg/dL — AB (ref 6–20)
CALCIUM: 8.6 mg/dL — AB (ref 8.9–10.3)
CO2: 25 mmol/L (ref 22–32)
CREATININE: 5.3 mg/dL — AB (ref 0.61–1.24)
Chloride: 101 mmol/L (ref 101–111)
GFR calc non Af Amer: 9 mL/min — ABNORMAL LOW (ref >60)
GFR, EST AFRICAN AMERICAN: 11 mL/min — AB (ref >60)
Glucose, Bld: 92 mg/dL (ref 65–99)
PHOSPHORUS: 6.2 mg/dL — AB (ref 2.5–4.6)
Potassium: 4.3 mmol/L (ref 3.5–5.1)
Sodium: 140 mmol/L (ref 135–145)

## 2017-11-20 LAB — MAGNESIUM: MAGNESIUM: 1.7 mg/dL (ref 1.7–2.4)

## 2017-11-20 NOTE — Progress Notes (Signed)
Subjective: Interval History: has no complaint just not much appetite.  Objective: Vital signs in last 24 hours: Temp:  [98.1 F (36.7 C)-98.3 F (36.8 C)] 98.1 F (36.7 C) (06/04 0543) Pulse Rate:  [84-98] 84 (06/04 0543) Resp:  [18-20] 20 (06/04 0543) BP: (139-160)/(67-82) 139/68 (06/04 0543) SpO2:  [93 %-96 %] 93 % (06/04 0543) Weight:  [71 kg (156 lb 8.4 oz)] 71 kg (156 lb 8.4 oz) (06/04 0500) Weight change: 0.56 kg (1 lb 3.8 oz)  Intake/Output from previous day: 06/03 0701 - 06/04 0700 In: 1080 [P.O.:1080] Out: 1845 [Urine:1845] Intake/Output this shift: Total I/O In: 120 [P.O.:120] Out: 375 [Urine:375]  General appearance: alert, cooperative and no distress Resp: clear to auscultation bilaterally Cardio: S1, S2 normal and systolic murmur: systolic ejection 2/6, decrescendo at 2nd left intercostal space GI: soft, tender suprapubic, Renal TX RLQ Extremities: edema 1+  Lab Results: Recent Labs    11/18/17 0509  WBC 6.1  HGB 8.2*  HCT 25.2*  PLT 295   BMET:  Recent Labs    11/19/17 0534 11/20/17 0524  NA 136 140  K 4.0 4.3  CL 98* 101  CO2 24 25  GLUCOSE 91 92  BUN 88* 91*  CREATININE 4.99* 5.30*  CALCIUM 8.7* 8.6*   Recent Labs    11/19/17 1249  PTH 105*   Iron Studies:  Recent Labs    11/19/17 1249  IRON 74  TIBC 232*    Studies/Results: No results found.  I have reviewed the patient's current medications.  Assessment/Plan: 1 CKD 4 /AKI AKI most likely from ARB and acute illness vs drugs used for Diverticulitis.  May and may not recover. Cyclo ok. Nonolig. 2 Anemia stable 3 HTN improving 4 Renal Tx 5 Divertic dz P foley trial, discussed. If worsens in next 2-3 d, move to [facility] for HD.  He and daughter counseled. 40 min spent   LOS: 11 days   Tambria Pfannenstiel 11/20/2017,11:18 AM

## 2017-11-20 NOTE — Progress Notes (Signed)
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  CT scan of the abdomen showed evidence of diverticulitis.  Patient was subsequently found to have Pseudomonas bacteremia with possible ATN.  Patient admitted for further management.  Due to history of renal transplant, patient has been monitored closely and will be discharged home upon significant improvement of his renal function.  Today, patient denies any new complaints, reports poor appetite. Daughter at bedside, eager to speak with nephrologist.   Assessment/Plan: Active Problems:   Acute kidney injury superimposed on CKD [facility]): Stage 4   Dehydration   HTN (hypertension)   Hyperlipidemia   Renal transplant, status post: 21 years ago: 1997   Iron deficiency anemia   Sepsis ([place])   Gastroenteritis   Sepsis due to Pseudomonas ([place])  Sepsis due to Pseudomonas bacteremia Resolved Currently afebrile, with no leukocytosis Etiology likely from ??  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 Placed foley for ?retension. Plan for HD if  Cr continues to rise in the next few days Daily BMP  Chronic diastolic HF BNP 782, BLE edema resolved s/p lasix CXR no congestion ECHO with EF 65-70%, Grade 1DD D/C lasix  History of renal transplant Continue cyclosporine, steroids Cyclosporine level pending Nephrology on board Monitor closely  Hypertension Stable Hydralazine 50 mg every 8 hours  Held home metoprolol due to persistent sinus bradycardia, plan to DC upon discharge  Anemia of CKD Stable   Code Status: DNR  Family Communication: Daughter at bedside  Disposition Plan: Home once significant improvement of renal function   Consultants:  Nephrology  Procedures:  None  Antimicrobials: Completed Levaquin + Zosyn  DVT prophylaxis: Heparin   Objective: Vitals:   11/19/17 2147 11/20/17 0500 11/20/17 0543 11/20/17 1550  BP: (!) 154/67  139/68 (!) 150/74  Pulse: 97  84   Resp: 18  20   Temp: 98.1 F (36.7 C)  98.1 F (36.7 C)   TempSrc: Oral  Oral   SpO2: 93%  93%   Weight:  71 kg (156 lb 8.4 oz)    Height:        Intake/Output Summary (Last 24 hours) at 11/20/2017 1654 Last data filed at 11/20/2017 1230 Gross per 24 hour  Intake 1080 ml  Output 1295 ml  Net -215 ml   Filed Weights   11/19/17 0400 11/19/17 0824 11/20/17 0500  Weight: 72.4 kg (159 lb 11.2 oz) 73 kg (160 lb 15 oz) 71 kg (156 lb 8.4 oz)    Exam:   General: NAD  Cardiovascular: S1, S2 present  Respiratory: CTAB  Abdomen: Soft, nontender, nondistended, bowel sounds present  Musculoskeletal: Trace bilateral pedal edema  Skin: Normal  Psychiatry: Normal mood   Data Reviewed: CBC: Recent Labs  Lab 11/16/17 0543 11/17/17 0538 11/18/17 0509  WBC 5.9 5.5 6.1  NEUTROABS 4.4 4.1 4.7  HGB 8.4* 8.4* 8.2*  HCT 26.2* 26.4* 25.2*  MCV 94.6 94.3 93.0  PLT 282 286 099   Basic Metabolic Panel: Recent Labs  Lab 11/15/17 0536 11/16/17 0543 11/17/17 0538 11/18/17 0509 11/19/17 0534 11/19/17 1249 11/20/17 0524  11/20/17 1128  NA 135 139 138 137 136  --  140  --   K 3.6 3.8 4.1 4.2 4.0  --  4.3  --   CL 102 104 104 101 98*  --  101  --   CO2 23 26 24 24 24   --  25  --   GLUCOSE 98 96 91 99 91  --  92  --   BUN 78* 74* 76* 81* 88*  --  91*  --   CREATININE 4.40* 4.43* 4.49* 4.70* 4.99*  --  5.30*  --   CALCIUM 8.2* 8.4* 8.6* 8.6* 8.7*  --  8.6*  --   MG  --   --   --   --   --  1.7  --  1.7  PHOS 4.9* 4.9* 5.1* 5.4*  --   --  6.2*  --    GFR: Estimated Creatinine Clearance: 10.9 mL/min (A) (by C-G formula based on SCr of 5.3 mg/dL (H)). Liver Function Tests: Recent Labs  Lab 11/15/17 0536 11/16/17 0543 11/17/17 0538 11/18/17 0509 11/20/17 0524  ALBUMIN 2.1* 2.1* 2.3* 2.4* 2.6*   No results for input(s): LIPASE, AMYLASE in the last 168 hours. No results for input(s): AMMONIA in the last 168 hours. Coagulation Profile: No results for input(s): INR, PROTIME in the last 168 hours. Cardiac Enzymes: No results for input(s): CKTOTAL, CKMB, CKMBINDEX, TROPONINI in the last 168 hours. BNP (last 3 results) No results for input(s): PROBNP in the last 8760 hours. HbA1C: No results for input(s): HGBA1C in the last 72 hours. CBG: No results for input(s): GLUCAP in the last 168 hours. Lipid Profile: No results for input(s): CHOL, HDL, LDLCALC, TRIG, CHOLHDL, LDLDIRECT in the last 72 hours. Thyroid Function Tests: No results for input(s): TSH, T4TOTAL, FREET4, T3FREE, THYROIDAB in the last 72 hours. Anemia Panel: Recent Labs    11/19/17 1249  TIBC 232*  IRON 74   Urine analysis:    Component Value Date/Time   COLORURINE STRAW (A) 11/19/2017 [place] 11/19/2017 1518   LABSPEC 1.006 11/19/2017 1518   PHURINE 7.0 11/19/2017 1518   GLUCOSEU NEGATIVE 11/19/2017 1518   HGBUR MODERATE (A) 11/19/2017 1518   BILIRUBINUR NEGATIVE 11/19/2017 1518   KETONESUR NEGATIVE 11/19/2017 1518   PROTEINUR 30 (A) 11/19/2017 1518   NITRITE NEGATIVE 11/19/2017 1518   LEUKOCYTESUR NEGATIVE  11/19/2017 1518   Sepsis Labs: @LABRCNTIP (procalcitonin:4,lacticidven:4)  ) No results found for this or any previous visit (from the past 240 hour(s)).    Studies: No results found.  Scheduled Meds: . cycloSPORINE modified  50 mg Oral QHS  . cycloSPORINE modified  75 mg Oral Daily  . heparin injection (subcutaneous)  5,000 Units Subcutaneous Q8H  . hydrALAZINE  50 mg Oral Q8H  . predniSONE  5 mg Oral Q breakfast    Continuous Infusions:   LOS: 11 days     [place] Friendly, MD [facility]   If 7PM-7AM, please contact night-coverage [email or web address] Password [facility] 11/20/2017, 4:54 PM

## 2017-11-20 NOTE — Progress Notes (Signed)
PT Cancellation Note  Patient Details Name: Caleb Hicks MRN: [id number] DOB: Mar 02, 1941   Cancelled Treatment:    Reason Eval/Treat Not Completed: Other (comment)(pt reported he's been walking in the halls with a RW without difficulty (RN confirmed this), he doesn't feel he needs PT at present, will sign off. )   Philomena Doheny 11/20/2017, 10:44 AM [phone]

## 2017-11-21 LAB — RENAL FUNCTION PANEL
Albumin: 2.8 g/dL — ABNORMAL LOW (ref 3.5–5.0)
Anion gap: 11 (ref 5–15)
BUN: 87 mg/dL — ABNORMAL HIGH (ref 6–20)
CALCIUM: 8.8 mg/dL — AB (ref 8.9–10.3)
CHLORIDE: 105 mmol/L (ref 101–111)
CO2: 24 mmol/L (ref 22–32)
CREATININE: 4.98 mg/dL — AB (ref 0.61–1.24)
GFR calc Af Amer: 12 mL/min — ABNORMAL LOW (ref >60)
GFR, EST NON AFRICAN AMERICAN: 10 mL/min — AB (ref >60)
Glucose, Bld: 91 mg/dL (ref 65–99)
Phosphorus: 6 mg/dL — ABNORMAL HIGH (ref 2.5–4.6)
Potassium: 4.4 mmol/L (ref 3.5–5.1)
SODIUM: 140 mmol/L (ref 135–145)

## 2017-11-21 LAB — CBC WITH DIFFERENTIAL/PLATELET
BASOS ABS: 0 10*3/uL (ref 0.0–0.1)
Basophils Relative: 0 %
EOS ABS: 0.1 10*3/uL (ref 0.0–0.7)
EOS PCT: 2 %
HCT: 26.5 % — ABNORMAL LOW (ref 39.0–52.0)
Hemoglobin: 8.6 g/dL — ABNORMAL LOW (ref 13.0–17.0)
LYMPHS PCT: 17 %
Lymphs Abs: 1 10*3/uL (ref 0.7–4.0)
MCH: 30.4 pg (ref 26.0–34.0)
MCHC: 32.5 g/dL (ref 30.0–36.0)
MCV: 93.6 fL (ref 78.0–100.0)
MONO ABS: 0.5 10*3/uL (ref 0.1–1.0)
Monocytes Relative: 8 %
Neutro Abs: 4.2 10*3/uL (ref 1.7–7.7)
Neutrophils Relative %: 73 %
PLATELETS: 300 10*3/uL (ref 150–400)
RBC: 2.83 MIL/uL — ABNORMAL LOW (ref 4.22–5.81)
RDW: 15.7 % — ABNORMAL HIGH (ref 11.5–15.5)
WBC: 5.7 10*3/uL (ref 4.0–10.5)

## 2017-11-21 LAB — HCV COMMENT:

## 2017-11-21 LAB — HEPATITIS B SURFACE ANTIGEN: HEP B S AG: NEGATIVE

## 2017-11-21 LAB — HEPATITIS B SURFACE ANTIBODY,QUALITATIVE: HEP B S AB: NONREACTIVE

## 2017-11-21 LAB — HEPATITIS B CORE ANTIBODY, IGM: HEP B C IGM: NEGATIVE

## 2017-11-21 LAB — HEPATITIS C ANTIBODY (REFLEX)

## 2017-11-21 MED ORDER — CALCITRIOL 0.25 MCG PO CAPS
0.2500 ug | ORAL_CAPSULE | Freq: Every day | ORAL | Status: DC
  Administered 2017-11-21 – 2017-11-24 (×4): 0.25 ug via ORAL
  Filled 2017-11-21 (×4): qty 1

## 2017-11-21 MED ORDER — SORBITOL 70 % SOLN
15.0000 mL | Freq: Every day | Status: DC | PRN
  Administered 2017-11-21: 15 mL via ORAL
  Filled 2017-11-21: qty 30

## 2017-11-21 NOTE — Progress Notes (Signed)
Subjective: Interval History: has no complaint.  Objective: Vital signs in last 24 hours: Temp:  [98.4 F (36.9 C)] 98.4 F (36.9 C) (06/05 0536) Pulse Rate:  [81-85] 81 (06/05 0536) Resp:  [18] 18 (06/05 0536) BP: (150-154)/(69-77) 151/69 (06/05 0536) SpO2:  [96 %-99 %] 96 % (06/05 0536) Weight:  [70.2 kg (154 lb 12.2 oz)] 70.2 kg (154 lb 12.2 oz) (06/05 0539) Weight change: -2.8 kg (-6 lb 2.8 oz)  Intake/Output from previous day: 06/04 0701 - 06/05 0700 In: 720 [P.O.:720] Out: 1425 [Urine:1425] Intake/Output this shift: Total I/O In: 240 [P.O.:240] Out: -   General appearance: alert, cooperative and no distress Resp: clear to auscultation bilaterally Cardio: S1, S2 normal and systolic murmur: systolic ejection 2/6, decrescendo at 2nd left intercostal space GI: soft, nontender, pos bs, Renal TX RLQ Extremities: edema 1+  Lab Results: Recent Labs    11/21/17 0452  WBC 5.7  HGB 8.6*  HCT 26.5*  PLT 300   BMET:  Recent Labs    11/20/17 0524 11/21/17 0452  NA 140 140  K 4.3 4.4  CL 101 105  CO2 25 24  GLUCOSE 92 91  BUN 91* 87*  CREATININE 5.30* 4.98*  CALCIUM 8.6* 8.8*   Recent Labs    11/19/17 1249  PTH 105*   Iron Studies:  Recent Labs    11/19/17 1249  IRON 74  TIBC 232*    Studies/Results: No results found.  I have reviewed the patient's current medications.  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 ? Foley vs recovery of ATN/AIN with infection (diveritic) 2 anemia 3 HPTH use vit D 4 hTN controlled 5 Diverticulitis P cont foley,follow chem    LOS: 12 days   Inmer Nix 11/21/2017,10:25 AM

## 2017-11-21 NOTE — Progress Notes (Signed)
PROGRESS NOTE  Caleb Hicks  ION:[id number] DOB: 1940-10-29 DOA: 11/07/2017 PCP: Patient, No Pcp Per   Brief Narrative: Caleb Hicks is a 77 year old male with a history of IgA nephropathy s/p renal transplant and now stage III CKD, HTN, HLD, AAA, admitted on 11/07/2017 with complaints of generalized weakness, abdominal discomfort, nausea/diarrhea, found to have viral gastroenteritis with secondary adrenal insufficiency.  CT scan of the abdomen showed evidence of diverticulitis.  Patient was subsequently found to have Pseudomonas bacteremia. Zosyn was continued and levaquin used to complete 10 days of therapy. Hospitalization has been complicated by progressive renal failure, suspected due to ATN from sepsis, possibly also due to antibiotics. Nephrology was consulted and renal function has been monitored closely for recovery. If worsens, may need dialysis.  Assessment & Plan: Active Problems:   Acute kidney injury superimposed on CKD [facility]): Stage 4   Dehydration   HTN (hypertension)   Hyperlipidemia   Renal transplant, status post: 21 years ago: 1997   Iron deficiency anemia   Sepsis ([place])   Gastroenteritis   Sepsis due to Pseudomonas ([place])  Sepsis due to Pseudomonas bacteremia: Resolved. Possibly related to sigmoid diverticulitis.  - +Blood cultures 5/22 with repeat on 5/24 negative. Completed 12 days of antibiotics, initially vanc x1, and zosyn continued, transitioned to renally-dosed levaquin final dose on 6/1.   Sigmoid diverticulitis: CT abdomen showed mild sigmoid diverticulitis without any perforation - Symptoms resolved, continue diet (renal)  AKI on CKD stage IV in renal transplant patient: Rise of creatinine has stalled, hopeful for recovery. Non oliguric. FENa consistent with intrarenal pathology/ATN. Renal U/S unremarkable.  - Continue foley and daily monitoring.  - Measure UOP.   Chronic diastolic HF: BNP 244, BLE edema resolved s/p lasix. ECHO with EF 65-70%, Grade 1DD -  Strict I/O, daily weights.  - Hold diuretic  History of renal transplant: No perirenal edema/evidence of rejection. - Continue cyclosporine, steroids  Hypertension:  - Hydralazine 50 mg every 8 hours  - Held home metoprolol due to persistent sinus bradycardia. May need to restart if BP remains elevated.  Anemia of CKD: Stable - Monitor  DVT prophylaxis: Heparin Code Status: DNR Family Communication: None at bedside on bedside rounds this AM.  Disposition Plan: Home once renal function improved. May alternatively require transfer to [facility] for HD if creatinine worsens.   Consultants:   Nephrology  Procedures:   None  Antimicrobials:  Vancomycin 5/22  Zosyn 5/22 > levaquin through 6/1  Subjective: No complaints. Denies any pain, chest pain. Making urine in foley. Eating but still has poor appetite. No abdominal pain, N/V/D. Having some itching of bilateral upper extremities without rash.  Objective: Vitals:   11/20/17 1550 11/20/17 2200 11/21/17 0536 11/21/17 0539  BP: (!) 150/74 (!) 154/77 (!) 151/69   Pulse:  85 81   Resp:   18   Temp:  98.4 F (36.9 C) 98.4 F (36.9 C)   TempSrc:  Oral Oral   SpO2:  99% 96%   Weight:    70.2 kg (154 lb 12.2 oz)  Height:        Intake/Output Summary (Last 24 hours) at 11/21/2017 1220 Last data filed at 11/21/2017 1153 Gross per 24 hour  Intake 840 ml  Output 1475 ml  Net -635 ml   Filed Weights   11/19/17 0824 11/20/17 0500 11/21/17 0539  Weight: 73 kg (160 lb 15 oz) 71 kg (156 lb 8.4 oz) 70.2 kg (154 lb 12.2 oz)    Gen: 77 y.o. male  in no distress  Pulm: Non-labored breathing room air. Clear to auscultation bilaterally.  CV: Regular rate and rhythm. II/VI SEM at LSB GI: Abdomen soft, non-tender, non-distended, +BS, scar from renal xplant in RLQ Ext: 1+ pitting LE edema Skin: No rashes, lesions stasis LE changes. No other ulcers Neuro: Alert and oriented. No focal neurological deficits. Psych: Judgement and insight  appear normal. Mood & affect appropriate.   Data Reviewed: I have personally reviewed following labs and imaging studies  CBC: Recent Labs  Lab 11/16/17 0543 11/17/17 0538 11/18/17 0509 11/21/17 0452  WBC 5.9 5.5 6.1 5.7  NEUTROABS 4.4 4.1 4.7 4.2  HGB 8.4* 8.4* 8.2* 8.6*  HCT 26.2* 26.4* 25.2* 26.5*  MCV 94.6 94.3 93.0 93.6  PLT 282 286 295 956   Basic Metabolic Panel: Recent Labs  Lab 11/16/17 0543 11/17/17 0538 11/18/17 0509 11/19/17 0534 11/19/17 1249 11/20/17 0524 11/20/17 1128 11/21/17 0452  NA 139 138 137 136  --  140  --  140  K 3.8 4.1 4.2 4.0  --  4.3  --  4.4  CL 104 104 101 98*  --  101  --  105  CO2 26 24 24 24   --  25  --  24  GLUCOSE 96 91 99 91  --  92  --  91  BUN 74* 76* 81* 88*  --  91*  --  87*  CREATININE 4.43* 4.49* 4.70* 4.99*  --  5.30*  --  4.98*  CALCIUM 8.4* 8.6* 8.6* 8.7*  --  8.6*  --  8.8*  MG  --   --   --   --  1.7  --  1.7  --   PHOS 4.9* 5.1* 5.4*  --   --  6.2*  --  6.0*   GFR: Estimated Creatinine Clearance: 11.6 mL/min (A) (by C-G formula based on SCr of 4.98 mg/dL (H)). Liver Function Tests: Recent Labs  Lab 11/16/17 0543 11/17/17 0538 11/18/17 0509 11/20/17 0524 11/21/17 0452  ALBUMIN 2.1* 2.3* 2.4* 2.6* 2.8*   No results for input(s): LIPASE, AMYLASE in the last 168 hours. No results for input(s): AMMONIA in the last 168 hours. Coagulation Profile: No results for input(s): INR, PROTIME in the last 168 hours. Cardiac Enzymes: No results for input(s): CKTOTAL, CKMB, CKMBINDEX, TROPONINI in the last 168 hours. BNP (last 3 results) No results for input(s): PROBNP in the last 8760 hours. HbA1C: No results for input(s): HGBA1C in the last 72 hours. CBG: No results for input(s): GLUCAP in the last 168 hours. Lipid Profile: No results for input(s): CHOL, HDL, LDLCALC, TRIG, CHOLHDL, LDLDIRECT in the last 72 hours. Thyroid Function Tests: No results for input(s): TSH, T4TOTAL, FREET4, T3FREE, THYROIDAB in the last 72  hours. Anemia Panel: Recent Labs    11/19/17 1249  TIBC 232*  IRON 74   Urine analysis:    Component Value Date/Time   COLORURINE STRAW (A) 11/19/2017 [place] 11/19/2017 1518   LABSPEC 1.006 11/19/2017 1518   PHURINE 7.0 11/19/2017 1518   GLUCOSEU NEGATIVE 11/19/2017 1518   HGBUR MODERATE (A) 11/19/2017 1518   BILIRUBINUR NEGATIVE 11/19/2017 1518   KETONESUR NEGATIVE 11/19/2017 1518   PROTEINUR 30 (A) 11/19/2017 1518   NITRITE NEGATIVE 11/19/2017 1518   LEUKOCYTESUR NEGATIVE 11/19/2017 1518   No results found for this or any previous visit (from the past 240 hour(s)).    Radiology Studies: No results found.  Scheduled Meds: . calcitRIOL  0.25 mcg Oral Daily  .  cycloSPORINE modified  50 mg Oral QHS  . cycloSPORINE modified  75 mg Oral Daily  . heparin injection (subcutaneous)  5,000 Units Subcutaneous Q8H  . hydrALAZINE  50 mg Oral Q8H  . predniSONE  5 mg Oral Q breakfast   Continuous Infusions:   LOS: 12 days   Time spent: 25 minutes.  Patrecia Pour, MD [facility] [email or web address] Password [email or web address] 11/21/2017, 12:20 PM

## 2017-11-22 LAB — RENAL FUNCTION PANEL
Albumin: 2.7 g/dL — ABNORMAL LOW (ref 3.5–5.0)
Anion gap: 11 (ref 5–15)
BUN: 81 mg/dL — ABNORMAL HIGH (ref 6–20)
CHLORIDE: 105 mmol/L (ref 101–111)
CO2: 24 mmol/L (ref 22–32)
CREATININE: 4.79 mg/dL — AB (ref 0.61–1.24)
Calcium: 9 mg/dL (ref 8.9–10.3)
GFR calc non Af Amer: 11 mL/min — ABNORMAL LOW (ref >60)
GFR, EST AFRICAN AMERICAN: 12 mL/min — AB (ref >60)
Glucose, Bld: 93 mg/dL (ref 65–99)
Phosphorus: 5.7 mg/dL — ABNORMAL HIGH (ref 2.5–4.6)
Potassium: 4.7 mmol/L (ref 3.5–5.1)
Sodium: 140 mmol/L (ref 135–145)

## 2017-11-22 MED ORDER — METOPROLOL TARTRATE 25 MG PO TABS
25.0000 mg | ORAL_TABLET | Freq: Two times a day (BID) | ORAL | Status: DC
  Administered 2017-11-22 – 2017-11-24 (×5): 25 mg via ORAL
  Filled 2017-11-22 (×5): qty 1

## 2017-11-22 NOTE — Progress Notes (Signed)
Subjective: Interval History: has no complaint , doing better.  Objective: Vital signs in last 24 hours: Temp:  [98.1 F (36.7 C)-98.3 F (36.8 C)] 98.1 F (36.7 C) (06/06 0500) Pulse Rate:  [79-88] 79 (06/06 0500) Resp:  [20-21] 20 (06/06 0500) BP: (142-166)/(73-78) 166/78 (06/06 0500) SpO2:  [93 %-96 %] 96 % (06/06 0500) Weight:  [70.1 kg (154 lb 8.7 oz)] 70.1 kg (154 lb 8.7 oz) (06/06 0501) Weight change: -0.1 kg (-3.5 oz)  Intake/Output from previous day: 06/05 0701 - 06/06 0700 In: 900 [P.O.:900] Out: 1275 [Urine:1275] Intake/Output this shift: Total I/O In: 200 [P.O.:200] Out: -   General appearance: alert, cooperative and cushingoid Resp: clear to auscultation bilaterally Cardio: S1, S2 normal and systolic murmur: systolic ejection 2/6, decrescendo at 2nd left intercostal space GI: pos bs, soft, Renal Tx RLQ Extremities: edema 2+  Lab Results: Recent Labs    11/21/17 0452  WBC 5.7  HGB 8.6*  HCT 26.5*  PLT 300   BMET:  Recent Labs    11/21/17 0452 11/22/17 0507  NA 140 140  K 4.4 4.7  CL 105 105  CO2 24 24  GLUCOSE 91 93  BUN 87* 81*  CREATININE 4.98* 4.79*  CALCIUM 8.8* 9.0   Recent Labs    11/19/17 1249  PTH 105*   Iron Studies:  Recent Labs    11/19/17 1249  IRON 74  TIBC 232*    Studies/Results: No results found.  I have reviewed the patient's current medications.  Assessment/Plan: 1 Renal Tx with CKD 4 and AKI now slowly better, ? Obstruction vs AIN vs ATN . Will try without foley 2 HTN diuresing 3 Anemia  4 Divetic dz 5 HPTH vit D P d/c foley, follow Cr , if better d/c if not Urology    LOS: 13 days   Jonia Oakey 11/22/2017,10:24 AM

## 2017-11-22 NOTE — Progress Notes (Signed)
PROGRESS NOTE  Caleb Hicks  JKK:[id number] DOB: 08-17-1940 DOA: 11/07/2017 PCP: Patient, No Pcp Per   Brief Narrative: Caleb Hicks is a 77 year old male with a history of IgA nephropathy s/p renal transplant and now stage III CKD, HTN, HLD, AAA, admitted on 11/07/2017 with complaints of generalized weakness, abdominal discomfort, nausea/diarrhea, found to have viral gastroenteritis with secondary adrenal insufficiency.  CT scan of the abdomen showed evidence of diverticulitis.  Patient was subsequently found to have Pseudomonas bacteremia. Zosyn was continued and levaquin used to complete 10 days of therapy. Hospitalization has been complicated by progressive renal failure, suspected due to ATN from sepsis, possibly also due to antibiotics. Nephrology was consulted and renal function has been monitored closely for recovery. If worsens, may need dialysis.  Assessment & Plan: Active Problems:   Acute kidney injury superimposed on CKD [facility]): Stage 4   Dehydration   HTN (hypertension)   Hyperlipidemia   Renal transplant, status post: 21 years ago: 1997   Iron deficiency anemia   Sepsis ([place])   Gastroenteritis   Sepsis due to Pseudomonas ([place])  Sepsis due to Pseudomonas bacteremia: Resolved. Possibly related to sigmoid diverticulitis.  - +Blood cultures 5/22 with repeat on 5/24 negative. Completed 12 days of antibiotics, initially vanc x1, and zosyn continued, transitioned to renally-dosed levaquin final dose on 6/1.   Sigmoid diverticulitis: CT abdomen showed mild sigmoid diverticulitis without any perforation - Symptoms resolved, continue diet (renal)  AKI on CKD stage IV in renal transplant patient: Rise of creatinine has stalled, hopeful for recovery. Non oliguric. FENa consistent with intrarenal pathology/ATN. Renal U/S unremarkable.  - Continued slow improvement w/CrCl 10 > 11 > 12 day over day since starting foley. Will DC foley and monitor. If reworsens/UOP diminishes, may need  urology evaluation. - Measure UOP.   Chronic diastolic HF: BNP 716, BLE edema resolved s/p lasix. ECHO with EF 65-70%, Grade 1DD. Appears euvolemic. - Strict I/O, daily weights.  - Hold diuretic  History of renal transplant: No perirenal edema/evidence of rejection. - Continue cyclosporine, steroids  Hypertension:  - Hydralazine 50 mg every 8 hours  - Held home metoprolol due to persistent sinus bradycardia. BP elevated, HR >70 consistently, will restart at 25mg  po BID (titrate to home 50 BID as able)  Anemia of CKD: Stable - Monitor  DVT prophylaxis: Heparin Code Status: DNR Family Communication: None at bedside on bedside rounds this AM.  Disposition Plan: Home once renal function improved. May need urology evaluation if does not continue improvement now that foley discontinued.  Consultants:   Nephrology  Procedures:   None  Antimicrobials:  Vancomycin 5/22  Zosyn 5/22 > levaquin through 6/1  Subjective: No complaints. Itching is better. No pain, N/V/D. UOP has been good. Taking po.  Objective: Vitals:   11/21/17 1259 11/21/17 2104 11/22/17 0500 11/22/17 0501  BP: (!) 142/73 (!) 158/77 (!) 166/78   Pulse: 88 79 79   Resp: (!) 21 20 20    Temp: 98.1 F (36.7 C) 98.3 F (36.8 C) 98.1 F (36.7 C)   TempSrc: Oral Oral Oral   SpO2: 93% 96% 96%   Weight:    70.1 kg (154 lb 8.7 oz)  Height:        Intake/Output Summary (Last 24 hours) at 11/22/2017 1126 Last data filed at 11/22/2017 0900 Gross per 24 hour  Intake 860 ml  Output 1275 ml  Net -415 ml   Filed Weights   11/20/17 0500 11/21/17 0539 11/22/17 0501  Weight: 71 kg (156  lb 8.4 oz) 70.2 kg (154 lb 12.2 oz) 70.1 kg (154 lb 8.7 oz)    Gen: 77 y.o. male in no distress sitting in bedside chair Pulm: Non-labored breathing room air. Clear to auscultation bilaterally.  CV: Regular rate and rhythm. II/VI SEM at LSB GI: Abdomen soft, non-tender, non-distended, +BS, scar from renal xplant in RLQ Ext:  Trace pitting LE edema Skin: No rashes, lesions stasis LE changes. No other ulcers Neuro: Alert and oriented. No focal neurological deficits. No asterixis. Psych: Judgement and insight appear normal. Mood & affect appropriate.   Data Reviewed: I have personally reviewed following labs and imaging studies  CBC: Recent Labs  Lab 11/16/17 0543 11/17/17 0538 11/18/17 0509 11/21/17 0452  WBC 5.9 5.5 6.1 5.7  NEUTROABS 4.4 4.1 4.7 4.2  HGB 8.4* 8.4* 8.2* 8.6*  HCT 26.2* 26.4* 25.2* 26.5*  MCV 94.6 94.3 93.0 93.6  PLT 282 286 295 102   Basic Metabolic Panel: Recent Labs  Lab 11/17/17 0538 11/18/17 0509 11/19/17 0534 11/19/17 1249 11/20/17 0524 11/20/17 1128 11/21/17 0452 11/22/17 0507  NA 138 137 136  --  140  --  140 140  K 4.1 4.2 4.0  --  4.3  --  4.4 4.7  CL 104 101 98*  --  101  --  105 105  CO2 24 24 24   --  25  --  24 24  GLUCOSE 91 99 91  --  92  --  91 93  BUN 76* 81* 88*  --  91*  --  87* 81*  CREATININE 4.49* 4.70* 4.99*  --  5.30*  --  4.98* 4.79*  CALCIUM 8.6* 8.6* 8.7*  --  8.6*  --  8.8* 9.0  MG  --   --   --  1.7  --  1.7  --   --   PHOS 5.1* 5.4*  --   --  6.2*  --  6.0* 5.7*   GFR: Estimated Creatinine Clearance: 12.1 mL/min (A) (by C-G formula based on SCr of 4.79 mg/dL (H)). Liver Function Tests: Recent Labs  Lab 11/17/17 0538 11/18/17 0509 11/20/17 0524 11/21/17 0452 11/22/17 0507  ALBUMIN 2.3* 2.4* 2.6* 2.8* 2.7*   No results for input(s): LIPASE, AMYLASE in the last 168 hours. No results for input(s): AMMONIA in the last 168 hours. Coagulation Profile: No results for input(s): INR, PROTIME in the last 168 hours. Cardiac Enzymes: No results for input(s): CKTOTAL, CKMB, CKMBINDEX, TROPONINI in the last 168 hours. BNP (last 3 results) No results for input(s): PROBNP in the last 8760 hours. HbA1C: No results for input(s): HGBA1C in the last 72 hours. CBG: No results for input(s): GLUCAP in the last 168 hours. Lipid Profile: No results  for input(s): CHOL, HDL, LDLCALC, TRIG, CHOLHDL, LDLDIRECT in the last 72 hours. Thyroid Function Tests: No results for input(s): TSH, T4TOTAL, FREET4, T3FREE, THYROIDAB in the last 72 hours. Anemia Panel: Recent Labs    11/19/17 1249  TIBC 232*  IRON 74   Urine analysis:    Component Value Date/Time   COLORURINE STRAW (A) 11/19/2017 1518   APPEARANCEUR CLEAR 11/19/2017 1518   LABSPEC 1.006 11/19/2017 1518   PHURINE 7.0 11/19/2017 1518   GLUCOSEU NEGATIVE 11/19/2017 1518   HGBUR MODERATE (A) 11/19/2017 1518   BILIRUBINUR NEGATIVE 11/19/2017 1518   KETONESUR NEGATIVE 11/19/2017 1518   PROTEINUR 30 (A) 11/19/2017 1518   NITRITE NEGATIVE 11/19/2017 1518   LEUKOCYTESUR NEGATIVE 11/19/2017 1518   No results found for this  or any previous visit (from the past 240 hour(s)).    Radiology Studies: No results found.  Scheduled Meds: . calcitRIOL  0.25 mcg Oral Daily  . cycloSPORINE modified  50 mg Oral QHS  . cycloSPORINE modified  75 mg Oral Daily  . heparin injection (subcutaneous)  5,000 Units Subcutaneous Q8H  . hydrALAZINE  50 mg Oral Q8H  . predniSONE  5 mg Oral Q breakfast   Continuous Infusions:   LOS: 13 days   Time spent: 25 minutes.  Patrecia Pour, MD [facility] [email or web address] Password [email or web address] 11/22/2017, 11:26 AM

## 2017-11-23 LAB — RENAL FUNCTION PANEL
ALBUMIN: 2.7 g/dL — AB (ref 3.5–5.0)
ANION GAP: 9 (ref 5–15)
BUN: 78 mg/dL — AB (ref 6–20)
CALCIUM: 9.2 mg/dL (ref 8.9–10.3)
CO2: 24 mmol/L (ref 22–32)
Chloride: 107 mmol/L (ref 101–111)
Creatinine, Ser: 4.56 mg/dL — ABNORMAL HIGH (ref 0.61–1.24)
GFR calc Af Amer: 13 mL/min — ABNORMAL LOW (ref >60)
GFR, EST NON AFRICAN AMERICAN: 11 mL/min — AB (ref >60)
Glucose, Bld: 96 mg/dL (ref 65–99)
PHOSPHORUS: 4.9 mg/dL — AB (ref 2.5–4.6)
POTASSIUM: 4.7 mmol/L (ref 3.5–5.1)
SODIUM: 140 mmol/L (ref 135–145)

## 2017-11-23 LAB — CYCLOSPORINE: Cyclosporine, LabCorp: 96 ng/mL — ABNORMAL LOW (ref 100–400)

## 2017-11-23 NOTE — Progress Notes (Signed)
PT Note  Patient Details Name: Caleb Hicks MRN: [id number] DOB: 07-24-1940  Received order for reeval for PT. Chart reviewed and checked with nursing staff. Pt continues to walk daily in the hallways with nursing staff supervision and see no acute concerns for acute PT at this time. Pt was evaluated and treated by PT on 5/23, 5/24, 5/27, 5/29,  And 5/31. PT checked back in with pt on 6/4 and he stated he was doing fine ambulating in hallways during day with nursing staff supervising (due to lines) and he felt no other acute PT needs at this time.  Also noted patient was evaluated and seen by OT on 5/23, 5/27, and 5/29 and signed off after all education complete and patient and daughter with no further questions.   Still feel pt would benefit from HHPT to assess home safety needs if he agrees.   No further Acute PT needs at this time, however if status or needs change, please don't hesitate to let us know.  Thank you,       Clide Dales 11/23/2017, 10:43 AM  Clide Dales, PT Pager: [phone] 11/23/2017

## 2017-11-23 NOTE — Progress Notes (Signed)
Subjective: Interval History: has no complaint , doing better. Creat down.   Objective: Vital signs in last 24 hours: Temp:  [97.6 F (36.4 C)] 97.6 F (36.4 C) (06/07 0549) Pulse Rate:  [61-68] 67 (06/07 1053) Resp:  [16-18] 16 (06/07 0549) BP: (143-175)/(63-73) 143/71 (06/07 1053) SpO2:  [96 %-98 %] 96 % (06/07 0549) Weight:  [70.3 kg (154 lb 15.7 oz)] 70.3 kg (154 lb 15.7 oz) (06/07 0500) Weight change: 0.2 kg (7.1 oz)  Intake/Output from previous day: 06/06 0701 - 06/07 0700 In: 560 [P.O.:560] Out: 850 [Urine:850] Intake/Output this shift: Total I/O In: -  Out: 100 [Urine:100]  General appearance: alert, cooperative and cushingoid Resp: clear to auscultation bilaterally Cardio: S1, S2 normal and systolic murmur: systolic ejection 2/6, decrescendo at 2nd left intercostal space GI: pos bs, soft, Renal Tx RLQ Extremities: edema 2+  Lab Results: Recent Labs    11/21/17 0452  WBC 5.7  HGB 8.6*  HCT 26.5*  PLT 300   BMET:  Recent Labs    11/22/17 0507 11/23/17 0509  NA 140 140  K 4.7 4.7  CL 105 107  CO2 24 24  GLUCOSE 93 96  BUN 81* 78*  CREATININE 4.79* 4.56*  CALCIUM 9.0 9.2   No results for input(s): PTH in the last 72 hours. Iron Studies:  No results for input(s): IRON, TIBC, TRANSFERRIN, FERRITIN in the last 72 hours.  Studies/Results: No results found.  I have reviewed the patient's current medications.  Assessment/Plan: 1 Renal Tx with CKD 4 and AKI now slowly better, ATN vs AIN, improving now, creat continues down w/o foley in. [place] for dc, will sign off.  Pt f/b [facility] nephrology.   2 HTN  3 Anemia  4 Divetic dz 5 HPTH vit D  P as above   LOS: 14 days   [name] 11/23/2017,2:45 PM

## 2017-11-23 NOTE — Progress Notes (Signed)
PROGRESS NOTE  Caleb Hicks  WUJ:[id number] DOB: [birth year] DOA: 11/07/2017 PCP: Patient, No Pcp Per   Brief Narrative: Caleb Hicks is a 77 year old male with a history of IgA nephropathy s/p renal transplant and now stage III CKD, HTN, HLD, AAA, admitted on 11/07/2017 with complaints of generalized weakness, abdominal discomfort, nausea/diarrhea, found to have viral gastroenteritis with secondary adrenal insufficiency.  CT scan of the abdomen showed evidence of diverticulitis.  Patient was subsequently found to have Pseudomonas bacteremia. Zosyn was continued and levaquin used to complete 10 days of therapy. Hospitalization has been complicated by progressive renal failure, suspected due to ATN from sepsis, possibly also due to antibiotics. Nephrology was consulted and renal function has been monitored closely for recovery. If worsens, may need dialysis.  Assessment & Plan: Active Problems:   Acute kidney injury superimposed on CKD [facility]): Stage 4   Dehydration   HTN (hypertension)   Hyperlipidemia   Renal transplant, status post: 21 years ago: 1997   Iron deficiency anemia   Sepsis ([place])   Gastroenteritis   Sepsis due to Pseudomonas ([place])  Sepsis due to Pseudomonas bacteremia: Resolved. Possibly related to sigmoid diverticulitis.  - +Blood cultures 5/22 with repeat on 5/24 negative. Patient has completed 12 days of antibiotics, initially vanc x1, and zosyn continued, transitioned to renally-dosed levaquin final dose on 6/1.   Sigmoid diverticulitis: CT abdomen showed mild sigmoid diverticulitis without any perforation - Symptoms resolved, continue diet (renal)  AKI on CKD stage IV in renal transplant patient: Rise of creatinine has stalled,now improving, although slowly, hopeful for recovery. Non oliguric. FENa consistent with intrarenal pathology/ATN. Renal U/S unremarkable.  Creatinine has continued to improve despite discontinuation of Foley, no signs of urinary retention No  indication for urology consultation at this time Continue strict I's and O's  Chronic diastolic HF: BNP 956, BLE edema resolved s/p lasix. ECHO with EF 65-70%, Grade 1DD. Appears euvolemic. - Strict I/O, daily weights.  - Hold diuretic  History of renal transplant: No perirenal edema/evidence of rejection. - Continue cyclosporine, steroids  Hypertension:  - Hydralazine 50 mg every 8 hours  - Held home metoprolol due to persistent sinus bradycardia. BP elevated, HR >70 consistently, will restart at 25mg  po BID (titrate to home 50 BID as able)  Anemia of CKD: Stable - Monitor  DVT prophylaxis: Heparin Code Status: DNR Family Communication: None at bedside on bedside rounds this AM.  Disposition Plan: Home once renal function improved hopefully tomorrow.   Consultants:   Nephrology  Procedures:   None  Antimicrobials:  Vancomycin 5/22  Zosyn 5/22 > levaquin through 6/1  Subjective: No complaints. Itching is better. No pain, N/V/D. He has had good urine output and good by mouth intake   Objective: Vitals:   11/22/17 2234 11/23/17 0500 11/23/17 0549 11/23/17 1053  BP: (!) 175/63  (!) 163/73 (!) 143/71  Pulse: 68  61 67  Resp: 18  16   Temp: 97.6 F (36.4 C)  97.6 F (36.4 C)   TempSrc:      SpO2: 98%  96%   Weight:  70.3 kg (154 lb 15.7 oz)    Height:        Intake/Output Summary (Last 24 hours) at 11/23/2017 1104 Last data filed at 11/23/2017 0805 Gross per 24 hour  Intake 360 ml  Output 950 ml  Net -590 ml   Filed Weights   11/21/17 0539 11/22/17 0501 11/23/17 0500  Weight: 70.2 kg (154 lb 12.2 oz) 70.1 kg (154 lb 8.7  oz) 70.3 kg (154 lb 15.7 oz)    Gen: 77 y.o. male in no distress sitting in bedside chair Pulm: Non-labored breathing room air. Clear to auscultation bilaterally.  CV: Regular rate and rhythm. II/VI SEM at LSB GI: Abdomen soft, non-tender, non-distended, +BS, scar from renal xplant in RLQ Ext: Trace pitting LE edema Skin: No rashes,  lesions stasis LE changes. No other ulcers Neuro: Alert and oriented. No focal neurological deficits. No asterixis. Psych: Judgement and insight appear normal. Mood & affect appropriate.   Data Reviewed: I have personally reviewed following labs and imaging studies  CBC: Recent Labs  Lab 11/17/17 0538 11/18/17 0509 11/21/17 0452  WBC 5.5 6.1 5.7  NEUTROABS 4.1 4.7 4.2  HGB 8.4* 8.2* 8.6*  HCT 26.4* 25.2* 26.5*  MCV 94.3 93.0 93.6  PLT 286 295 315   Basic Metabolic Panel: Recent Labs  Lab 11/18/17 0509 11/19/17 0534 11/19/17 1249 11/20/17 0524 11/20/17 1128 11/21/17 0452 11/22/17 0507 11/23/17 0509  NA 137 136  --  140  --  140 140 140  K 4.2 4.0  --  4.3  --  4.4 4.7 4.7  CL 101 98*  --  101  --  105 105 107  CO2 24 24  --  25  --  24 24 24   GLUCOSE 99 91  --  92  --  91 93 96  BUN 81* 88*  --  91*  --  87* 81* 78*  CREATININE 4.70* 4.99*  --  5.30*  --  4.98* 4.79* 4.56*  CALCIUM 8.6* 8.7*  --  8.6*  --  8.8* 9.0 9.2  MG  --   --  1.7  --  1.7  --   --   --   PHOS 5.4*  --   --  6.2*  --  6.0* 5.7* 4.9*   GFR: Estimated Creatinine Clearance: 12.7 mL/min (A) (by C-G formula based on SCr of 4.56 mg/dL (H)). Liver Function Tests: Recent Labs  Lab 11/18/17 0509 11/20/17 0524 11/21/17 0452 11/22/17 0507 11/23/17 0509  ALBUMIN 2.4* 2.6* 2.8* 2.7* 2.7*   No results for input(s): LIPASE, AMYLASE in the last 168 hours. No results for input(s): AMMONIA in the last 168 hours. Coagulation Profile: No results for input(s): INR, PROTIME in the last 168 hours. Cardiac Enzymes: No results for input(s): CKTOTAL, CKMB, CKMBINDEX, TROPONINI in the last 168 hours. BNP (last 3 results) No results for input(s): PROBNP in the last 8760 hours. HbA1C: No results for input(s): HGBA1C in the last 72 hours. CBG: No results for input(s): GLUCAP in the last 168 hours. Lipid Profile: No results for input(s): CHOL, HDL, LDLCALC, TRIG, CHOLHDL, LDLDIRECT in the last 72  hours. Thyroid Function Tests: No results for input(s): TSH, T4TOTAL, FREET4, T3FREE, THYROIDAB in the last 72 hours. Anemia Panel: No results for input(s): VITAMINB12, FOLATE, FERRITIN, TIBC, IRON, RETICCTPCT in the last 72 hours. Urine analysis:    Component Value Date/Time   COLORURINE STRAW (A) 11/19/2017 1518   APPEARANCEUR CLEAR 11/19/2017 1518   LABSPEC 1.006 11/19/2017 1518   PHURINE 7.0 11/19/2017 1518   GLUCOSEU NEGATIVE 11/19/2017 1518   HGBUR MODERATE (A) 11/19/2017 1518   BILIRUBINUR NEGATIVE 11/19/2017 1518   KETONESUR NEGATIVE 11/19/2017 1518   PROTEINUR 30 (A) 11/19/2017 1518   NITRITE NEGATIVE 11/19/2017 1518   LEUKOCYTESUR NEGATIVE 11/19/2017 1518   No results found for this or any previous visit (from the past 240 hour(s)).    Radiology Studies: No results  found.  Scheduled Meds: . calcitRIOL  0.25 mcg Oral Daily  . cycloSPORINE modified  50 mg Oral QHS  . cycloSPORINE modified  75 mg Oral Daily  . heparin injection (subcutaneous)  5,000 Units Subcutaneous Q8H  . hydrALAZINE  50 mg Oral Q8H  . metoprolol tartrate  25 mg Oral BID  . predniSONE  5 mg Oral Q breakfast   Continuous Infusions:   LOS: 14 days   Time spent: 25 minutes.  Reyne Dumas, MD [facility] [email or web address] Password [email or web address] 11/23/2017, 11:04 AM

## 2017-11-24 DIAGNOSIS — A419 Sepsis, unspecified organism: Secondary | ICD-10-CM

## 2017-11-24 DIAGNOSIS — N189 Chronic kidney disease, unspecified: Secondary | ICD-10-CM

## 2017-11-24 DIAGNOSIS — I5032 Chronic diastolic (congestive) heart failure: Secondary | ICD-10-CM

## 2017-11-24 DIAGNOSIS — Z940 Kidney transplant status: Secondary | ICD-10-CM

## 2017-11-24 DIAGNOSIS — N179 Acute kidney failure, unspecified: Secondary | ICD-10-CM

## 2017-11-24 LAB — BASIC METABOLIC PANEL
Anion gap: 9 (ref 5–15)
BUN: 79 mg/dL — AB (ref 6–20)
CALCIUM: 9 mg/dL (ref 8.9–10.3)
CO2: 24 mmol/L (ref 22–32)
Chloride: 107 mmol/L (ref 101–111)
Creatinine, Ser: 4.56 mg/dL — ABNORMAL HIGH (ref 0.61–1.24)
GFR calc Af Amer: 13 mL/min — ABNORMAL LOW (ref >60)
GFR, EST NON AFRICAN AMERICAN: 11 mL/min — AB (ref >60)
GLUCOSE: 94 mg/dL (ref 65–99)
POTASSIUM: 4.8 mmol/L (ref 3.5–5.1)
SODIUM: 140 mmol/L (ref 135–145)

## 2017-11-24 LAB — CYCLOSPORINE: CYCLOSPORINE, LABCORP: 85 ng/mL — AB (ref 100–400)

## 2017-11-24 MED ORDER — CYCLOSPORINE MODIFIED (NEORAL) 25 MG PO CAPS
75.0000 mg | ORAL_CAPSULE | Freq: Every day | ORAL | 0 refills | Status: DC
Start: 2017-11-24 — End: 2018-12-30

## 2017-11-24 MED ORDER — TELMISARTAN 80 MG PO TABS: 80.0000 mg | ORAL_TABLET | Freq: Every day | ORAL | 2 refills | Status: DC

## 2017-11-24 MED ORDER — CALCITRIOL 0.25 MCG PO CAPS: 0.2500 ug | ORAL_CAPSULE | Freq: Every day | ORAL | 0 refills | Status: AC

## 2017-11-24 MED ORDER — FUROSEMIDE 40 MG PO TABS: 40.0000 mg | ORAL_TABLET | Freq: Every day | ORAL | Status: AC

## 2017-11-24 NOTE — Discharge Summary (Signed)
Physician Discharge Summary  Parry Po BSJ:[id number] DOB: [birth year] DOA: 11/07/2017  PCP: Patient, No Pcp Per  Admit date: 11/07/2017 Discharge date: 11/24/2017  Admitted From: home Disposition:  Home  Recommendations for Outpatient Follow-up:  1. Follow up with Nephrology in 1-2 weeks 2. Please obtain BMP in one week   Home Health:no Equipment/Devices:none  Discharge Condition:stable CODE STATUS:full Diet recommendation: Heart Healthy   Brief/Interim Summary: 77 y.o. male status post renal transplant on cyclosporine and prednisone, due to IgA nephropathy now stage III chronic kidney disease, acute diverticulitis back in 03/28/2017 comes into the hospital admitted on 11/05/2017 for low-grade fever diarrhea and shaking chills, was found to have Pseudomonas bacteremia and relative adrenal insufficiency.  CT scan done on admission showed new acute diverticulitis he was started empirically on IV Levaquin and Zosyn, hospitalization his pain complicated by progressive renal function deterioration, suspected to be ATN secondary to sepsis and/or antibiotics.    Discharge Diagnoses:  Active Problems:   Acute kidney injury superimposed on CKD [facility]): Stage 4   Dehydration   HTN (hypertension)   Hyperlipidemia   Renal transplant, status post: 21 years ago: 1997   Iron deficiency anemia   Sepsis ([place])   Gastroenteritis   Sepsis due to Pseudomonas ([place])  Sepsis secondary to Pseudomonas bacteremia in the setting of acute diverticulitis: Blood cultures on 11/07/2017 were positive for Pseudomonas for which she was treated with 12 days of antibiotic, initially IV Vanco and Zosyn, which was transitioned to Levaquin with his final dose on 11/17/2017 His symptoms is resolved he is tolerating his diet.  Acute kidney injury on chronic kidney disease stage IV (with a history of renal transplant) Nephrology has been consulted and recommended conservative management with strict I's and O's. ATN  versus acute interstitial nephritis, his creatinine is slowly improving. Will up with nephrology as an outpatient is ARB has been stopped we will follow-up with urology in 2 weeks check a basic metabolic panel then.  Chronic diastolic heart failure: Continue strict I's and O's Daily weights continue to hold diuretics.  Renal transplant, status post: 21 years ago: 1997 Continue cyclosporine and steroids.  Essential hypertension: No changes made to his medication will continue current regimen.  Anemia of chronic disease Follow-up with nephrology as an outpatient.    Discharge Instructions  Discharge Instructions    Diet - low sodium heart healthy   Complete by:  As directed    Increase activity slowly   Complete by:  As directed      Allergies as of 11/24/2017      Reactions   Norvasc [amlodipine Besylate]    Pt state it cause him weight gain   Tape Other (See Comments)      Medication List    TAKE these medications   allopurinol 100 MG tablet Commonly known as:  ZYLOPRIM Take 100 mg by mouth 2 (two) times daily.   atorvastatin 20 MG tablet Commonly known as:  LIPITOR Take 20 mg by mouth daily.   calcitRIOL 0.25 MCG capsule Commonly known as:  ROCALTROL Take 1 capsule (0.25 mcg total) by mouth daily.   calcium gluconate 500 MG tablet Take 500-1,000 mg by mouth 2 (two) times daily. 2 in am, 1 in pm   furosemide 40 MG tablet Commonly known as:  LASIX Take 1 tablet (40 mg total) by mouth daily. Start taking on:  11/27/2017 What changed:  These instructions start on 11/27/2017. If you are unsure what to do until then, ask your doctor or  other care provider.   HYDROcodone-acetaminophen 5-325 MG tablet Commonly known as:  NORCO/VICODIN Take 1 tablet by mouth every 4 (four) hours as needed for moderate pain.   metoprolol tartrate 50 MG tablet Commonly known as:  LOPRESSOR Take 50 mg by mouth 2 (two) times daily.   NEORAL 25 MG capsule Generic drug:   cycloSPORINE modified Take 50-75 mg by mouth 2 (two) times daily. Take 75 mg (3 capsules) in the morning and 50 mg (2 capsules) in the evening What changed:  Another medication with the same name was added. Make sure you understand how and when to take each.   cycloSPORINE modified 25 MG capsule Commonly known as:  NEORAL Take 3 capsules (75 mg total) by mouth daily. What changed:  You were already taking a medication with the same name, and this prescription was added. Make sure you understand how and when to take each.   predniSONE 5 MG tablet Commonly known as:  DELTASONE Take 5 mg by mouth daily.   senna-docusate 8.6-50 MG tablet Commonly known as:  Senokot-S Take 1 tablet by mouth at bedtime. What changed:    when to take this  reasons to take this   telmisartan 80 MG tablet Commonly known as:  MICARDIS Take 1 tablet (80 mg total) by mouth daily. Start taking on:  12/04/2017 What changed:  These instructions start on 12/04/2017. If you are unsure what to do until then, ask your doctor or other care provider.      Follow-up Information    [facility] Follow up.   Specialty:  Home Health Services Why:  [facility] physical/occupational therapy,aide Contact information: [place] [address] [phone]          Allergies  Allergen Reactions  . Norvasc [Amlodipine Besylate]     Pt state it cause him weight gain  . Tape Other (See Comments)    Consultations:  Nephrology   Procedures/Studies: Ct Abdomen Pelvis Wo Contrast  Result Date: 11/09/2017 CLINICAL DATA:  Stage III renal disease with renal transplant and IgA nephropathy. Patient presents complaining of generalized weakness and abdominal discomfort with nausea and diarrhea. EXAM: CT ABDOMEN AND PELVIS WITHOUT CONTRAST TECHNIQUE: Multidetector CT imaging of the abdomen and pelvis was performed following the standard protocol without IV contrast. COMPARISON:  03/24/2017 CT  FINDINGS: Lower chest: Heart is top-normal in size. Coronary arteriosclerosis is noted along the LAD and left circumflex. Thoracic aortic atherosclerosis is noted as well. No pericardial effusion is seen. Trace pleural effusions with bibasilar atelectasis. Mild bilateral lower lobe cylindrical bronchiectasis. Hepatobiliary: Distended gallbladder without mural thickening or pericholecystic fluid. Dependent calculi and/or biliary sludge is redemonstrated. The unenhanced liver is unremarkable. Pancreas: No ductal dilatation, mass or inflammation. Spleen: Normal in size without focal abnormality. Adrenals/Urinary Tract: No adrenal mass. Marked renal cortical thinning with hypertrophy of the medullary sinus fat within both kidneys. Redemonstration of simple and complex cystic masses of both kidneys. A more homogeneous hyperdense cyst possibly proteinaceous or hemorrhagic is redemonstrated within the right kidney measuring 11 mm in diameter. Transplanted right iliac fossa renal cyst with no focal mass or obstruction identified. Decompressed urinary bladder without focal abnormality. Stomach/Bowel: Mild pericolonic inflammation associated with the sigmoid colon secondary to acute uncomplicated mild diverticulitis. Redemonstration of distal colonic and sigmoid diverticulosis with circular muscle hypertrophy. No bowel obstruction is seen. Contrast distended stomach. Normal small bowel rotation without obstruction or inflammation. Normal appendix. Vascular/Lymphatic: Moderate aortoiliac atherosclerosis with infrarenal abdominal aortic aneurysm measuring  up to 3.5 cm as before. 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 Reproductive: Prostate is unremarkable. Other: Mild fat containing supraumbilical fat containing ventral hernia. Musculoskeletal: Marked disc flattening and vacuum disc phenomenon at L4-5 with grade 1 spondylolisthesis of L4 on L5. No pars defects. No suspicious  osseous lesions. IMPRESSION: 1. Acute uncomplicated distal sigmoid diverticulitis with mild pericolonic inflammation. 2. Marked atrophy of the native renal kidneys with simple and complex cysts as before. Stable appearing transplanted right iliac fossa kidney. 3. Stable 3.5 cm infrarenal abdominal aortic aneurysm. Recommend followup by ultrasound in 2 years. This recommendation follows ACR consensus guidelines: White Paper of the ACR Incidental Findings Committee II on Vascular Findings. J Am Coll Radiol 2013; 95:638-756. 4. Uncomplicated cholelithiasis. 5. Coronary arteriosclerosis. Electronically Signed   By: Ashley Royalty M.D.   On: 11/09/2017 22:33   US Renal Transplant W/doppler  Result Date: 11/16/2017 CLINICAL DATA:  77 year old male with a history of prior renal transplant. Acute kidney injury with hypertension history EXAM: ULTRASOUND OF RENAL TRANSPLANT WITH RENAL DOPPLER ULTRASOUND TECHNIQUE: Ultrasound examination of the renal transplant was performed with gray-scale, color and duplex doppler evaluation. COMPARISON:  CT 11/09/2017 FINDINGS: Transplant kidney location: RLQ Transplant Kidney: Length: 4.8 cm. Normal in size and parenchymal echogenicity. No hydronephrosis. Cortical thinning. No significant hyperechoic echotexture of the parenchyma. No peri-transplant fluid collection seen. Anechoic cystic lesion on the lateral cortex measures 1.1 cm x 1.1 cm x 1.1 cm with no flow, internal complexity, and with through transmission. Color flow in the main renal artery:  Yes Color flow in the main renal vein:  Yes Duplex Doppler Evaluation: Main Renal Artery Resistive Index: 0.83 Venous waveform in main renal vein:  Present Intrarenal resistive index in upper pole:  0.74 (normal 0.6-0.8; equivocal 0.8-0.9; abnormal >= 0.9) Intrarenal resistive index in lower pole: 0.54 (normal 0.6-0.8; equivocal 0.8-0.9; abnormal >= 0.9) Bladder: Normal for degree of bladder distention. Other findings:  None. IMPRESSION: No  evidence of hydronephrosis of right lower quadrant transplant kidney. The echogenicity is within normal limits. Resistive index of the main renal artery in the equivocal range, however, the RI at the superior and lower pole measured within normal limits. Benign cyst on the lateral cortex. Electronically Signed   By: Corrie Mckusick D.O.   On: 11/16/2017 16:26   Dg Chest Port 1 View  Result Date: 11/17/2017 CLINICAL DATA:  CHF (congestive heart failure) Hx HTN, former smoker. EXAM: PORTABLE CHEST 1 VIEW COMPARISON:  Chest x-rays dated 11/08/2017 and 11/07/2017. FINDINGS: Heart size and mediastinal contours are stable. Aortic atherosclerosis. Lungs are clear. No pleural effusion or pneumothorax seen. Osseous structures about the chest are unremarkable. IMPRESSION: No active disease.  No evidence of pneumonia or pulmonary edema. Aortic atherosclerosis. Electronically Signed   By: Franki Cabot M.D.   On: 11/17/2017 07:39   Dg Chest Port 1 View  Result Date: 11/08/2017 CLINICAL DATA:  Shortness of breath EXAM: PORTABLE CHEST 1 VIEW COMPARISON:  Nov 07, 2017 FINDINGS: No edema or consolidation. The heart size and pulmonary vascularity are normal. No adenopathy. There is aortic atherosclerosis. There is evidence of old trauma involving the proximal left humeral metaphysis. IMPRESSION: No edema or consolidation. Stable cardiac silhouette. There is aortic atherosclerosis. Aortic Atherosclerosis (ICD10-I70.0). Electronically Signed   By: Lowella Grip III M.D.   On: 11/08/2017 14:32   Dg Chest Port 1 View  Result Date: 11/07/2017 CLINICAL DATA:  Generalized weakness. EXAM: PORTABLE CHEST 1 VIEW COMPARISON:  03/24/2017  FINDINGS: The heart size and mediastinal contours are within normal limits. Stable mild tortuosity of the thoracic aorta. There is no evidence of pulmonary edema, consolidation, pneumothorax, nodule or pleural fluid. The visualized skeletal structures are unremarkable. IMPRESSION: No active  disease. Electronically Signed   By: Aletta Edouard M.D.   On: 11/07/2017 17:15    Subjective: No new complaints.  Discharge Exam: Vitals:   11/23/17 2203 11/24/17 0506  BP: (!) 164/66 (!) 151/70  Pulse: 65 68  Resp: 18 17  Temp: 98.1 F (36.7 C) 98.1 F (36.7 C)  SpO2: 94% 93%   Vitals:   11/23/17 1602 11/23/17 2100 11/23/17 2203 11/24/17 0506  BP: (!) 151/60 (!) 158/67 (!) 164/66 (!) 151/70  Pulse: (!) 57 65 65 68  Resp: 18 18 18 17   Temp: 97.9 F (36.6 C) 98.4 F (36.9 C) 98.1 F (36.7 C) 98.1 F (36.7 C)  TempSrc: Oral Oral Oral Oral  SpO2: 96% 92% 94% 93%  Weight:    68.3 kg (150 lb 9.2 oz)  Height:        General: Pt is alert, awake, not in acute distress Cardiovascular: RRR, S1/S2 +, no rubs, no gallops Respiratory: CTA bilaterally, no wheezing, no rhonchi Abdominal: Soft, NT, ND, bowel sounds + Extremities: no edema, no cyanosis    The results of significant diagnostics from this hospitalization (including imaging, microbiology, ancillary and laboratory) are listed below for reference.     Microbiology: No results found for this or any previous visit (from the past 240 hour(s)).   Labs: BNP (last 3 results) Recent Labs    11/17/17 0538  BNP 188.4*   Basic Metabolic Panel: Recent Labs  Lab 11/18/17 0509  11/19/17 1249 11/20/17 0524 11/20/17 1128 11/21/17 0452 11/22/17 0507 11/23/17 0509 11/24/17 0531  NA 137   < >  --  140  --  140 140 140 140  K 4.2   < >  --  4.3  --  4.4 4.7 4.7 4.8  CL 101   < >  --  101  --  105 105 107 107  CO2 24   < >  --  25  --  24 24 24 24   GLUCOSE 99   < >  --  92  --  91 93 96 94  BUN 81*   < >  --  91*  --  87* 81* 78* 79*  CREATININE 4.70*   < >  --  5.30*  --  4.98* 4.79* 4.56* 4.56*  CALCIUM 8.6*   < >  --  8.6*  --  8.8* 9.0 9.2 9.0  MG  --   --  1.7  --  1.7  --   --   --   --   PHOS 5.4*  --   --  6.2*  --  6.0* 5.7* 4.9*  --    < > = values in this interval not displayed.   Liver Function  Tests: Recent Labs  Lab 11/18/17 0509 11/20/17 0524 11/21/17 0452 11/22/17 0507 11/23/17 0509  ALBUMIN 2.4* 2.6* 2.8* 2.7* 2.7*   No results for input(s): LIPASE, AMYLASE in the last 168 hours. No results for input(s): AMMONIA in the last 168 hours. CBC: Recent Labs  Lab 11/18/17 0509 11/21/17 0452  WBC 6.1 5.7  NEUTROABS 4.7 4.2  HGB 8.2* 8.6*  HCT 25.2* 26.5*  MCV 93.0 93.6  PLT 295 300   Cardiac Enzymes: No results for input(s): CKTOTAL, CKMB, CKMBINDEX,  TROPONINI in the last 168 hours. BNP: Invalid input(s): POCBNP CBG: No results for input(s): GLUCAP in the last 168 hours. D-Dimer No results for input(s): DDIMER in the last 72 hours. Hgb A1c No results for input(s): HGBA1C in the last 72 hours. Lipid Profile No results for input(s): CHOL, HDL, LDLCALC, TRIG, CHOLHDL, LDLDIRECT in the last 72 hours. Thyroid function studies No results for input(s): TSH, T4TOTAL, T3FREE, THYROIDAB in the last 72 hours.  Invalid input(s): FREET3 Anemia work up No results for input(s): VITAMINB12, FOLATE, FERRITIN, TIBC, IRON, RETICCTPCT in the last 72 hours. Urinalysis    Component Value Date/Time   COLORURINE STRAW (A) 11/19/2017 1518   APPEARANCEUR CLEAR 11/19/2017 1518   LABSPEC 1.006 11/19/2017 1518   PHURINE 7.0 11/19/2017 1518   GLUCOSEU NEGATIVE 11/19/2017 1518   HGBUR MODERATE (A) 11/19/2017 1518   BILIRUBINUR NEGATIVE 11/19/2017 1518   KETONESUR NEGATIVE 11/19/2017 1518   PROTEINUR 30 (A) 11/19/2017 1518   NITRITE NEGATIVE 11/19/2017 1518   LEUKOCYTESUR NEGATIVE 11/19/2017 1518   Sepsis Labs Invalid input(s): PROCALCITONIN,  WBC,  LACTICIDVEN Microbiology No results found for this or any previous visit (from the past 240 hour(s)).   Time coordinating discharge: 73minutes  SIGNED:   Charlynne Cousins, MD  [facility] 11/24/2017, 9:23 AM Pager   If 7PM-7AM, please contact night-coverage [email or web address] Password [email or web address]

## 2017-11-24 NOTE — Care Management Note (Signed)
Case Management Note  Patient Details  Name: Caleb Hicks MRN: [id number] Date of Birth: [birth year]  Subjective/Objective:   Admitted with Sepsis, AKI, HTN    Action/Plan: please see previous NCM notes Contacted [facility] to make aware of scheduled dc home today. Has RW and cane at home.   Expected Discharge Date:  11/24/17               Expected Discharge Plan:  [place]  In-House Referral:  NA  Discharge planning Services  CM Consult  Post Acute Care Choice:  Durable Medical Equipment(Has rw) Choice offered to:  Patient  DME Arranged:  N/A DME Agency:  NA  HH Arranged:  PT, Nurse's Aide [place] Agency:  [place]  Status of Service:  Completed, signed off  If discussed at [place] of Stay Meetings, dates discussed:    Additional Comments:  Erenest Rasher, RN 11/24/2017, 9:51 AM

## 2017-11-24 NOTE — Progress Notes (Signed)
Patient has been discharged. Discharge instructions reviewed, questions, concerns denied. No change from am assessment. Pt is a&ox4, ambulatory.

## 2018-04-09 ENCOUNTER — Other Ambulatory Visit: Payer: Self-pay

## 2018-04-09 ENCOUNTER — Encounter (HOSPITAL_COMMUNITY): Payer: Self-pay

## 2018-04-09 ENCOUNTER — Emergency Department (HOSPITAL_COMMUNITY)
Admission: EM | Admit: 2018-04-09 | Discharge: 2018-04-09 | Disposition: A | Discharge status: Home Health | Payer: Medicare Other | Attending: Emergency Medicine | Admitting: Emergency Medicine

## 2018-04-09 DIAGNOSIS — Z940 Kidney transplant status: Secondary | ICD-10-CM | POA: Insufficient documentation

## 2018-04-09 DIAGNOSIS — I129 Hypertensive chronic kidney disease with stage 1 through stage 4 chronic kidney disease, or unspecified chronic kidney disease: Secondary | ICD-10-CM | POA: Insufficient documentation

## 2018-04-09 DIAGNOSIS — E785 Hyperlipidemia, unspecified: Secondary | ICD-10-CM | POA: Insufficient documentation

## 2018-04-09 DIAGNOSIS — Z79899 Other long term (current) drug therapy: Secondary | ICD-10-CM | POA: Insufficient documentation

## 2018-04-09 DIAGNOSIS — R197 Diarrhea, unspecified: Secondary | ICD-10-CM | POA: Insufficient documentation

## 2018-04-09 DIAGNOSIS — N184 Chronic kidney disease, stage 4 (severe): Secondary | ICD-10-CM | POA: Insufficient documentation

## 2018-04-09 LAB — COMPREHENSIVE METABOLIC PANEL
ALT: 11 U/L (ref 0–44)
AST: 9 U/L — AB (ref 15–41)
Albumin: 2.7 g/dL — ABNORMAL LOW (ref 3.5–5.0)
Alkaline Phosphatase: 53 U/L (ref 38–126)
Anion gap: 11 (ref 5–15)
BILIRUBIN TOTAL: 0.4 mg/dL (ref 0.3–1.2)
BUN: 107 mg/dL — AB (ref 8–23)
CHLORIDE: 108 mmol/L (ref 98–111)
CO2: 18 mmol/L — ABNORMAL LOW (ref 22–32)
CREATININE: 3.62 mg/dL — AB (ref 0.61–1.24)
Calcium: 9.2 mg/dL (ref 8.9–10.3)
GFR calc Af Amer: 17 mL/min — ABNORMAL LOW (ref >60)
GFR, EST NON AFRICAN AMERICAN: 15 mL/min — AB (ref >60)
Glucose, Bld: 112 mg/dL — ABNORMAL HIGH (ref 70–99)
Potassium: 4.6 mmol/L (ref 3.5–5.1)
Sodium: 137 mmol/L (ref 135–145)
TOTAL PROTEIN: 6.2 g/dL — AB (ref 6.5–8.1)

## 2018-04-09 LAB — URINALYSIS, ROUTINE W REFLEX MICROSCOPIC
Bilirubin Urine: NEGATIVE
GLUCOSE, UA: NEGATIVE mg/dL
Ketones, ur: NEGATIVE mg/dL
LEUKOCYTES UA: NEGATIVE
NITRITE: NEGATIVE
Protein, ur: NEGATIVE mg/dL
SPECIFIC GRAVITY, URINE: 1.011 (ref 1.005–1.030)
pH: 5 (ref 5.0–8.0)

## 2018-04-09 LAB — LIPASE, BLOOD: LIPASE: 42 U/L (ref 11–51)

## 2018-04-09 LAB — CBC
HCT: 32.7 % — ABNORMAL LOW (ref 39.0–52.0)
Hemoglobin: 9.9 g/dL — ABNORMAL LOW (ref 13.0–17.0)
MCH: 30.5 pg (ref 26.0–34.0)
MCHC: 30.3 g/dL (ref 30.0–36.0)
MCV: 100.6 fL — ABNORMAL HIGH (ref 80.0–100.0)
NRBC: 0 % (ref 0.0–0.2)
Platelets: 252 10*3/uL (ref 150–400)
RBC: 3.25 MIL/uL — ABNORMAL LOW (ref 4.22–5.81)
RDW: 15 % (ref 11.5–15.5)
WBC: 5.9 10*3/uL (ref 4.0–10.5)

## 2018-04-09 NOTE — ED Provider Notes (Signed)
[facility] DEPT Provider Note   CSN: [id number] Arrival date & time: 04/09/18  1210     History   Chief Complaint Chief Complaint  Patient presents with  . Diarrhea    HPI Caleb Hicks is a 77 y.o. male.  77 year old male with extensive past medical history including AAA, CKD status post kidney transplant, gout, hypertension, hyperlipidemia, diverticulitis who presents with diarrhea.  Approximately 5 days ago, he began having nonbloody diarrhea.  It persisted for about 2 days and then he continued to have loose stools but they have been more formed.  He continues to have loose stools today, approximately 6 bowel movements in the past 24 hours.  He usually does not have stools if he is not eating.  He has returned to his normal diet, no nausea or vomiting.  No fevers, abdominal pain, urinary problems, sick contacts, recent travel, or new medications.  No therapies tried prior to arrival.  Currently he feels well.  The history is provided by the patient.  Diarrhea      Past Medical History:  Diagnosis Date  . AAA (abdominal aortic aneurysm) ([place]): 4 cm 03/24/2017   3.5X 3. 5 CM PER 10-6 ABDOMINAL CT FOLOOWED EVRY 2 YEARS   . Abdominal aneurysm ([place])   . Diverticulitis 03/2017   1 UNIT BLOOD GIVEN  . Gout   . Hepatic cyst: Solitary 03/24/2017  . Hydrocele sac   . Hyperlipidemia 03/24/2017  . Hypertension   . Hyperuricemia 03/24/2017  . 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 Sepsis due to Pseudomonas ([place]) 11/09/2017  . Sepsis ([place]) 11/07/2017  . Gastroenteritis 11/07/2017  . Iron deficiency anemia 03/25/2017  . Low vitamin B12 level 03/25/2017  . GI bleed 03/24/2017  . Acute kidney injury superimposed on CKD [facility]): Stage 4 03/24/2017  . Dehydration 03/24/2017  . Acute blood loss anemia 03/24/2017  . Leukocytosis 03/24/2017  .  Sinus tachycardia 03/24/2017  . HTN (hypertension) 03/24/2017  . AAA (abdominal aortic aneurysm) ([place]): 4 cm 03/24/2017  . IgA nephropathy 03/24/2017  . Hyperlipidemia 03/24/2017  . 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 Hyperuricemia 03/24/2017  . Hepatic cyst: Solitary 03/24/2017  . Renal cyst 03/24/2017  . 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 Diverticulitis 03/24/2017  . Rectal bleeding     Past Surgical History:  Procedure Laterality Date  . CYST REMOVED FROM SPINE  YRS AGO  . HERNIA REPAIR     SEVERAL  . HYDROCELE EXCISION Bilateral 06/04/2017   Procedure: BILATERAL HYDROCELECTOMY ADULT;  Surgeon: Lucas Mallow, MD;  Location: [facility];  Service: Urology;  Laterality: Bilateral;  . PILIODINAL CYST  40-50 YRS AGO        Home Medications    Prior to Admission medications   Medication Sig Start Date End Date Taking? Authorizing Provider  allopurinol (ZYLOPRIM) 100 MG tablet Take 100 mg by mouth 2 (two) times daily. 03/13/17  Yes [provider]  aspirin 325 MG EC tablet Take 325 mg by mouth daily.   Yes [provider]  atorvastatin (LIPITOR) 20 MG tablet Take 20 mg by mouth daily. 02/20/17  Yes [provider]  calcitRIOL (ROCALTROL) 0.25 MCG capsule Take 1 capsule (0.25 mcg total) by mouth daily. Patient taking differently: Take 0.25 mcg by mouth every other day.  11/24/17  Yes  Charlynne Cousins, MD  calcium gluconate 500 MG tablet Take 1,000-1,500 mg by mouth See admin instructions. Take 1500 mg by mouth in the morning and 1000 mg by mouth at bedtime   Yes [provider]  cycloSPORINE modified (NEORAL) 25 MG capsule Take 3 capsules (75 mg total) by mouth daily. Patient taking differently: Take 50 mg by mouth 2 (two) times daily.  11/24/17  Yes Charlynne Cousins, MD  furosemide (LASIX) 40 MG tablet Take 1 tablet (40 mg total) by mouth daily. 11/27/17  Yes Charlynne Cousins, MD   metoprolol tartrate (LOPRESSOR) 50 MG tablet Take 50 mg by mouth 2 (two) times daily. 12/25/16  Yes [provider]  predniSONE (DELTASONE) 5 MG tablet Take 5 mg by mouth daily. 03/16/17  Yes [provider]  senna-docusate (SENOKOT-S) 8.6-50 MG tablet Take 1 tablet by mouth at bedtime. Patient taking differently: Take 1 tablet by mouth at bedtime as needed for moderate constipation.  03/28/17  Yes Eugenie Filler, MD  telmisartan (MICARDIS) 80 MG tablet Take 1 tablet (80 mg total) by mouth daily. 12/04/17  Yes Charlynne Cousins, MD  HYDROcodone-acetaminophen (NORCO/VICODIN) 5-325 MG tablet Take 1 tablet by mouth every 4 (four) hours as needed for moderate pain. Patient not taking: Reported on 11/07/2017 06/04/17 06/04/18  Lucas Mallow, MD    Family History Family History  Problem Relation Age of Onset  . Heart attack Father     Social History Social History   Tobacco Use  . Smoking status: Former Smoker    Packs/day: 2.00    Years: 20.00    Pack years: 40.00    Types: Cigarettes  . Smokeless tobacco: Never Used  . Tobacco comment: 1984  Substance Use Topics  . Alcohol use: No    Frequency: Never  . Drug use: No     Allergies   Norvasc [amlodipine besylate] and Tape   Review of Systems Review of Systems  Gastrointestinal: Positive for diarrhea.   All other systems reviewed and are negative except that which was mentioned in HPI   Physical Exam Updated Vital Signs BP (!) 156/70   Pulse 61   Temp 97.9 F (36.6 C) (Oral)   Resp 16   Ht 5\' 7"  (1.702 m)   Wt 65.1 kg   SpO2 99%   BMI 22.48 kg/m   Physical Exam  Constitutional: He is oriented to person, place, and time. He appears well-developed and well-nourished. No distress.  HENT:  Head: Normocephalic and atraumatic.  Moist mucous membranes  Eyes: Conjunctivae are normal.  Neck: Neck supple.  Cardiovascular: Normal rate, regular rhythm and normal heart sounds.  No murmur  heard. Pulmonary/Chest: Effort normal and breath sounds normal.  Abdominal: Soft. Bowel sounds are normal. He exhibits no distension. There is no tenderness.  Musculoskeletal: He exhibits edema.  2+ pitting edema BLE  Neurological: He is alert and oriented to person, place, and time.  Fluent speech  Skin: Skin is warm and dry.  Psychiatric: He has a normal mood and affect. Judgment normal.  Nursing note and vitals reviewed.    ED Treatments / Results  Labs (all labs ordered are listed, but only abnormal results are displayed) Labs Reviewed  COMPREHENSIVE METABOLIC PANEL - Abnormal; Notable for the following components:      Result Value   CO2 18 (*)    Glucose, Bld 112 (*)    BUN 107 (*)    Creatinine, Ser 3.62 (*)  Total Protein 6.2 (*)    Albumin 2.7 (*)    AST 9 (*)    GFR calc non Af Amer 15 (*)    GFR calc Af Amer 17 (*)    All other components within normal limits  CBC - Abnormal; Notable for the following components:   RBC 3.25 (*)    Hemoglobin 9.9 (*)    HCT 32.7 (*)    MCV 100.6 (*)    All other components within normal limits  URINALYSIS, ROUTINE W REFLEX MICROSCOPIC - Abnormal; Notable for the following components:   Hgb urine dipstick MODERATE (*)    Bacteria, UA FEW (*)    All other components within normal limits  LIPASE, BLOOD    EKG None  Radiology No results found.  Procedures Procedures (including critical care time)  Medications Ordered in ED Medications - No data to display   Initial Impression / Assessment and Plan / ED Course  I have reviewed the triage vital signs and the nursing notes.  Pertinent labs that were available during my care of the patient were reviewed by me and considered in my medical decision making (see chart for details).    Comfortable on exam, afebrile, reassuring vital signs.  Abdomen soft and nontender.  Lab work is overall reassuring.  CO2 18, BUN 107, creatinine 3.6, however these values are not  significantly different from previous given his history of CKD.  LFTs and lipase normal.  WBC normal, stable anemia with hemoglobin 9.9.  Because his symptoms have been slowly improving, I suspect he had a self-limited illness.  He has no complaints of abdominal pain and no tenderness to suggest diverticulitis.  He has been tolerating p.o. without problems.  I have discussed supportive measures including scaling back on his diet to bland foods and small meals until his symptoms resolve then gradually working his way back up to normal diet.  Also recommended starting lactobacillus.  Extensively reviewed return precautions and he voiced understanding. Final Clinical Impressions(s) / ED Diagnoses   Final diagnoses:  Diarrhea of presumed infectious origin    ED Discharge Orders    None       Rehmat Murtagh, Wenda Overland, MD 04/09/18 1551

## 2018-04-09 NOTE — ED Triage Notes (Signed)
Pt states he has loose bowels for 5 days. Pt denies blood in stool. Denies N/V. Pt states he has had "at least" 6 BMs in the last 24 hours.

## 2018-04-09 NOTE — Discharge Instructions (Addendum)
Start taking PROBIOTICS/LACTOBACILLUS as directed on bottle/packaging to help with diarrhea. Drink plenty of fluids if you are having loose stools. Eat a bland diet and small meals until your diarrhea improves and then slowly work back to normal diet. Return to ER if any bloody stools, worsening diarrhea, severe vomiting, fever, or abdominal pain.

## 2018-04-24 ENCOUNTER — Encounter: Payer: Self-pay | Admitting: Family Medicine

## 2018-04-24 ENCOUNTER — Ambulatory Visit: Payer: Medicare Other | Admitting: Family Medicine

## 2018-04-24 VITALS — BP 138/86 | HR 54 | Temp 97.7°F | Ht 66.0 in | Wt 157.4 lb

## 2018-04-24 DIAGNOSIS — I714 Abdominal aortic aneurysm, without rupture, unspecified: Secondary | ICD-10-CM

## 2018-04-24 DIAGNOSIS — N184 Chronic kidney disease, stage 4 (severe): Secondary | ICD-10-CM

## 2018-04-24 DIAGNOSIS — K5790 Diverticulosis of intestine, part unspecified, without perforation or abscess without bleeding: Secondary | ICD-10-CM

## 2018-04-24 DIAGNOSIS — Z13220 Encounter for screening for lipoid disorders: Secondary | ICD-10-CM | POA: Diagnosis not present

## 2018-04-24 DIAGNOSIS — E785 Hyperlipidemia, unspecified: Secondary | ICD-10-CM

## 2018-04-24 DIAGNOSIS — R739 Hyperglycemia, unspecified: Secondary | ICD-10-CM

## 2018-04-24 DIAGNOSIS — I10 Essential (primary) hypertension: Secondary | ICD-10-CM

## 2018-04-24 DIAGNOSIS — E790 Hyperuricemia without signs of inflammatory arthritis and tophaceous disease: Secondary | ICD-10-CM

## 2018-04-24 LAB — LIPID PANEL
Cholesterol: 183 mg/dL (ref 0–200)
HDL: 31.8 mg/dL — AB (ref >39.00)
NonHDL: 151.32
TRIGLYCERIDES: 230 mg/dL — AB (ref 0.0–149.0)
Total CHOL/HDL Ratio: 6
VLDL: 46 mg/dL — AB (ref 0.0–40.0)

## 2018-04-24 LAB — HEMOGLOBIN A1C: HEMOGLOBIN A1C: 7 % — AB (ref 4.6–6.5)

## 2018-04-24 LAB — LDL CHOLESTEROL, DIRECT: Direct LDL: 116 mg/dL

## 2018-04-24 NOTE — Patient Instructions (Signed)
It was very nice to see you today!  No changes today.  We will check blood work.  Please let me know if you have recurrent diverticulitis.  I will see you back in 1 year for your next follow-up, or sooner as needed.  Please check on your pneumonia vaccines.  You should have at least 1 vaccine with prevnar 13 and 2 vaccines with pneumovax.  Take care, Dr Jerline Pain

## 2018-04-24 NOTE — Progress Notes (Signed)
Subjective:  Caleb Hicks is a 77 y.o. male who presents today with a chief complaint of CKD and to establish care.  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  Currently follows with nephrology for this.  Sees nephrology every 2 to 3 months.  Currently on cyclosporine and prednisone for his transplant.  Diverticulitis Patient is approximately 3 times over the last 2 years.  A couple of these times resulted in hospitalization.  Does not currently have any symptoms.  No diarrhea.  No abdominal pain.  AAA Incidentally found on CT scans above.  Last CT scan done 6 months ago showed stable AAA with recommendation for follow-up ultrasound screening in 2 years.  Dyslipidemia Several year history.  On Lipitor 20 mg daily tolerating well.  Hypertension On metoprolol 50 mg twice daily and telmisartan 80 mg daily.  Gout/hyperuricemia Several year history.  On allopurinol 100 mg twice daily and tolerating well.  Has not had a gout flare in several years.  ROS: Per HPI, otherwise a complete review of systems was negative.   PMH:  The following were reviewed and entered/updated in epic: Past Medical History:  Diagnosis Date  . AAA (abdominal aortic aneurysm) ([place]): 4 cm 03/24/2017   3.5X 3. 5 CM PER 10-6 ABDOMINAL CT FOLOOWED EVRY 2 YEARS   . Abdominal aneurysm ([place])   . Acute kidney injury superimposed on CKD [facility]): Stage 4 03/24/2017  . Diverticulitis 03/2017   1 UNIT BLOOD GIVEN  . Gout   . Hepatic cyst: Solitary 03/24/2017  . Hydrocele sac   . Hyperlipidemia 03/24/2017  . Hypertension   . Hyperuricemia 03/24/2017  . 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 CKD (chronic kidney disease) stage 4, GFR 15-29 ml/min ([facility]) 04/24/2018  .  Diverticulosis with recurrent diverticulitis 04/24/2018  . Iron deficiency anemia 03/25/2017  . Low vitamin B12 level 03/25/2017  . HTN (hypertension) 03/24/2017  . AAA [facility]): 4 cm - repeat 10/2019 03/24/2017  . IgA nephropathy 03/24/2017  . Hyperlipidemia 03/24/2017  . 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 Hyperuricemia 03/24/2017  . Hepatic cyst: Solitary 03/24/2017  . Renal cyst 03/24/2017  . 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 CYST REMOVED FROM SPINE  YRS AGO  . HERNIA REPAIR     SEVERAL  . HYDROCELE EXCISION Bilateral 06/04/2017   Procedure: BILATERAL HYDROCELECTOMY ADULT;  Surgeon: Lucas Mallow, MD;  Location[facility];  Service: Urology;  Laterality: Bilateral;  . PILIODINAL CYST  40-50 YRS AGO    Family History  Problem Relation Age of Onset  . Heart attack Father   . AAA (abdominal aortic aneurysm) Brother   . Cerebral aneurysm Brother     Medications- reviewed and updated Current Outpatient Medications  Medication Sig Dispense Refill  . allopurinol (ZYLOPRIM) 100 MG tablet Take 100 mg by mouth 2 (two) times daily.  0  . aspirin 325 MG EC tablet Take 325 mg by mouth daily.    Marland Kitchen atorvastatin (LIPITOR) 20 MG tablet Take 20 mg by mouth daily.    . calcitRIOL (ROCALTROL) 0.25 MCG capsule Take 1 capsule (0.25 mcg total) by mouth daily. (Patient taking differently: Take  0.25 mcg by mouth every other day. ) 30 capsule 0  . calcium gluconate 500 MG tablet Take 1,000-1,500 mg by mouth See admin instructions. Take 1500 mg by mouth in the morning and 1000 mg by mouth at bedtime    . cycloSPORINE modified (NEORAL) 25 MG capsule Take 3 capsules (75 mg total) by mouth daily. (Patient taking differently: Take 50 mg by mouth 2 (two) times daily. ) 30 capsule 0  . furosemide (LASIX) 40 MG tablet Take 1 tablet (40 mg total) by mouth daily. (Patient taking differently: Take  80 mg by mouth daily. ) 30 tablet   . metoprolol tartrate (LOPRESSOR) 50 MG tablet Take 50 mg by mouth 2 (two) times daily.  0  . predniSONE (DELTASONE) 5 MG tablet Take 5 mg by mouth daily.  0  . senna-docusate (SENOKOT-S) 8.6-50 MG tablet Take 1 tablet by mouth at bedtime. (Patient taking differently: Take 1 tablet by mouth at bedtime as needed for moderate constipation. )    . telmisartan (MICARDIS) 80 MG tablet Take 1 tablet (80 mg total) by mouth daily.  2   No current facility-administered medications for this visit.     Allergies-reviewed and updated Allergies  Allergen Reactions  . Norvasc [Amlodipine Besylate] Other (See Comments)    Weight gain  . Tape Other (See Comments)    Tears skin    Social History   Socioeconomic History  . Marital status: Divorced    Spouse name: Not on file  . Number of children: Not on file  . Years of education: Not on file  . Highest education level: Not on file  Occupational History  . Not on file  Social Needs  . Financial resource strain: Not on file  . Food insecurity:    Worry: Not on file    Inability: Not on file  . Transportation needs:    Medical: Not on file    Non-medical: Not on file  Tobacco Use  . Smoking status: Former Smoker    Packs/day: 2.00    Years: 20.00    Pack years: 40.00    Types: Cigarettes  . Smokeless tobacco: Never Used  . Tobacco comment: 1984  Substance and Sexual Activity  . Alcohol use: No    Frequency: Never  . Drug use: No  . Sexual activity: Not on file  Lifestyle  . Physical activity:    Days per week: Not on file    Minutes per session: Not on file  . Stress: Not on file  Relationships  . Social connections:    Talks on phone: Not on file    Gets together: Not on file    Attends religious service: Not on file    Active member of club or organization: Not on file    Attends meetings of clubs or organizations: Not on file    Relationship status: Not on file  Other Topics Concern    . Not on file  Social History Narrative  . Not on file    Objective:  Physical Exam: BP 138/86 (BP Location: Left Arm, Patient Position: Sitting, Cuff Size: Normal)   Pulse (!) 54   Temp 97.7 F (36.5 C) (Oral)   Ht 5\' 6"  (1.676 m)   Wt 157 lb 6.4 oz (71.4 kg)   SpO2 98%   BMI 25.41 kg/m   Gen: NAD, resting comfortably CV: RRR with no murmurs appreciated Pulm: NWOB, CTAB with no crackles, wheezes, or rhonchi GI: Normal bowel sounds present.  Soft, Nontender, Nondistended. MSK: No edema, cyanosis, or clubbing noted Skin: Warm, dry Neuro: Grossly normal, moves all extremities Psych: Normal affect and thought content   Assessment/Plan:  CKD (chronic kidney disease) stage 4, GFR 15-29 ml/min (HCC) Stable.  Avoid nephrotoxic medications.  Discussed importance of avoidance of dehydration and good glycemic and blood pressure control.  Diverticulosis with recurrent diverticulitis No flares today.  Discussed importance of early management to prevent dehydration and hospitalization.  AAA [facility]): 4 cm - repeat 10/2019 Stable on CT scan from 6 months ago.  Recommended follow-up ultrasound in 2 years.  Hyperuricemia Stable.  Continue allopurinol 100 mg twice daily.  Hyperlipidemia Stable.  Check lipid panel.  Continue Lipitor 20 mg daily.  HTN (hypertension) At goal.  Continue telmisartan 80 mg daily and metoprolol 50 mg twice daily.  Preventative health care Check lipid panel.  Patient has had a few pneumonia vaccines in the past, but does not remember when or which ones.  He will check on this and let me know.  Algis Greenhouse. Jerline Pain, MD 04/24/2018 12:15 PM

## 2018-04-24 NOTE — Assessment & Plan Note (Signed)
No flares today.  Discussed importance of early management to prevent dehydration and hospitalization.

## 2018-04-24 NOTE — Assessment & Plan Note (Signed)
Stable.  Avoid nephrotoxic medications.  Discussed importance of avoidance of dehydration and good glycemic and blood pressure control.

## 2018-04-24 NOTE — Assessment & Plan Note (Signed)
Stable.  Continue allopurinol 100 mg twice daily.

## 2018-04-24 NOTE — Assessment & Plan Note (Signed)
Stable.  Check lipid panel.  Continue Lipitor 20 mg daily.

## 2018-04-24 NOTE — Assessment & Plan Note (Signed)
Stable on CT scan from 6 months ago.  Recommended follow-up ultrasound in 2 years.

## 2018-04-24 NOTE — Assessment & Plan Note (Signed)
At goal.  Continue telmisartan 80 mg daily and metoprolol 50 mg twice daily.

## 2018-04-25 NOTE — Progress Notes (Signed)
Please inform patient of the following:  His "bad" cholesterol is a bit high and his "good" cholesterol is a bit low. I would like for him to increase his lipitor to 40mg  daily if he is willing. We should recheck again in 6-12 months.  His blood sugar level is elevated into the diabetic range. Normally would not need to treat with medications based on his level (goal A1c is less than 7 and his is right at 7) however given his kidney disease, I think it would be reasonable to start him on januvia 25mg  to lower his numbers and help protect his kidneys. Please send this in if he is willing to start. Typically well tolerated without side effects, however can potentially cause nausea for the first 1-2 weeks. He should continue working on eating a healthy/balanced diet and continue working on being active. We can recheck in 3-6 months.   Caleb Hicks. Jerline Pain, MD 04/25/2018 1:07 PM

## 2018-05-14 ENCOUNTER — Other Ambulatory Visit: Payer: Self-pay

## 2018-05-14 MED ORDER — SITAGLIPTIN PHOSPHATE 25 MG PO TABS: 25.0000 mg | ORAL_TABLET | Freq: Every day | ORAL | 1 refills | Status: DC

## 2018-05-14 MED ORDER — ATORVASTATIN CALCIUM 40 MG PO TABS: 40.0000 mg | ORAL_TABLET | Freq: Every day | ORAL | 3 refills | Status: AC

## 2018-11-05 ENCOUNTER — Other Ambulatory Visit: Payer: Self-pay

## 2018-11-05 DIAGNOSIS — N186 End stage renal disease: Secondary | ICD-10-CM

## 2018-11-07 ENCOUNTER — Telehealth (HOSPITAL_COMMUNITY): Payer: Self-pay | Admitting: Rehabilitation

## 2018-11-07 NOTE — Telephone Encounter (Signed)
The above patient or their representative was contacted and gave the following answers to these questions:         Do you have any of the following symptoms? No  Fever                    Cough                   Shortness of breath  Do  you have any of the following other symptoms? No   muscle pain         vomiting,        diarrhea        rash         weakness        red eye        abdominal pain         bruising          bruising or bleeding              joint pain           severe headache    Have you been in contact with someone who was or has been sick in the past 2 weeks? No  Yes                 Unsure                         Unable to assess   Does the person that you were in contact with have any of the following symptoms?   Cough         shortness of breath           muscle pain         vomiting,            diarrhea            rash            weakness           fever            red eye           abdominal pain           bruising  or  bleeding                joint pain                severe headache               Have you  or someone you have been in contact with traveled internationally in th last month? No        If yes, which countries?   Have you  or someone you have been in contact with traveled outside [place] in th last month? No         If yes, which state and city?   COMMENTS OR ACTION PLAN FOR THIS PATIENT:          

## 2018-11-08 ENCOUNTER — Other Ambulatory Visit: Payer: Self-pay

## 2018-11-08 ENCOUNTER — Ambulatory Visit (INDEPENDENT_AMBULATORY_CARE_PROVIDER_SITE_OTHER)
Admission: RE | Admit: 2018-11-08 | Discharge: 2018-11-08 | Disposition: A | Discharge status: Home / Self Care | Payer: Medicare Other | Source: Ambulatory Visit | Attending: Vascular Surgery | Admitting: Vascular Surgery

## 2018-11-08 ENCOUNTER — Encounter: Payer: Self-pay | Admitting: Vascular Surgery

## 2018-11-08 ENCOUNTER — Ambulatory Visit (HOSPITAL_COMMUNITY)
Admission: RE | Admit: 2018-11-08 | Discharge: 2018-11-08 | Disposition: A | Discharge status: Home / Self Care | Payer: Medicare Other | Source: Ambulatory Visit | Attending: Vascular Surgery | Admitting: Vascular Surgery

## 2018-11-08 ENCOUNTER — Encounter: Payer: Self-pay | Admitting: *Deleted

## 2018-11-08 ENCOUNTER — Ambulatory Visit (INDEPENDENT_AMBULATORY_CARE_PROVIDER_SITE_OTHER): Payer: Medicare Other | Admitting: Vascular Surgery

## 2018-11-08 ENCOUNTER — Other Ambulatory Visit: Payer: Self-pay | Admitting: *Deleted

## 2018-11-08 ENCOUNTER — Encounter (HOSPITAL_COMMUNITY): Payer: Self-pay | Admitting: *Deleted

## 2018-11-08 ENCOUNTER — Other Ambulatory Visit (HOSPITAL_COMMUNITY)
Admission: RE | Admit: 2018-11-08 | Discharge: 2018-11-08 | Disposition: A | Discharge status: Home / Self Care | Payer: Medicare Other | Source: Ambulatory Visit | Attending: Vascular Surgery | Admitting: Vascular Surgery

## 2018-11-08 VITALS — BP 141/71 | HR 58 | Temp 97.7°F | Resp 20 | Ht 66.0 in | Wt 156.3 lb

## 2018-11-08 DIAGNOSIS — N186 End stage renal disease: Secondary | ICD-10-CM

## 2018-11-08 DIAGNOSIS — Z1159 Encounter for screening for other viral diseases: Secondary | ICD-10-CM | POA: Diagnosis not present

## 2018-11-08 DIAGNOSIS — I868 Varicose veins of other specified sites: Secondary | ICD-10-CM | POA: Diagnosis not present

## 2018-11-08 DIAGNOSIS — N184 Chronic kidney disease, stage 4 (severe): Secondary | ICD-10-CM

## 2018-11-08 NOTE — H&P (View-Only) (Signed)
Patient ID: Caleb Hicks, male   DOB: 12-05-1940, 78 y.o.   MRN: [id number]  Reason for Consult: New Patient (Initial Visit)   Referred by [name], MD  Subjective:     HPI:  Caleb Hicks is a 78 y.o. male with history of end-stage renal disease previously was on dialysis via catheter.  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  He has not been back on dialysis.  Has never had fistula graft surgery.  No upper extremity chest or breast surgery.  No previous pacemakers or defibrillators.  Past Medical History:  Diagnosis Date  . AAA (abdominal aortic aneurysm) ([place]): 4 cm 03/24/2017   3.5X 3. 5 CM PER 10-6 ABDOMINAL CT FOLOOWED EVRY 2 YEARS   . Abdominal aneurysm ([place])   . Acute kidney injury superimposed on CKD [facility]): Stage 4 03/24/2017  . Diverticulitis 03/2017   1 UNIT BLOOD GIVEN  . Gout   . Hepatic cyst: Solitary 03/24/2017  . Hydrocele sac   . Hyperlipidemia 03/24/2017  . Hypertension   . Hyperuricemia 03/24/2017  . 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 Heart attack Father   . AAA (abdominal aortic aneurysm) Brother   . Cerebral aneurysm Brother    Past Surgical History:  Procedure Laterality Date  . CYST REMOVED FROM SPINE  YRS AGO  . HERNIA REPAIR     SEVERAL  . HYDROCELE EXCISION Bilateral 06/04/2017   Procedure: BILATERAL HYDROCELECTOMY ADULT;  Surgeon: Lucas Mallow, MD;  Location: [facility];  Service: Urology;  Laterality: Bilateral;  . PILIODINAL CYST  40-50 YRS AGO    Short Social History:  Social History   Tobacco Use  . Smoking status: Former Smoker    Packs/day: 2.00    Years: 20.00    Pack years: 40.00    Types: Cigarettes  . Smokeless tobacco: Never Used  . Tobacco comment: 1984  Substance Use Topics  . Alcohol use: No    Frequency: Never    Allergies  Allergen Reactions  . Norvasc [Amlodipine  Besylate] Other (See Comments)    Weight gain  . Tape Other (See Comments)    Tears skin    Current Outpatient Medications  Medication Sig Dispense Refill  . allopurinol (ZYLOPRIM) 100 MG tablet Take 100 mg by mouth 2 (two) times daily.  0  . aspirin 325 MG EC tablet Take 325 mg by mouth daily.    Marland Kitchen atorvastatin (LIPITOR) 40 MG tablet Take 1 tablet (40 mg total) by mouth daily. 90 tablet 3  . calcitRIOL (ROCALTROL) 0.25 MCG capsule Take 1 capsule (0.25 mcg total) by mouth daily. (Patient taking differently: Take 0.25 mcg by mouth every other day. ) 30 capsule 0  . calcium gluconate 500 MG tablet Take 1,000-1,500 mg by mouth See admin instructions. Take 1500 mg by mouth in the morning and 1000 mg by mouth at bedtime    . cycloSPORINE modified (NEORAL) 25 MG capsule Take 3 capsules (75 mg total) by mouth daily. (Patient taking differently: Take 50 mg by mouth 2 (two) times daily. ) 30 capsule 0  . furosemide (LASIX) 40 MG tablet Take 1 tablet (40 mg total) by mouth daily. (Patient taking differently: Take 80 mg by mouth daily. ) 30 tablet   . metoprolol tartrate (LOPRESSOR) 50 MG tablet Take 50 mg by mouth 2 (two) times daily.  0  .  predniSONE (DELTASONE) 5 MG tablet Take 5 mg by mouth daily.  0  . senna-docusate (SENOKOT-S) 8.6-50 MG tablet Take 1 tablet by mouth at bedtime. (Patient taking differently: Take 1 tablet by mouth at bedtime as needed for moderate constipation. )    . sitaGLIPtin (JANUVIA) 25 MG tablet Take 1 tablet (25 mg total) by mouth daily. 90 tablet 1  . telmisartan (MICARDIS) 80 MG tablet Take 1 tablet (80 mg total) by mouth daily.  2   No current facility-administered medications for this visit.     Review of Systems  Constitutional:  Constitutional negative. HENT: HENT negative.  Eyes: Eyes negative.  Respiratory: Respiratory negative.  Cardiovascular: Cardiovascular negative.  GI: Gastrointestinal negative.  Musculoskeletal: Musculoskeletal negative.  Skin: Skin  negative.  Neurological: Neurological negative. Hematologic: Hematologic/lymphatic negative.  Psychiatric: Psychiatric negative.        Objective:  Objective   Vitals:   11/08/18 1031  BP: (!) 141/71  Pulse: (!) 58  Resp: 20  Temp: 97.7 F (36.5 C)  SpO2: 100%  Weight: 156 lb 4.8 oz (70.9 kg)  Height: 5\' 6"  (1.676 m)   Body mass index is 25.23 kg/m.  Physical Exam HENT:     Head: Normocephalic.  Eyes:     Pupils: Pupils are equal, round, and reactive to light.  Cardiovascular:     Rate and Rhythm: Normal rate.     Pulses:          Radial pulses are 2+ on the right side and 2+ on the left side.  Pulmonary:     Effort: Pulmonary effort is normal.  Abdominal:     General: Abdomen is flat.     Palpations: Abdomen is soft.  Musculoskeletal: Normal range of motion.        General: No swelling.  Skin:    General: Skin is warm and dry.     Capillary Refill: Capillary refill takes less than 2 seconds.     Comments: Skin is very thin and demonstrates bruising  Neurological:     Mental Status: He is alert.  Psychiatric:        Mood and Affect: Mood normal.        Behavior: Behavior normal.        Thought Content: Thought content normal.        Judgment: Judgment normal.     Data: I have independently interpreted his bilateral upper extremity arterial duplex to have 0.38 cm right brachial artery and left brachial artery which are triphasic throughout all the way the radial and ulnar arteries.  I have also interpreted his bilateral upper extremity vein mapping which demonstrates the right cephalic vein measures 3.97 cm at the antecubitum but is down to 0.27 cm at the shoulder.  Right basilic similarly 6.73 and then 0.36.  Left cephalic vein does not appear suitable for use which is 0.15 cm in the distal upper arm.  Left basilic vein likely would be suitable based on size 0.35 cm at the antecubitum which remains at size throughout the upper arm.     Assessment/Plan:      78 year old male previously was on dialysis via catheter until he had a transplant many years ago.  Now transplant is failing.  He is indicated for fistula does not need catheter at this time.  Appears to have suitable basilic vein on the left.  I discussed with patient and his daughter this would be a two-stage procedure.  We have discussed the risk benefits and  alternatives to access.  They demonstrate good understanding we will get him scheduled.     Waynetta Sandy MD [facility]

## 2018-11-08 NOTE — Progress Notes (Signed)
Spoke with pt for pre-op call. Pt denies cardiac history. When asked if he was diabetic, he stated no. He said he has high blood sugar, I then asked if he was pre-diabetic and he said no. He states he's not had an A1C done and does not check his blood sugar at home. He does take Januvia daily.   Pt had the Covid 19 testing done today. Pt is quaranting himself until day of surgery.   Coronavirus Screening  Have you experienced the following symptoms:  Cough NO Fever (>100.29F) NO Runny nose NO Sore throat NO Difficulty breathing/shortness of breath  NO  Have you or a family member traveled in the last 14 days and where? NO    Patient reminded that hospital visitation restrictions are in effect and the importance of the restrictions.

## 2018-11-08 NOTE — Progress Notes (Signed)
Patient ID: Caleb Hicks, male   DOB: Jun 10, 1941, 78 y.o.   MRN: [id number]  Reason for Consult: New Patient (Initial Visit)   Referred by [name], MD  Subjective:     HPI:  Caleb Hicks is a 78 y.o. male with history of end-stage renal disease previously was on dialysis via catheter.  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  He has not been back on dialysis.  Has never had fistula graft surgery.  No upper extremity chest or breast surgery.  No previous pacemakers or defibrillators.  Past Medical History:  Diagnosis Date  . AAA (abdominal aortic aneurysm) ([place]): 4 cm 03/24/2017   3.5X 3. 5 CM PER 10-6 ABDOMINAL CT FOLOOWED EVRY 2 YEARS   . Abdominal aneurysm ([place])   . Acute kidney injury superimposed on CKD [facility]): Stage 4 03/24/2017  . Diverticulitis 03/2017   1 UNIT BLOOD GIVEN  . Gout   . Hepatic cyst: Solitary 03/24/2017  . Hydrocele sac   . Hyperlipidemia 03/24/2017  . Hypertension   . Hyperuricemia 03/24/2017  . 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 Heart attack Father   . AAA (abdominal aortic aneurysm) Brother   . Cerebral aneurysm Brother    Past Surgical History:  Procedure Laterality Date  . CYST REMOVED FROM SPINE  YRS AGO  . HERNIA REPAIR     SEVERAL  . HYDROCELE EXCISION Bilateral 06/04/2017   Procedure: BILATERAL HYDROCELECTOMY ADULT;  Surgeon: Lucas Mallow, MD;  Location: [facility];  Service: Urology;  Laterality: Bilateral;  . PILIODINAL CYST  40-50 YRS AGO    Short Social History:  Social History   Tobacco Use  . Smoking status: Former Smoker    Packs/day: 2.00    Years: 20.00    Pack years: 40.00    Types: Cigarettes  . Smokeless tobacco: Never Used  . Tobacco comment: 1984  Substance Use Topics  . Alcohol use: No    Frequency: Never    Allergies  Allergen Reactions  . Norvasc [Amlodipine  Besylate] Other (See Comments)    Weight gain  . Tape Other (See Comments)    Tears skin    Current Outpatient Medications  Medication Sig Dispense Refill  . allopurinol (ZYLOPRIM) 100 MG tablet Take 100 mg by mouth 2 (two) times daily.  0  . aspirin 325 MG EC tablet Take 325 mg by mouth daily.    Marland Kitchen atorvastatin (LIPITOR) 40 MG tablet Take 1 tablet (40 mg total) by mouth daily. 90 tablet 3  . calcitRIOL (ROCALTROL) 0.25 MCG capsule Take 1 capsule (0.25 mcg total) by mouth daily. (Patient taking differently: Take 0.25 mcg by mouth every other day. ) 30 capsule 0  . calcium gluconate 500 MG tablet Take 1,000-1,500 mg by mouth See admin instructions. Take 1500 mg by mouth in the morning and 1000 mg by mouth at bedtime    . cycloSPORINE modified (NEORAL) 25 MG capsule Take 3 capsules (75 mg total) by mouth daily. (Patient taking differently: Take 50 mg by mouth 2 (two) times daily. ) 30 capsule 0  . furosemide (LASIX) 40 MG tablet Take 1 tablet (40 mg total) by mouth daily. (Patient taking differently: Take 80 mg by mouth daily. ) 30 tablet   . metoprolol tartrate (LOPRESSOR) 50 MG tablet Take 50 mg by mouth 2 (two) times daily.  0  .  predniSONE (DELTASONE) 5 MG tablet Take 5 mg by mouth daily.  0  . senna-docusate (SENOKOT-S) 8.6-50 MG tablet Take 1 tablet by mouth at bedtime. (Patient taking differently: Take 1 tablet by mouth at bedtime as needed for moderate constipation. )    . sitaGLIPtin (JANUVIA) 25 MG tablet Take 1 tablet (25 mg total) by mouth daily. 90 tablet 1  . telmisartan (MICARDIS) 80 MG tablet Take 1 tablet (80 mg total) by mouth daily.  2   No current facility-administered medications for this visit.     Review of Systems  Constitutional:  Constitutional negative. HENT: HENT negative.  Eyes: Eyes negative.  Respiratory: Respiratory negative.  Cardiovascular: Cardiovascular negative.  GI: Gastrointestinal negative.  Musculoskeletal: Musculoskeletal negative.  Skin: Skin  negative.  Neurological: Neurological negative. Hematologic: Hematologic/lymphatic negative.  Psychiatric: Psychiatric negative.        Objective:  Objective   Vitals:   11/08/18 1031  BP: (!) 141/71  Pulse: (!) 58  Resp: 20  Temp: 97.7 F (36.5 C)  SpO2: 100%  Weight: 156 lb 4.8 oz (70.9 kg)  Height: 5\' 6"  (1.676 m)   Body mass index is 25.23 kg/m.  Physical Exam HENT:     Head: Normocephalic.  Eyes:     Pupils: Pupils are equal, round, and reactive to light.  Cardiovascular:     Rate and Rhythm: Normal rate.     Pulses:          Radial pulses are 2+ on the right side and 2+ on the left side.  Pulmonary:     Effort: Pulmonary effort is normal.  Abdominal:     General: Abdomen is flat.     Palpations: Abdomen is soft.  Musculoskeletal: Normal range of motion.        General: No swelling.  Skin:    General: Skin is warm and dry.     Capillary Refill: Capillary refill takes less than 2 seconds.     Comments: Skin is very thin and demonstrates bruising  Neurological:     Mental Status: He is alert.  Psychiatric:        Mood and Affect: Mood normal.        Behavior: Behavior normal.        Thought Content: Thought content normal.        Judgment: Judgment normal.     Data: I have independently interpreted his bilateral upper extremity arterial duplex to have 0.38 cm right brachial artery and left brachial artery which are triphasic throughout all the way the radial and ulnar arteries.  I have also interpreted his bilateral upper extremity vein mapping which demonstrates the right cephalic vein measures 2.42 cm at the antecubitum but is down to 0.27 cm at the shoulder.  Right basilic similarly 3.53 and then 0.36.  Left cephalic vein does not appear suitable for use which is 0.15 cm in the distal upper arm.  Left basilic vein likely would be suitable based on size 0.35 cm at the antecubitum which remains at size throughout the upper arm.     Assessment/Plan:      78 year old male previously was on dialysis via catheter until he had a transplant many years ago.  Now transplant is failing.  He is indicated for fistula does not need catheter at this time.  Appears to have suitable basilic vein on the left.  I discussed with patient and his daughter this would be a two-stage procedure.  We have discussed the risk benefits and  alternatives to access.  They demonstrate good understanding we will get him scheduled.     Waynetta Sandy MD [facility]

## 2018-11-09 LAB — NOVEL CORONAVIRUS, NAA (HOSP ORDER, SEND-OUT TO REF LAB; TAT 18-24 HRS): SARS-CoV-2, NAA: NOT DETECTED

## 2018-11-12 ENCOUNTER — Other Ambulatory Visit: Payer: Self-pay

## 2018-11-12 ENCOUNTER — Ambulatory Visit (HOSPITAL_COMMUNITY): Payer: Medicare Other | Admitting: Certified Registered Nurse Anesthetist

## 2018-11-12 ENCOUNTER — Encounter (HOSPITAL_COMMUNITY)
Admission: RE | Disposition: A | Discharge status: Home / Self Care | Payer: Self-pay | Source: Home / Self Care | Attending: Vascular Surgery

## 2018-11-12 ENCOUNTER — Ambulatory Visit (HOSPITAL_COMMUNITY)
Admission: RE | Admit: 2018-11-12 | Discharge: 2018-11-12 | Disposition: A | Discharge status: Home / Self Care | Payer: Medicare Other | Attending: Vascular Surgery | Admitting: Vascular Surgery

## 2018-11-12 ENCOUNTER — Encounter (HOSPITAL_COMMUNITY): Payer: Self-pay | Admitting: Certified Registered Nurse Anesthetist

## 2018-11-12 DIAGNOSIS — Z7982 Long term (current) use of aspirin: Secondary | ICD-10-CM | POA: Insufficient documentation

## 2018-11-12 DIAGNOSIS — Z7952 Long term (current) use of systemic steroids: Secondary | ICD-10-CM | POA: Insufficient documentation

## 2018-11-12 DIAGNOSIS — Z992 Dependence on renal dialysis: Secondary | ICD-10-CM | POA: Diagnosis not present

## 2018-11-12 DIAGNOSIS — I120 Hypertensive chronic kidney disease with stage 5 chronic kidney disease or end stage renal disease: Secondary | ICD-10-CM | POA: Diagnosis not present

## 2018-11-12 DIAGNOSIS — I714 Abdominal aortic aneurysm, without rupture: Secondary | ICD-10-CM | POA: Insufficient documentation

## 2018-11-12 DIAGNOSIS — M109 Gout, unspecified: Secondary | ICD-10-CM | POA: Insufficient documentation

## 2018-11-12 DIAGNOSIS — Z7984 Long term (current) use of oral hypoglycemic drugs: Secondary | ICD-10-CM | POA: Insufficient documentation

## 2018-11-12 DIAGNOSIS — N186 End stage renal disease: Secondary | ICD-10-CM | POA: Diagnosis not present

## 2018-11-12 DIAGNOSIS — E785 Hyperlipidemia, unspecified: Secondary | ICD-10-CM | POA: Diagnosis not present

## 2018-11-12 DIAGNOSIS — Z87891 Personal history of nicotine dependence: Secondary | ICD-10-CM | POA: Diagnosis not present

## 2018-11-12 DIAGNOSIS — Z79899 Other long term (current) drug therapy: Secondary | ICD-10-CM | POA: Insufficient documentation

## 2018-11-12 DIAGNOSIS — E1122 Type 2 diabetes mellitus with diabetic chronic kidney disease: Secondary | ICD-10-CM | POA: Diagnosis not present

## 2018-11-12 DIAGNOSIS — N185 Chronic kidney disease, stage 5: Secondary | ICD-10-CM

## 2018-11-12 DIAGNOSIS — Z940 Kidney transplant status: Secondary | ICD-10-CM | POA: Diagnosis not present

## 2018-11-12 HISTORY — PX: AV FISTULA PLACEMENT: SHX1204

## 2018-11-12 HISTORY — DX: Inflammatory liver disease, unspecified: K75.9

## 2018-11-12 HISTORY — DX: Anemia, unspecified: D64.9

## 2018-11-12 HISTORY — DX: Prediabetes: R73.03

## 2018-11-12 HISTORY — DX: Malignant (primary) neoplasm, unspecified: C80.1

## 2018-11-12 HISTORY — DX: Unspecified osteoarthritis, unspecified site: M19.90

## 2018-11-12 LAB — POCT I-STAT 4, (NA,K, GLUC, HGB,HCT)
Glucose, Bld: 109 mg/dL — ABNORMAL HIGH (ref 70–99)
HCT: 29 % — ABNORMAL LOW (ref 39.0–52.0)
Hemoglobin: 9.9 g/dL — ABNORMAL LOW (ref 13.0–17.0)
Potassium: 4.6 mmol/L (ref 3.5–5.1)
Sodium: 137 mmol/L (ref 135–145)

## 2018-11-12 LAB — GLUCOSE, CAPILLARY
Glucose-Capillary: 120 mg/dL — ABNORMAL HIGH (ref 70–99)
Glucose-Capillary: 134 mg/dL — ABNORMAL HIGH (ref 70–99)

## 2018-11-12 SURGERY — ARTERIOVENOUS (AV) FISTULA CREATION
Anesthesia: Monitor Anesthesia Care | Site: Arm Upper | Laterality: Left

## 2018-11-12 MED ORDER — ONDANSETRON HCL 4 MG/2ML IJ SOLN
INTRAMUSCULAR | Status: DC | PRN
  Administered 2018-11-12: 4 mg via INTRAVENOUS

## 2018-11-12 MED ORDER — LIDOCAINE-EPINEPHRINE 1 %-1:100000 IJ SOLN
INTRAMUSCULAR | Status: AC
  Filled 2018-11-12: qty 1

## 2018-11-12 MED ORDER — OXYCODONE HCL 5 MG/5ML PO SOLN: 5.0000 mg | Freq: Once | ORAL | Status: DC | PRN

## 2018-11-12 MED ORDER — SODIUM CHLORIDE 0.9 % IV SOLN
INTRAVENOUS | Status: DC | PRN
  Administered 2018-11-12 (×2): via INTRAVENOUS

## 2018-11-12 MED ORDER — FENTANYL CITRATE (PF) 100 MCG/2ML IJ SOLN: 25.0000 ug | INTRAMUSCULAR | Status: DC | PRN

## 2018-11-12 MED ORDER — LIDOCAINE 2% (20 MG/ML) 5 ML SYRINGE
INTRAMUSCULAR | Status: AC
  Filled 2018-11-12: qty 5

## 2018-11-12 MED ORDER — 0.9 % SODIUM CHLORIDE (POUR BTL) OPTIME
TOPICAL | Status: DC | PRN
  Administered 2018-11-12: 1000 mL

## 2018-11-12 MED ORDER — OXYCODONE HCL 5 MG PO TABS: 5.0000 mg | ORAL_TABLET | Freq: Once | ORAL | Status: DC | PRN

## 2018-11-12 MED ORDER — SODIUM CHLORIDE 0.9 % IV SOLN
INTRAVENOUS | Status: AC
  Filled 2018-11-12: qty 1.2

## 2018-11-12 MED ORDER — SODIUM CHLORIDE 0.9 % IV SOLN
INTRAVENOUS | Status: DC
  Administered 2018-11-12 (×2): via INTRAVENOUS

## 2018-11-12 MED ORDER — EPHEDRINE SULFATE-NACL 50-0.9 MG/10ML-% IV SOSY
PREFILLED_SYRINGE | INTRAVENOUS | Status: DC | PRN
  Administered 2018-11-12: 10 mg via INTRAVENOUS

## 2018-11-12 MED ORDER — CEFAZOLIN SODIUM-DEXTROSE 2-4 GM/100ML-% IV SOLN
2.0000 g | INTRAVENOUS | Status: AC
  Administered 2018-11-12: 2 g via INTRAVENOUS
  Filled 2018-11-12: qty 100

## 2018-11-12 MED ORDER — ONDANSETRON HCL 4 MG/2ML IJ SOLN: 4.0000 mg | Freq: Once | INTRAMUSCULAR | Status: DC | PRN

## 2018-11-12 MED ORDER — LIDOCAINE-EPINEPHRINE (PF) 1 %-1:200000 IJ SOLN
INTRAMUSCULAR | Status: DC | PRN
  Administered 2018-11-12: 7 mL

## 2018-11-12 MED ORDER — FENTANYL CITRATE (PF) 100 MCG/2ML IJ SOLN
INTRAMUSCULAR | Status: DC | PRN
  Administered 2018-11-12 (×2): 25 ug via INTRAVENOUS

## 2018-11-12 MED ORDER — PROPOFOL 10 MG/ML IV BOLUS
INTRAVENOUS | Status: AC
  Filled 2018-11-12: qty 20

## 2018-11-12 MED ORDER — LIDOCAINE-EPINEPHRINE (PF) 1 %-1:200000 IJ SOLN
INTRAMUSCULAR | Status: AC
  Filled 2018-11-12: qty 30

## 2018-11-12 MED ORDER — PROPOFOL 500 MG/50ML IV EMUL
INTRAVENOUS | Status: DC | PRN
  Administered 2018-11-12: 50 ug/kg/min via INTRAVENOUS

## 2018-11-12 MED ORDER — OXYCODONE-ACETAMINOPHEN 5-325 MG PO TABS: 1.0000 | ORAL_TABLET | Freq: Four times a day (QID) | ORAL | 0 refills | Status: DC | PRN

## 2018-11-12 MED ORDER — MIDAZOLAM HCL 2 MG/2ML IJ SOLN
INTRAMUSCULAR | Status: AC
  Filled 2018-11-12: qty 2

## 2018-11-12 MED ORDER — MIDAZOLAM HCL 5 MG/5ML IJ SOLN
INTRAMUSCULAR | Status: DC | PRN
  Administered 2018-11-12: 1 mg via INTRAVENOUS

## 2018-11-12 MED ORDER — ONDANSETRON HCL 4 MG/2ML IJ SOLN
INTRAMUSCULAR | Status: AC
  Filled 2018-11-12: qty 2

## 2018-11-12 MED ORDER — FENTANYL CITRATE (PF) 250 MCG/5ML IJ SOLN
INTRAMUSCULAR | Status: AC
  Filled 2018-11-12: qty 5

## 2018-11-12 MED ORDER — SODIUM CHLORIDE 0.9 % IV SOLN
INTRAVENOUS | Status: DC | PRN
  Administered 2018-11-12: 500 mL

## 2018-11-12 SURGICAL SUPPLY — 28 items
ARMBAND PINK RESTRICT EXTREMIT (MISCELLANEOUS) ×3 IMPLANT
CANISTER SUCT 3000ML PPV (MISCELLANEOUS) ×3 IMPLANT
CLIP VESOCCLUDE MED 6/CT (CLIP) ×5 IMPLANT
CLIP VESOCCLUDE SM WIDE 6/CT (CLIP) ×3 IMPLANT
COVER PROBE W GEL 5X96 (DRAPES) ×2 IMPLANT
COVER WAND RF STERILE (DRAPES) ×3 IMPLANT
DERMABOND ADVANCED (GAUZE/BANDAGES/DRESSINGS) ×2
DERMABOND ADVANCED .7 DNX12 (GAUZE/BANDAGES/DRESSINGS) ×1 IMPLANT
ELECT REM PT RETURN 9FT ADLT (ELECTROSURGICAL) ×3
ELECTRODE REM PT RTRN 9FT ADLT (ELECTROSURGICAL) ×1 IMPLANT
GLOVE BIO SURGEON STRL SZ7.5 (GLOVE) ×3 IMPLANT
GOWN STRL REUS W/ TWL LRG LVL3 (GOWN DISPOSABLE) ×2 IMPLANT
GOWN STRL REUS W/ TWL XL LVL3 (GOWN DISPOSABLE) ×1 IMPLANT
GOWN STRL REUS W/TWL LRG LVL3 (GOWN DISPOSABLE) ×6
GOWN STRL REUS W/TWL XL LVL3 (GOWN DISPOSABLE) ×2
INSERT FOGARTY SM (MISCELLANEOUS) ×2 IMPLANT
KIT BASIN OR (CUSTOM PROCEDURE TRAY) ×3 IMPLANT
KIT TURNOVER KIT B (KITS) ×3 IMPLANT
NS IRRIG 1000ML POUR BTL (IV SOLUTION) ×3 IMPLANT
PACK CV ACCESS (CUSTOM PROCEDURE TRAY) ×3 IMPLANT
PAD ARMBOARD 7.5X6 YLW CONV (MISCELLANEOUS) ×6 IMPLANT
SUT MNCRL AB 4-0 PS2 18 (SUTURE) ×3 IMPLANT
SUT PROLENE 6 0 BV (SUTURE) ×9 IMPLANT
SUT VIC AB 3-0 SH 27 (SUTURE) ×2
SUT VIC AB 3-0 SH 27X BRD (SUTURE) ×1 IMPLANT
TOWEL GREEN STERILE (TOWEL DISPOSABLE) ×3 IMPLANT
UNDERPAD 30X30 (UNDERPADS AND DIAPERS) ×3 IMPLANT
WATER STERILE IRR 1000ML POUR (IV SOLUTION) ×3 IMPLANT

## 2018-11-12 NOTE — Anesthesia Preprocedure Evaluation (Addendum)
Anesthesia Evaluation  Patient identified by MRN, date of birth, ID band Patient awake    Reviewed: Allergy & Precautions, NPO status , Patient's Chart, lab work & pertinent test results  History of Anesthesia Complications Negative for: history of anesthetic complications  Airway Mallampati: II  TM Distance: >3 FB Neck ROM: Full    Dental  (+) Dental Advisory Given, Teeth Intact   Pulmonary former smoker,    breath sounds clear to auscultation       Cardiovascular hypertension, Pt. on medications and Pt. on home beta blockers  Rhythm:Regular Rate:Normal   '19 TTE - moderate LVH. EF 65% to 70%. Grade 1 diastolic dysfunction. Mild AI. Mildly dilated LA.  '19 CT Abd/Pelvis - Stable 3.5 cm infrarenal AAA.    Neuro/Psych negative neurological ROS  negative psych ROS   GI/Hepatic negative GI ROS, (+) Hepatitis -, A  Endo/Other  diabetes, Type 2, Oral Hypoglycemic Agents  Renal/GU ESRFRenal disease S/p renal transplant      Musculoskeletal  (+) Arthritis ,  Gout    Abdominal   Peds  Hematology  (+) anemia ,   Anesthesia Other Findings   Reproductive/Obstetrics                           Anesthesia Physical Anesthesia Plan  ASA: IV  Anesthesia Plan: MAC   Post-op Pain Management:    Induction: Intravenous  PONV Risk Score and Plan: 1 and Propofol infusion and Treatment may vary due to age or medical condition  Airway Management Planned: Natural Airway and Simple Face Mask  Additional Equipment: None  Intra-op Plan:   Post-operative Plan:   Informed Consent: I have reviewed the patients History and Physical, chart, labs and discussed the procedure including the risks, benefits and alternatives for the proposed anesthesia with the patient or authorized representative who has indicated his/her understanding and acceptance.       Plan Discussed with: CRNA and  Anesthesiologist  Anesthesia Plan Comments:        Anesthesia Quick Evaluation

## 2018-11-12 NOTE — Anesthesia Postprocedure Evaluation (Signed)
Anesthesia Post Note  Patient: Caleb Hicks  Procedure(s) Performed: BRACHIOBASILIC ARTERIOVENOUS (AV) FISTULA CREATION LEFT ARM (Left Arm Upper)     Patient location during evaluation: PACU Anesthesia Type: MAC Level of consciousness: awake and alert Pain management: pain level controlled Vital Signs Assessment: post-procedure vital signs reviewed and stable Respiratory status: spontaneous breathing, nonlabored ventilation and respiratory function stable Cardiovascular status: stable and blood pressure returned to baseline Anesthetic complications: no    Last Vitals:  Vitals:   11/12/18 1617 11/12/18 1619  BP:  (!) 166/61  Pulse: (!) 55 (!) 46  Resp: (!) 21   Temp: (!) 36.1 C   SpO2: 100% 100%    Last Pain:  Vitals:   11/12/18 1617  TempSrc:   PainSc: 0-No pain                 Audry Pili

## 2018-11-12 NOTE — Transfer of Care (Signed)
Immediate Anesthesia Transfer of Care Note  Patient: Caleb Hicks  Procedure(s) Performed: BRACHIOBASILIC ARTERIOVENOUS (AV) FISTULA CREATION LEFT ARM (Left Arm Upper)  Patient Location: PACU  Anesthesia Type:MAC  Level of Consciousness: drowsy and patient cooperative  Airway & Oxygen Therapy: Patient Spontanous Breathing  Post-op Assessment: Report given to RN and Post -op Vital signs reviewed and stable  Post vital signs: Reviewed and stable  Last Vitals:  Vitals Value Taken Time  BP    Temp    Pulse 56 11/12/2018  3:53 PM  Resp 18 11/12/2018  3:53 PM  SpO2 98 % 11/12/2018  3:53 PM  Vitals shown include unvalidated device data.  Last Pain:  Vitals:   11/12/18 1006  TempSrc:   PainSc: 0-No pain         Complications: No apparent anesthesia complications

## 2018-11-12 NOTE — Discharge Instructions (Signed)
° °  [facility] ° °Discharge Instructions ° °AV Fistula or Graft Surgery for Dialysis Access ° °Please refer to the following instructions for your post-procedure care. Your surgeon or physician assistant will discuss any changes with you. ° °Activity ° °You may drive the day following your surgery, if you are comfortable and no longer taking prescription pain medication. Resume full activity as the soreness in your incision resolves. ° °Bathing/Showering ° °You may shower after you go home. Keep your incision dry for 48 hours. Do not soak in a bathtub, hot tub, or swim until the incision heals completely. You may not shower if you have a hemodialysis catheter. ° °Incision Care ° °Clean your incision with mild soap and water after 48 hours. Pat the area dry with a clean towel. You do not need a bandage unless otherwise instructed. Do not apply any ointments or creams to your incision. You may have skin glue on your incision. Do not peel it off. It will come off on its own in about one week. Your arm may swell a bit after surgery. To reduce swelling use pillows to elevate your arm so it is above your heart. Your doctor will tell you if you need to lightly wrap your arm with an ACE bandage. ° °Diet ° °Resume your normal diet. There are not special food restrictions following this procedure. In order to heal from your surgery, it is CRITICAL to get adequate nutrition. Your body requires vitamins, minerals, and protein. Vegetables are the best source of vitamins and minerals. Vegetables also provide the perfect balance of protein. Processed food has little nutritional value, so try to avoid this. ° °Medications ° °Resume taking all of your medications. If your incision is causing pain, you may take over-the counter pain relievers such as acetaminophen (Tylenol). If you were prescribed a stronger pain medication, please be aware these medications can cause nausea and constipation. Prevent  nausea by taking the medication with a snack or meal. Avoid constipation by drinking plenty of fluids and eating foods with high amount of fiber, such as fruits, vegetables, and grains. Do not take Tylenol if you are taking prescription pain medications. ° ° ° ° °Follow up °Your surgeon may want to see you in the office following your access surgery. If so, this will be arranged at the time of your surgery. ° °Please call us immediately for any of the following conditions: ° °Increased pain, redness, drainage (pus) from your incision site °Fever of 101 degrees or higher °Severe or worsening pain at your incision site °Hand pain or numbness. ° °Reduce your risk of vascular disease: ° °Stop smoking. If you would like help, call [facility] at 1-800-QUIT-NOW (1-800-784-8669) or [place] at 336-586-4000 ° °Manage your cholesterol °Maintain a desired weight °Control your diabetes °Keep your blood pressure down ° °Dialysis ° °It will take several weeks to several months for your new dialysis access to be ready for use. Your surgeon will determine when it is OK to use it. Your nephrologist will continue to direct your dialysis. You can continue to use your Permcath until your new access is ready for use. ° °If you have any questions, please call the office at [phone]. ° °

## 2018-11-12 NOTE — Progress Notes (Addendum)
Pt took Aspirin 325mg  this morning, DOS, @ 0830.  Called into OR, to let Dr. Donzetta Matters know.  Advised OR nurse to call back if there was an issue.  Dr. Donzetta Matters ok with pt taking aspirin.

## 2018-11-12 NOTE — H&P (Signed)
   History and Physical Update  The patient was interviewed and re-examined.  The patient's previous History and Physical has been reviewed and is unchanged from recent office visit. Plan for left arm avf today in OR.   Jaclynne Baldo C. Donzetta Matters, MD [facility] Office: [phone] Pager: [phone]  11/12/2018, 1:44 PM

## 2018-11-12 NOTE — Interval H&P Note (Signed)
History and Physical Interval Note:  11/12/2018 1:50 PM  Caleb Hicks  has presented today for surgery, with the diagnosis of chronic kidney disease stage 5.  The various methods of treatment have been discussed with the patient and family. After consideration of risks, benefits and other options for treatment, the patient has consented to  Procedure(s): ARTERIOVENOUS (AV) FISTULA CREATION LEFT ARM (Left) as a surgical intervention.  The patient's history has been reviewed, patient examined, no change in status, stable for surgery.  I have reviewed the patient's chart and labs.  Questions were answered to the patient's satisfaction.     [name]

## 2018-11-12 NOTE — Op Note (Signed)
° ° °  Patient name: Caleb Hicks MRN: [id number] DOB: 11-Sep-1940 Sex: male  11/12/2018 Pre-operative Diagnosis: Chronic kidney disease Post-operative diagnosis:  Same Surgeon:  Eda Paschal. Donzetta Matters, MD Assistant: Laurence Slate, PA Procedure Performed:  Left upper arm for stage brachiobasilic AV fistula creation  Indications: 78 year old male that is right-hand dominant previous history of kidney transplant.  He is now indicated for dialysis access appears to have suitable basilic vein on the left.  Findings: Basilic vein by ultrasound measured approximately 4 mm.  This vein was quite thin.  After initial fistula creation there was a tear in the vein we trimmed this further back mobilized our vein of the centimeters performed a new brachial to basilic vein fistula which did have strong thrill and there was a palpable radial pulse at completion on the left.   Procedure:  The patient was identified in the holding area and taken to the operating room where is placed supine operative table and MAC anesthesia induced.  New sterilely prepped draped in left upper extremity usual fashion antibiotics were administered and a timeout was called.  Ultrasound was used to identify the basilic vein there was no suitable cephalic vein on the left.  We also identified the brachial artery which was readily palpable.  The area was anesthetized 1% lidocaine with epinephrine.  Curvilinear incision was made between the 2.  We dissected out the vein protected the nerve and marked for orientation.  The artery was then dissected down vessel loop was placed around this.  The vein was then clipped distally and transected dilated up to 3-1/2 mm flushed with heparinized saline.  The artery was clamped distally proximally opened longitudinally flushed heparinized saline both directions.  Vein was spatulated sewn end-to-side with 6-0 Prolene suture.  Prior to completion allowed flushing all directions.  Upon completion there was a tear in  our vein at the toe.  I attempted to repair this but ultimately the vein appeared to friable.  I reclamped the artery.  I trimmed the vein to size.  I did mobilize it by dividing to further branch between clips and ties.  I again spatulated it.  The artery was again flushed with heparinized saline both directions.  Vein again sewn end-to-side with 6-0 Prolene suture.  Again prior to completion anastomosis we allowed flushing all directions.  This time upon completion we had good flow there was a good thrill in our vein.  Palpable radial pulse the wrist both were confirmed with Doppler.  Satisfied we irrigated the wound obtain hemostasis closed in layers with Vicryl and Monocryl.  Dermabond was placed to level skin.  He was awakened anesthesia having tolerated procedure without immediate complication.  All counts were correct at completion.  EBL: 50 cc   Salvatore Shear C. Donzetta Matters, MD [facility] Office: [phone] Pager: [phone]

## 2018-11-13 ENCOUNTER — Encounter (HOSPITAL_COMMUNITY): Payer: Self-pay | Admitting: Vascular Surgery

## 2018-11-13 ENCOUNTER — Telehealth: Payer: Self-pay

## 2018-11-13 NOTE — Telephone Encounter (Signed)
Daughter called and had questions about his arm. She said that he just went home but his arm is really red and bruised. She said that he bruises anyway and his skin is really thin. She said that she just wanted to talk and discuss what was normal.   Spoke with daughter and he is not complaining of any pain. She said that he does not have any drainage from his arm and that he is not running any fever. She said that it just looks bad in her opinion because of all of the bruising. Advised her that this was ok and that he should elevate his arm as he could and wiggle his fingers. Discussed things that would be concerning to look out for and she will call if needed.   We discussed how to access mychart as well and if it does not improve she will send a picture of his arm.   York Cerise, CMA

## 2018-12-10 ENCOUNTER — Other Ambulatory Visit: Payer: Self-pay

## 2018-12-10 DIAGNOSIS — N186 End stage renal disease: Secondary | ICD-10-CM

## 2018-12-13 ENCOUNTER — Encounter: Payer: Self-pay | Admitting: *Deleted

## 2018-12-13 ENCOUNTER — Other Ambulatory Visit: Payer: Self-pay | Admitting: *Deleted

## 2018-12-13 ENCOUNTER — Other Ambulatory Visit: Payer: Self-pay

## 2018-12-13 ENCOUNTER — Ambulatory Visit (HOSPITAL_COMMUNITY)
Admission: RE | Admit: 2018-12-13 | Discharge: 2018-12-13 | Disposition: A | Discharge status: Home / Self Care | Payer: Medicare Other | Source: Ambulatory Visit | Attending: Vascular Surgery | Admitting: Vascular Surgery

## 2018-12-13 ENCOUNTER — Ambulatory Visit (INDEPENDENT_AMBULATORY_CARE_PROVIDER_SITE_OTHER): Payer: Medicare Other | Admitting: Physician Assistant

## 2018-12-13 VITALS — BP 124/60 | HR 60 | Temp 98.5°F | Resp 16 | Ht 72.0 in | Wt 157.0 lb

## 2018-12-13 DIAGNOSIS — N184 Chronic kidney disease, stage 4 (severe): Secondary | ICD-10-CM

## 2018-12-13 DIAGNOSIS — N186 End stage renal disease: Secondary | ICD-10-CM | POA: Diagnosis not present

## 2018-12-13 NOTE — Progress Notes (Signed)
Established Dialysis Access   History of Present Illness   Caleb Hicks is a 78 y.o. (04-30-1941) male who presents for re-evaluation of left arm fistula.  He is status post for stage basilic vein transposition by Dr. Donzetta Matters on 11/12/2018.  Patient states left antecubitum incision is well-healed.  He denies any signs or symptoms of a steal syndrome in his left hand.  He is aware he will require a second stage surgery prior to accessing left arm AV fistula.  He is also willing to proceed with the second stage surgery.  Based on fistula duplex today the fistula has matured and is ready for second stage.  He is taking aspirin daily.  He denies any fevers, chills, nausea/vomiting.  He is not yet on hemodialysis.  Current Outpatient Medications  Medication Sig Dispense Refill  . allopurinol (ZYLOPRIM) 100 MG tablet Take 200 mg by mouth daily.   0  . aspirin 325 MG EC tablet Take 325 mg by mouth daily.    . calcitRIOL (ROCALTROL) 0.25 MCG capsule Take 1 capsule (0.25 mcg total) by mouth daily. (Patient taking differently: Take 0.25 mcg by mouth every other day. ) 30 capsule 0  . calcium carbonate (OS-CAL - DOSED IN MG OF ELEMENTAL CALCIUM) 1250 (500 Ca) MG tablet Take 1-2 tablets by mouth See admin instructions. 2 tablets in the morning, and 1 tablet in the evening    . cycloSPORINE modified (NEORAL) 25 MG capsule Take 3 capsules (75 mg total) by mouth daily. (Patient taking differently: Take 50 mg by mouth 2 (two) times daily. ) 30 capsule 0  . furosemide (LASIX) 40 MG tablet Take 1 tablet (40 mg total) by mouth daily. 30 tablet   . metoprolol tartrate (LOPRESSOR) 50 MG tablet Take 50 mg by mouth 2 (two) times daily.  0  . predniSONE (DELTASONE) 5 MG tablet Take 5 mg by mouth daily.  0  . senna-docusate (SENOKOT-S) 8.6-50 MG tablet Take 1 tablet by mouth at bedtime. (Patient taking differently: Take 1 tablet by mouth at bedtime as needed for moderate constipation. )    . sitaGLIPtin (JANUVIA) 25 MG  tablet Take 1 tablet (25 mg total) by mouth daily. 90 tablet 1  . sodium bicarbonate 650 MG tablet Take 1,300 mg by mouth 2 (two) times daily.    Marland Kitchen telmisartan (MICARDIS) 80 MG tablet Take 1 tablet (80 mg total) by mouth daily.  2  . atorvastatin (LIPITOR) 40 MG tablet Take 1 tablet (40 mg total) by mouth daily. (Patient not taking: Reported on 12/13/2018) 90 tablet 3  . oxyCODONE-acetaminophen (PERCOCET/ROXICET) 5-325 MG tablet Take 1 tablet by mouth every 6 (six) hours as needed. (Patient not taking: Reported on 12/13/2018) 6 tablet 0   No current facility-administered medications for this visit.     On ROS today: 10 system ROS is negative unless otherwise noted in HPI   Physical Examination   Vitals:   12/13/18 1438  BP: 124/60  Pulse: 60  Resp: 16  Temp: 98.5 F (36.9 C)  TempSrc: Temporal  SpO2: 100%  Weight: 157 lb (71.2 kg)  Height: 6' (1.829 m)   Body mass index is 21.29 kg/m.  General Alert, O x 3, WD, NAD  Pulmonary Sym exp, good B air movt, CTA B  Cardiac RRR, Nl S1, S2  Vascular Vessel Right Left  Radial Palpable Palpable  Brachial Palpable Palpable  Ulnar Not palpable Not palpable    Musculo- skeletal  palpable left radial pulse; left  antecubitum incision well-healed; palpable thrill near anastomosis  Neurologic A&O; CN grossly intact     Non-invasive Vascular Imaging   left Arm Access Duplex  (12/13/18):   Fistula is greater than 0.6 cm in diameter throughout its length  Greater than 2 cm in depth throughout its length    Medical Decision Making   Caleb Hicks is a 78 y.o. male who presents for dialysis duplex status post first stage basilic vein transposition   Patent fistula with palpable thrill near the anastomosis  Based on fistula duplex, fistula is ready for second stage  Plan will be for 2nd stage basilic vein transposition by Dr. Donzetta Matters on 12/26/18  Risk, benefits, and alternatives to access surgery were discussed.    The patient is  aware the risks include but are not limited to: bleeding, infection, steal syndrome, nerve damage, thrombosis, failure to mature, and need for additional procedures.    The patient agrees to proceed with the procedure.    Dagoberto Ligas PA-C [facility] Office: [phone]  Clinic MD: Dr. Donzetta Matters

## 2018-12-23 ENCOUNTER — Other Ambulatory Visit (HOSPITAL_COMMUNITY)
Admission: RE | Admit: 2018-12-23 | Discharge: 2018-12-23 | Disposition: A | Discharge status: Home / Self Care | Payer: Medicare Other | Source: Ambulatory Visit | Attending: Vascular Surgery | Admitting: Vascular Surgery

## 2018-12-23 DIAGNOSIS — Z01812 Encounter for preprocedural laboratory examination: Secondary | ICD-10-CM | POA: Diagnosis not present

## 2018-12-23 DIAGNOSIS — Z1159 Encounter for screening for other viral diseases: Secondary | ICD-10-CM | POA: Insufficient documentation

## 2018-12-24 ENCOUNTER — Other Ambulatory Visit: Payer: Self-pay

## 2018-12-24 ENCOUNTER — Encounter (HOSPITAL_COMMUNITY): Payer: Self-pay | Admitting: *Deleted

## 2018-12-24 LAB — SARS CORONAVIRUS 2 (TAT 6-24 HRS): SARS Coronavirus 2: NEGATIVE

## 2018-12-24 NOTE — Progress Notes (Signed)
Spoke with pt's daughter, Alyse Low for pre-op call. DPR on file. She states pt does not have a cardiac history. He does have a Abdominal Aortic Aneurysm that is checked every 2 years. Pt is on Januvia and in May pt was adamant that he was not diabetic, just has high blood sugar. Alyse Low states no one has told her that he is diabetic. He does not check his blood sugar at home. Last A1C was 7.0 in November, 2019.   Pt had Covid 19 test done yesterday and it is negative.    Coronavirus Screening  Have you experienced the following symptoms:  Cough NO Fever (>100.42F)  NO Runny nose NO Sore throat NO Difficulty breathing/shortness of breath  NO Have you or a family member traveled in the last 14 days and where? NO   Alyse Low was reminded that hospital visitation restrictions are in effect and the importance of the restrictions.

## 2018-12-26 ENCOUNTER — Other Ambulatory Visit: Payer: Self-pay

## 2018-12-26 ENCOUNTER — Ambulatory Visit (HOSPITAL_COMMUNITY)
Admission: RE | Admit: 2018-12-26 | Discharge: 2018-12-26 | Disposition: A | Discharge status: Home / Self Care | Payer: Medicare Other | Source: Ambulatory Visit | Attending: Vascular Surgery | Admitting: Vascular Surgery

## 2018-12-26 ENCOUNTER — Ambulatory Visit (HOSPITAL_COMMUNITY): Payer: Medicare Other | Admitting: Certified Registered"

## 2018-12-26 ENCOUNTER — Encounter (HOSPITAL_COMMUNITY)
Admission: RE | Disposition: A | Discharge status: Home / Self Care | Payer: Self-pay | Source: Ambulatory Visit | Attending: Vascular Surgery

## 2018-12-26 ENCOUNTER — Inpatient Hospital Stay (HOSPITAL_COMMUNITY)
Admission: EM | Admit: 2018-12-26 | Discharge: 2018-12-30 | DRG: 629 | Disposition: A | Discharge status: Home Health | Payer: Medicare Other | Attending: Family Medicine | Admitting: Family Medicine

## 2018-12-26 ENCOUNTER — Encounter (HOSPITAL_COMMUNITY): Payer: Self-pay

## 2018-12-26 DIAGNOSIS — Z66 Do not resuscitate: Secondary | ICD-10-CM | POA: Diagnosis not present

## 2018-12-26 DIAGNOSIS — N179 Acute kidney failure, unspecified: Secondary | ICD-10-CM | POA: Diagnosis not present

## 2018-12-26 DIAGNOSIS — D62 Acute posthemorrhagic anemia: Secondary | ICD-10-CM | POA: Diagnosis not present

## 2018-12-26 DIAGNOSIS — Z1159 Encounter for screening for other viral diseases: Secondary | ICD-10-CM | POA: Diagnosis not present

## 2018-12-26 DIAGNOSIS — Z8249 Family history of ischemic heart disease and other diseases of the circulatory system: Secondary | ICD-10-CM

## 2018-12-26 DIAGNOSIS — I5032 Chronic diastolic (congestive) heart failure: Secondary | ICD-10-CM | POA: Diagnosis present

## 2018-12-26 DIAGNOSIS — Z7952 Long term (current) use of systemic steroids: Secondary | ICD-10-CM

## 2018-12-26 DIAGNOSIS — E1122 Type 2 diabetes mellitus with diabetic chronic kidney disease: Secondary | ICD-10-CM | POA: Diagnosis present

## 2018-12-26 DIAGNOSIS — M109 Gout, unspecified: Secondary | ICD-10-CM | POA: Diagnosis present

## 2018-12-26 DIAGNOSIS — Z7982 Long term (current) use of aspirin: Secondary | ICD-10-CM

## 2018-12-26 DIAGNOSIS — N184 Chronic kidney disease, stage 4 (severe): Secondary | ICD-10-CM

## 2018-12-26 DIAGNOSIS — I714 Abdominal aortic aneurysm, without rupture: Secondary | ICD-10-CM | POA: Diagnosis present

## 2018-12-26 DIAGNOSIS — Z85828 Personal history of other malignant neoplasm of skin: Secondary | ICD-10-CM | POA: Diagnosis not present

## 2018-12-26 DIAGNOSIS — E872 Acidosis: Secondary | ICD-10-CM | POA: Diagnosis not present

## 2018-12-26 DIAGNOSIS — M7989 Other specified soft tissue disorders: Secondary | ICD-10-CM | POA: Diagnosis present

## 2018-12-26 DIAGNOSIS — K5900 Constipation, unspecified: Secondary | ICD-10-CM | POA: Diagnosis present

## 2018-12-26 DIAGNOSIS — R55 Syncope and collapse: Secondary | ICD-10-CM | POA: Diagnosis not present

## 2018-12-26 DIAGNOSIS — I132 Hypertensive heart and chronic kidney disease with heart failure and with stage 5 chronic kidney disease, or end stage renal disease: Secondary | ICD-10-CM | POA: Diagnosis present

## 2018-12-26 DIAGNOSIS — Z87891 Personal history of nicotine dependence: Secondary | ICD-10-CM

## 2018-12-26 DIAGNOSIS — N185 Chronic kidney disease, stage 5: Secondary | ICD-10-CM | POA: Diagnosis present

## 2018-12-26 DIAGNOSIS — Z940 Kidney transplant status: Secondary | ICD-10-CM | POA: Insufficient documentation

## 2018-12-26 DIAGNOSIS — I10 Essential (primary) hypertension: Secondary | ICD-10-CM | POA: Diagnosis not present

## 2018-12-26 DIAGNOSIS — E1151 Type 2 diabetes mellitus with diabetic peripheral angiopathy without gangrene: Secondary | ICD-10-CM | POA: Diagnosis present

## 2018-12-26 DIAGNOSIS — E785 Hyperlipidemia, unspecified: Secondary | ICD-10-CM | POA: Diagnosis present

## 2018-12-26 DIAGNOSIS — Z7984 Long term (current) use of oral hypoglycemic drugs: Secondary | ICD-10-CM | POA: Insufficient documentation

## 2018-12-26 DIAGNOSIS — D509 Iron deficiency anemia, unspecified: Secondary | ICD-10-CM | POA: Diagnosis not present

## 2018-12-26 DIAGNOSIS — E875 Hyperkalemia: Secondary | ICD-10-CM | POA: Diagnosis not present

## 2018-12-26 DIAGNOSIS — Z91048 Other nonmedicinal substance allergy status: Secondary | ICD-10-CM

## 2018-12-26 DIAGNOSIS — E119 Type 2 diabetes mellitus without complications: Secondary | ICD-10-CM

## 2018-12-26 DIAGNOSIS — I120 Hypertensive chronic kidney disease with stage 5 chronic kidney disease or end stage renal disease: Secondary | ICD-10-CM | POA: Insufficient documentation

## 2018-12-26 DIAGNOSIS — Z79899 Other long term (current) drug therapy: Secondary | ICD-10-CM

## 2018-12-26 DIAGNOSIS — Z888 Allergy status to other drugs, medicaments and biological substances status: Secondary | ICD-10-CM

## 2018-12-26 HISTORY — PX: AV FISTULA PLACEMENT: SHX1204

## 2018-12-26 LAB — CBC WITH DIFFERENTIAL/PLATELET
Abs Immature Granulocytes: 0.1 10*3/uL — ABNORMAL HIGH (ref 0.00–0.07)
Basophils Absolute: 0 10*3/uL (ref 0.0–0.1)
Basophils Relative: 0 %
Eosinophils Absolute: 0 10*3/uL (ref 0.0–0.5)
Eosinophils Relative: 0 %
HCT: 26.8 % — ABNORMAL LOW (ref 39.0–52.0)
Hemoglobin: 8.1 g/dL — ABNORMAL LOW (ref 13.0–17.0)
Immature Granulocytes: 1 %
Lymphocytes Relative: 3 %
Lymphs Abs: 0.4 10*3/uL — ABNORMAL LOW (ref 0.7–4.0)
MCH: 30.5 pg (ref 26.0–34.0)
MCHC: 30.2 g/dL (ref 30.0–36.0)
MCV: 100.8 fL — ABNORMAL HIGH (ref 80.0–100.0)
Monocytes Absolute: 0.1 10*3/uL (ref 0.1–1.0)
Monocytes Relative: 1 %
Neutro Abs: 15.1 10*3/uL — ABNORMAL HIGH (ref 1.7–7.7)
Neutrophils Relative %: 95 %
Platelets: 334 10*3/uL (ref 150–400)
RBC: 2.66 MIL/uL — ABNORMAL LOW (ref 4.22–5.81)
RDW: 14.3 % (ref 11.5–15.5)
WBC: 15.8 10*3/uL — ABNORMAL HIGH (ref 4.0–10.5)
nRBC: 0 % (ref 0.0–0.2)

## 2018-12-26 LAB — GLUCOSE, CAPILLARY
Glucose-Capillary: 117 mg/dL — ABNORMAL HIGH (ref 70–99)
Glucose-Capillary: 124 mg/dL — ABNORMAL HIGH (ref 70–99)
Glucose-Capillary: 258 mg/dL — ABNORMAL HIGH (ref 70–99)

## 2018-12-26 LAB — BASIC METABOLIC PANEL
Anion gap: 9 (ref 5–15)
Anion gap: 11 (ref 5–15)
BUN: 60 mg/dL — ABNORMAL HIGH (ref 8–23)
BUN: 58 mg/dL — ABNORMAL HIGH (ref 8–23)
CO2: 21 mmol/L — ABNORMAL LOW (ref 22–32)
CO2: 21 mmol/L — ABNORMAL LOW (ref 22–32)
Calcium: 9.1 mg/dL (ref 8.9–10.3)
Calcium: 9.4 mg/dL (ref 8.9–10.3)
Chloride: 103 mmol/L (ref 98–111)
Chloride: 104 mmol/L (ref 98–111)
Creatinine, Ser: 4.31 mg/dL — ABNORMAL HIGH (ref 0.61–1.24)
Creatinine, Ser: 4.32 mg/dL — ABNORMAL HIGH (ref 0.61–1.24)
GFR calc Af Amer: 14 mL/min — ABNORMAL LOW (ref >60)
GFR calc Af Amer: 14 mL/min — ABNORMAL LOW (ref >60)
GFR calc non Af Amer: 12 mL/min — ABNORMAL LOW (ref >60)
GFR calc non Af Amer: 12 mL/min — ABNORMAL LOW (ref >60)
Glucose, Bld: 280 mg/dL — ABNORMAL HIGH (ref 70–99)
Glucose, Bld: 307 mg/dL — ABNORMAL HIGH (ref 70–99)
Potassium: 6.6 mmol/L (ref 3.5–5.1)
Potassium: 5.4 mmol/L — ABNORMAL HIGH (ref 3.5–5.1)
Sodium: 133 mmol/L — ABNORMAL LOW (ref 135–145)
Sodium: 136 mmol/L (ref 135–145)

## 2018-12-26 LAB — POCT I-STAT 4, (NA,K, GLUC, HGB,HCT)
Glucose, Bld: 112 mg/dL — ABNORMAL HIGH (ref 70–99)
HCT: 32 % — ABNORMAL LOW (ref 39.0–52.0)
Hemoglobin: 10.9 g/dL — ABNORMAL LOW (ref 13.0–17.0)
Potassium: 5.2 mmol/L — ABNORMAL HIGH (ref 3.5–5.1)
Sodium: 135 mmol/L (ref 135–145)

## 2018-12-26 LAB — SARS CORONAVIRUS 2 BY RT PCR (HOSPITAL ORDER, PERFORMED IN ~~LOC~~ HOSPITAL LAB): SARS Coronavirus 2: NEGATIVE

## 2018-12-26 SURGERY — INSERTION OF ARTERIOVENOUS (AV) GORE-TEX GRAFT ARM
Anesthesia: General | Site: Arm Upper | Laterality: Left

## 2018-12-26 MED ORDER — CALCIUM GLUCONATE-NACL 1-0.675 GM/50ML-% IV SOLN
1.0000 g | Freq: Once | INTRAVENOUS | Status: AC
  Administered 2018-12-26: 20:00:00 1000 mg via INTRAVENOUS
  Filled 2018-12-26: qty 50

## 2018-12-26 MED ORDER — CYCLOSPORINE MODIFIED (NEORAL) 25 MG PO CAPS: 50.0000 mg | ORAL_CAPSULE | Freq: Two times a day (BID) | ORAL | Status: DC

## 2018-12-26 MED ORDER — ONDANSETRON HCL 4 MG/2ML IJ SOLN: 4.0000 mg | Freq: Once | INTRAMUSCULAR | Status: DC | PRN

## 2018-12-26 MED ORDER — SODIUM BICARBONATE 8.4 % IV SOLN
50.0000 meq | Freq: Once | INTRAVENOUS | Status: AC
  Administered 2018-12-26: 50 meq via INTRAVENOUS
  Filled 2018-12-26: qty 50

## 2018-12-26 MED ORDER — ONDANSETRON HCL 4 MG PO TABS: 4.0000 mg | ORAL_TABLET | Freq: Four times a day (QID) | ORAL | Status: DC | PRN

## 2018-12-26 MED ORDER — PROPOFOL 10 MG/ML IV BOLUS
INTRAVENOUS | Status: DC | PRN
  Administered 2018-12-26: 100 mg via INTRAVENOUS
  Administered 2018-12-26: 30 mg via INTRAVENOUS

## 2018-12-26 MED ORDER — DEXAMETHASONE SODIUM PHOSPHATE 10 MG/ML IJ SOLN
INTRAMUSCULAR | Status: DC | PRN
  Administered 2018-12-26: 10 mg via INTRAVENOUS

## 2018-12-26 MED ORDER — ALBUMIN HUMAN 5 % IV SOLN
INTRAVENOUS | Status: DC | PRN
  Administered 2018-12-26 (×2): via INTRAVENOUS

## 2018-12-26 MED ORDER — PHENYLEPHRINE 40 MCG/ML (10ML) SYRINGE FOR IV PUSH (FOR BLOOD PRESSURE SUPPORT)
PREFILLED_SYRINGE | INTRAVENOUS | Status: DC | PRN
  Administered 2018-12-26: 120 ug via INTRAVENOUS

## 2018-12-26 MED ORDER — HEMOSTATIC AGENTS (NO CHARGE) OPTIME
TOPICAL | Status: DC | PRN
  Administered 2018-12-26: 1 via TOPICAL

## 2018-12-26 MED ORDER — PROPOFOL 10 MG/ML IV BOLUS
INTRAVENOUS | Status: AC
  Filled 2018-12-26: qty 20

## 2018-12-26 MED ORDER — CYCLOSPORINE MODIFIED (NEORAL) 25 MG PO CAPS
50.0000 mg | ORAL_CAPSULE | Freq: Two times a day (BID) | ORAL | Status: DC
  Administered 2018-12-26 – 2018-12-30 (×8): 50 mg via ORAL
  Filled 2018-12-26 (×9): qty 2

## 2018-12-26 MED ORDER — LIDOCAINE 2% (20 MG/ML) 5 ML SYRINGE
INTRAMUSCULAR | Status: DC | PRN
  Administered 2018-12-26: 80 mg via INTRAVENOUS

## 2018-12-26 MED ORDER — DEXTROSE 50 % IV SOLN
1.0000 | Freq: Once | INTRAVENOUS | Status: AC
  Administered 2018-12-26: 50 mL via INTRAVENOUS
  Filled 2018-12-26: qty 50

## 2018-12-26 MED ORDER — SODIUM CHLORIDE 0.9 % IV SOLN
INTRAVENOUS | Status: DC
  Administered 2018-12-26 (×2): via INTRAVENOUS

## 2018-12-26 MED ORDER — ACETAMINOPHEN 500 MG PO TABS
1000.0000 mg | ORAL_TABLET | Freq: Once | ORAL | Status: AC
  Administered 2018-12-26: 1000 mg via ORAL
  Filled 2018-12-26: qty 2

## 2018-12-26 MED ORDER — ONDANSETRON HCL 4 MG/2ML IJ SOLN: 4.0000 mg | Freq: Four times a day (QID) | INTRAMUSCULAR | Status: DC | PRN

## 2018-12-26 MED ORDER — LIDOCAINE HCL (PF) 1 % IJ SOLN
INTRAMUSCULAR | Status: AC
  Filled 2018-12-26: qty 30

## 2018-12-26 MED ORDER — SODIUM CHLORIDE 0.9 % IV SOLN
INTRAVENOUS | Status: AC
  Filled 2018-12-26: qty 1.2

## 2018-12-26 MED ORDER — INSULIN ASPART 100 UNIT/ML ~~LOC~~ SOLN
0.0000 [IU] | Freq: Every day | SUBCUTANEOUS | Status: DC
  Administered 2018-12-26: 3 [IU] via SUBCUTANEOUS

## 2018-12-26 MED ORDER — ACETAMINOPHEN 650 MG RE SUPP: 650.0000 mg | Freq: Four times a day (QID) | RECTAL | Status: DC | PRN

## 2018-12-26 MED ORDER — OXYCODONE-ACETAMINOPHEN 5-325 MG PO TABS: 1.0000 | ORAL_TABLET | Freq: Four times a day (QID) | ORAL | Status: DC | PRN

## 2018-12-26 MED ORDER — INSULIN ASPART 100 UNIT/ML IV SOLN
5.0000 [IU] | Freq: Once | INTRAVENOUS | Status: AC
  Administered 2018-12-26: 20:00:00 5 [IU] via INTRAVENOUS

## 2018-12-26 MED ORDER — DARBEPOETIN ALFA 40 MCG/0.4ML IJ SOSY
40.0000 ug | PREFILLED_SYRINGE | Freq: Once | INTRAMUSCULAR | Status: AC
  Administered 2018-12-26: 40 ug via SUBCUTANEOUS
  Filled 2018-12-26: qty 0.4

## 2018-12-26 MED ORDER — OXYCODONE-ACETAMINOPHEN 5-325 MG PO TABS: 1.0000 | ORAL_TABLET | Freq: Four times a day (QID) | ORAL | 0 refills | Status: DC | PRN

## 2018-12-26 MED ORDER — CEFAZOLIN SODIUM-DEXTROSE 2-4 GM/100ML-% IV SOLN
2.0000 g | INTRAVENOUS | Status: AC
  Administered 2018-12-26: 2 g via INTRAVENOUS
  Filled 2018-12-26: qty 100

## 2018-12-26 MED ORDER — INSULIN ASPART 100 UNIT/ML ~~LOC~~ SOLN
0.0000 [IU] | Freq: Three times a day (TID) | SUBCUTANEOUS | Status: DC
  Administered 2018-12-27 (×3): 2 [IU] via SUBCUTANEOUS
  Administered 2018-12-28 – 2018-12-29 (×4): 1 [IU] via SUBCUTANEOUS
  Administered 2018-12-29: 2 [IU] via SUBCUTANEOUS
  Administered 2018-12-29: 1 [IU] via SUBCUTANEOUS
  Administered 2018-12-30: 12:00:00 2 [IU] via SUBCUTANEOUS

## 2018-12-26 MED ORDER — ONDANSETRON HCL 4 MG/2ML IJ SOLN
INTRAMUSCULAR | Status: DC | PRN
  Administered 2018-12-26: 4 mg via INTRAVENOUS

## 2018-12-26 MED ORDER — INSULIN ASPART 100 UNIT/ML ~~LOC~~ SOLN: 0.0000 [IU] | SUBCUTANEOUS | Status: DC

## 2018-12-26 MED ORDER — SODIUM CHLORIDE 0.9 % IV SOLN
INTRAVENOUS | Status: DC | PRN
  Administered 2018-12-26: 500 mL

## 2018-12-26 MED ORDER — SODIUM CHLORIDE 0.9 % IV SOLN
INTRAVENOUS | Status: AC
  Administered 2018-12-26: 23:00:00 via INTRAVENOUS

## 2018-12-26 MED ORDER — PREDNISONE 5 MG PO TABS
5.0000 mg | ORAL_TABLET | Freq: Every day | ORAL | Status: DC
  Administered 2018-12-27 – 2018-12-30 (×4): 5 mg via ORAL
  Filled 2018-12-26 (×4): qty 1

## 2018-12-26 MED ORDER — LIDOCAINE 2% (20 MG/ML) 5 ML SYRINGE
INTRAMUSCULAR | Status: AC
  Filled 2018-12-26: qty 5

## 2018-12-26 MED ORDER — SODIUM ZIRCONIUM CYCLOSILICATE 10 G PO PACK
10.0000 g | PACK | Freq: Once | ORAL | Status: AC
  Administered 2018-12-26: 10 g via ORAL
  Filled 2018-12-26: qty 1

## 2018-12-26 MED ORDER — EPHEDRINE SULFATE-NACL 50-0.9 MG/10ML-% IV SOSY
PREFILLED_SYRINGE | INTRAVENOUS | Status: DC | PRN
  Administered 2018-12-26: 10 mg via INTRAVENOUS
  Administered 2018-12-26: 15 mg via INTRAVENOUS
  Administered 2018-12-26: 10 mg via INTRAVENOUS
  Administered 2018-12-26 (×2): 15 mg via INTRAVENOUS
  Administered 2018-12-26: 10 mg via INTRAVENOUS

## 2018-12-26 MED ORDER — SODIUM CHLORIDE 0.9 % IV SOLN
INTRAVENOUS | Status: DC | PRN
  Administered 2018-12-26: 20 ug/min via INTRAVENOUS

## 2018-12-26 MED ORDER — ACETAMINOPHEN 325 MG PO TABS: 650.0000 mg | ORAL_TABLET | Freq: Four times a day (QID) | ORAL | Status: DC | PRN

## 2018-12-26 MED ORDER — PAPAVERINE HCL 30 MG/ML IJ SOLN
INTRAMUSCULAR | Status: AC
  Filled 2018-12-26: qty 2

## 2018-12-26 MED ORDER — FENTANYL CITRATE (PF) 100 MCG/2ML IJ SOLN: 25.0000 ug | INTRAMUSCULAR | Status: DC | PRN

## 2018-12-26 MED ORDER — FENTANYL CITRATE (PF) 250 MCG/5ML IJ SOLN
INTRAMUSCULAR | Status: AC
  Filled 2018-12-26: qty 5

## 2018-12-26 MED ORDER — 0.9 % SODIUM CHLORIDE (POUR BTL) OPTIME
TOPICAL | Status: DC | PRN
  Administered 2018-12-26: 1000 mL

## 2018-12-26 MED ORDER — FENTANYL CITRATE (PF) 100 MCG/2ML IJ SOLN
INTRAMUSCULAR | Status: DC | PRN
  Administered 2018-12-26 (×4): 25 ug via INTRAVENOUS
  Administered 2018-12-26: 50 ug via INTRAVENOUS

## 2018-12-26 SURGICAL SUPPLY — 39 items
ARMBAND PINK RESTRICT EXTREMIT (MISCELLANEOUS) ×4 IMPLANT
BANDAGE ELASTIC 4 VELCRO ST LF (GAUZE/BANDAGES/DRESSINGS) ×2 IMPLANT
CANISTER SUCT 3000ML PPV (MISCELLANEOUS) ×4 IMPLANT
CLIP VESOCCLUDE MED 24/CT (CLIP) IMPLANT
CLIP VESOCCLUDE MED 6/CT (CLIP) IMPLANT
CLIP VESOCCLUDE SM WIDE 24/CT (CLIP) ×2 IMPLANT
CLIP VESOCCLUDE SM WIDE 6/CT (CLIP) ×2 IMPLANT
COVER PROBE W GEL 5X96 (DRAPES) ×4 IMPLANT
COVER WAND RF STERILE (DRAPES) ×4 IMPLANT
DERMABOND ADVANCED (GAUZE/BANDAGES/DRESSINGS) ×2
DERMABOND ADVANCED .7 DNX12 (GAUZE/BANDAGES/DRESSINGS) ×2 IMPLANT
ELECT REM PT RETURN 9FT ADLT (ELECTROSURGICAL) ×4
ELECTRODE REM PT RTRN 9FT ADLT (ELECTROSURGICAL) ×2 IMPLANT
GLOVE BIO SURGEON STRL SZ 6.5 (GLOVE) ×1 IMPLANT
GLOVE BIO SURGEON STRL SZ7.5 (GLOVE) ×4 IMPLANT
GLOVE BIO SURGEONS STRL SZ 6.5 (GLOVE) ×1
GLOVE BIOGEL PI IND STRL 6.5 (GLOVE) IMPLANT
GLOVE BIOGEL PI INDICATOR 6.5 (GLOVE) ×4
GOWN EXTRA PROTECTION XXL 0583 (GOWNS) ×2 IMPLANT
GOWN STRL REUS W/ TWL LRG LVL3 (GOWN DISPOSABLE) ×4 IMPLANT
GOWN STRL REUS W/ TWL XL LVL3 (GOWN DISPOSABLE) ×2 IMPLANT
GOWN STRL REUS W/TWL LRG LVL3 (GOWN DISPOSABLE) ×4
GOWN STRL REUS W/TWL XL LVL3 (GOWN DISPOSABLE) ×2
GRAFT GORETEX STRT 4-7X45 (Vascular Products) ×2 IMPLANT
KIT BASIN OR (CUSTOM PROCEDURE TRAY) ×4 IMPLANT
KIT TURNOVER KIT B (KITS) ×4 IMPLANT
NS IRRIG 1000ML POUR BTL (IV SOLUTION) ×4 IMPLANT
PACK CV ACCESS (CUSTOM PROCEDURE TRAY) ×4 IMPLANT
PAD ARMBOARD 7.5X6 YLW CONV (MISCELLANEOUS) ×8 IMPLANT
SURGICEL SNOW 2X4 (HEMOSTASIS) ×2 IMPLANT
SUT MNCRL AB 4-0 PS2 18 (SUTURE) ×6 IMPLANT
SUT PROLENE 5 0 C 1 24 (SUTURE) ×4 IMPLANT
SUT PROLENE 6 0 BV (SUTURE) ×10 IMPLANT
SUT SILK 2 0 SH (SUTURE) IMPLANT
SUT VIC AB 3-0 SH 27 (SUTURE) ×4
SUT VIC AB 3-0 SH 27X BRD (SUTURE) ×2 IMPLANT
TOWEL GREEN STERILE (TOWEL DISPOSABLE) ×4 IMPLANT
UNDERPAD 30X30 (UNDERPADS AND DIAPERS) ×4 IMPLANT
WATER STERILE IRR 1000ML POUR (IV SOLUTION) ×4 IMPLANT

## 2018-12-26 NOTE — Anesthesia Procedure Notes (Signed)
Procedure Name: LMA Insertion Date/Time: 12/26/2018 1:04 PM Performed by: Lance Coon, CRNA Pre-anesthesia Checklist: Patient identified, Emergency Drugs available, Suction available, Patient being monitored and Timeout performed Patient Re-evaluated:Patient Re-evaluated prior to induction Oxygen Delivery Method: Circle system utilized Preoxygenation: Pre-oxygenation with 100% oxygen Induction Type: IV induction LMA: LMA inserted LMA Size: 4.0 Number of attempts: 1 Placement Confirmation: breath sounds checked- equal and bilateral and positive ETCO2 Tube secured with: Tape Dental Injury: Teeth and Oropharynx as per pre-operative assessment

## 2018-12-26 NOTE — H&P (Signed)
   History and Physical Update  The patient was interviewed and re-examined.  The patient's previous History and Physical has been reviewed and is unchanged from recent office visit. Plan for left arm second stage bvt.  Aaliyah Cancro C. Donzetta Matters, MD [facility] Office: [phone] Pager: [phone]   12/26/2018, 11:50 AM

## 2018-12-26 NOTE — ED Notes (Signed)
ED TO INPATIENT HANDOFF REPORT  ED Nurse Name and Phone #: [phone]  S Name/Age/Gender Caleb Hicks 78 y.o. male Room/Bed: 025C/025C  Code Status   Code Status: DNR  Home/SNF/Other Home Patient oriented to: self, place, time and situation Is this baseline? Yes   Triage Complete: Triage complete  Chief Complaint Syncope  Triage Note Pt had a syncable episode where he did fall & did remember hitting his head, rates his pain 5/10 in the Lt side of his body (where he landed), EMS stated he was slightly hypotensive upon their arrival at 112/64, afebrile. Pt believes this could be related to the surgery he had today for his new dialysis fistula in the Lt-arm.     Allergies Allergies  Allergen Reactions  . Norvasc [Amlodipine Besylate] Other (See Comments)    Weight gain  . Tape Other (See Comments)    Tears skin    Level of Care/Admitting Diagnosis ED Disposition    ED Disposition Condition [facility] Area: [place] [[id number]]  Level of Care: Progressive [102]  Covid Evaluation: Asymptomatic Screening Protocol (No Symptoms)  Diagnosis: Hyperkalemia [361443]  Admitting Physician: Doreatha Massed  Attending Physician: Etta Quill [phone]  Estimated length of stay: past midnight tomorrow  Certification:: I certify this patient will need inpatient services for at least 2 midnights  PT Class (Do Not Modify): Inpatient [101]  PT Acc Code (Do Not Modify): Private [1]       B Medical/Surgery History Past Medical History:  Diagnosis Date  . AAA (abdominal aortic aneurysm) ([place]): 4 cm 03/24/2017   3.5X 3. 5 CM PER 10-6 ABDOMINAL CT FOLOOWED EVRY 2 YEARS   . Abdominal aneurysm ([place])   . Acute kidney injury superimposed on CKD [facility]): Stage 4 03/24/2017  . Anemia   . Arthritis   . Cancer ([place])    skin cancer - basal cell  . Diverticulitis 03/2017   1 UNIT BLOOD GIVEN  . Gout   . Hepatic cyst: Solitary 03/24/2017  .  Hepatitis    Hepatitis A  . Hydrocele sac   . Hyperlipidemia 03/24/2017  . Hypertension   . Hyperuricemia 03/24/2017  . 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 Pre-diabetes    Pt states he has "high blood sugar" but is not diabetic or pre-diabetic. He states he hasn't had an A1C done. He does take Januvia daily.  . 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 AV FISTULA PLACEMENT Left 11/12/2018   Procedure: BRACHIOBASILIC ARTERIOVENOUS (AV) FISTULA CREATION LEFT ARM;  Surgeon: Waynetta Sandy, MD;  Location: [facility];  Service: Vascular;  Laterality: Left;  . COLONOSCOPY    . CYST REMOVED FROM SPINE  YRS AGO  . HERNIA REPAIR     SEVERAL  . HYDROCELE EXCISION Bilateral 06/04/2017   Procedure: BILATERAL HYDROCELECTOMY ADULT;  Surgeon: Lucas Mallow, MD;  [facility];  Service: Urology;  Laterality: Bilateral;  . KIDNEY TRANSPLANT    . PILIODINAL CYST  40-50 YRS AGO     A IV Location/Drains/Wounds Patient Lines/Drains/Airways Status   Active Line/Drains/Airways    Name:   Placement date:   Placement time:   Site:   Days:   Peripheral IV 11/12/17 Right;Posterior Forearm   11/12/17    1749    Forearm   409   Peripheral IV 12/26/18 Right Antecubital   12/26/18  1814    Antecubital   less than 1   Fistula / Graft Left Upper arm Arteriovenous fistula   11/12/18    1513    Upper arm   44   Fistula / Graft Left Upper arm Arteriovenous vein graft   12/26/18    1419    Upper arm   less than 1   Incision (Closed) 11/12/18 Arm Left   11/12/18    1507     44   Incision (Closed) 12/26/18 Arm Left   12/26/18    1448     less than 1          Intake/Output Last 24 hours No intake or output data in the 24 hours ending 12/26/18 2125  Labs/Imaging Results for orders placed or performed during the hospital encounter of 12/26/18 (from the past 48 hour(s))  Basic metabolic panel     Status:  Abnormal   Collection Time: 12/26/18  7:30 PM  Result Value Ref Range   Sodium 133 (L) 135 - 145 mmol/L   Potassium 6.6 (HH) 3.5 - 5.1 mmol/L    Comment: NO VISIBLE HEMOLYSIS CRITICAL RESULT CALLED TO, READ BACK BY AND VERIFIED WITH: BEN BAILEE,RN 1943 12/26/2018 WBOND    Chloride 103 98 - 111 mmol/L   CO2 21 (L) 22 - 32 mmol/L   Glucose, Bld 280 (H) 70 - 99 mg/dL   BUN 60 (H) 8 - 23 mg/dL   Creatinine, Ser 4.31 (H) 0.61 - 1.24 mg/dL   Calcium 9.1 8.9 - 10.3 mg/dL   GFR calc non Af Amer 12 (L) >60 mL/min   GFR calc Af Amer 14 (L) >60 mL/min   Anion gap 9 5 - 15    Comment: Performed at [facility], [address] [place], [place] [address]  CBC with Differential     Status: Abnormal   Collection Time: 12/26/18  7:30 PM  Result Value Ref Range   WBC 15.8 (H) 4.0 - 10.5 K/uL   RBC 2.66 (L) 4.22 - 5.81 MIL/uL   Hemoglobin 8.1 (L) 13.0 - 17.0 g/dL    Comment: REPEATED TO VERIFY   HCT 26.8 (L) 39.0 - 52.0 %   MCV 100.8 (H) 80.0 - 100.0 fL   MCH 30.5 26.0 - 34.0 pg   MCHC 30.2 30.0 - 36.0 g/dL   RDW 14.3 11.5 - 15.5 %   Platelets 334 150 - 400 K/uL   nRBC 0.0 0.0 - 0.2 %   Neutrophils Relative % 95 %   Neutro Abs 15.1 (H) 1.7 - 7.7 K/uL   Lymphocytes Relative 3 %   Lymphs Abs 0.4 (L) 0.7 - 4.0 K/uL   Monocytes Relative 1 %   Monocytes Absolute 0.1 0.1 - 1.0 K/uL   Eosinophils Relative 0 %   Eosinophils Absolute 0.0 0.0 - 0.5 K/uL   Basophils Relative 0 %   Basophils Absolute 0.0 0.0 - 0.1 K/uL   Immature Granulocytes 1 %   Abs Immature Granulocytes 0.10 (H) 0.00 - 0.07 K/uL    Comment: Performed at [facility] [address], [place],  [address]   No results found.  Pending Labs Unresulted Labs (From admission, onward)    Start     Ordered   12/27/18 0900  Potassium  Once-Timed,   STAT     12/26/18 2108   12/27/18 0500  CBC  Tomorrow morning,   R     12/26/18 2108   12/27/18 0500  Basic metabolic panel  Tomorrow morning,   R     12/26/18 2108    12/27/18 0100  Potassium  Once-Timed,   STAT     12/26/18 2108   12/26/18 7591  Basic metabolic panel  Once,   STAT     12/26/18 2020   12/26/18 2112  Hemoglobin A1c  Once,   STAT    Comments: To assess prior glycemic control    12/26/18 2112   12/26/18 2102  Glucose, random  (Moderate hyperkalemia:  Potassium  6.5 to 7 mEq/L without ECG changes)  Once,   STAT    Comments: 1 hour after insulin administered.    12/26/18 1946   12/26/18 2005  SARS Coronavirus 2 (CEPHEID - Performed in [facility] hospital lab), Hosp Order  (Asymptomatic Patients Labs)  Once,   STAT    Question:  Rule Out  Answer:  Yes   12/26/18 2004          Vitals/Pain Today's Vitals   12/26/18 1945 12/26/18 2000 12/26/18 2015 12/26/18 2100  BP: 132/69 (!) 124/49 (!) 140/51 (!) 146/54  Pulse: (!) 56 (!) 57 (!) 57 72  Resp: (!) 24 19 15  (!) 23  Temp:      TempSrc:      SpO2: 96% 97% 98% 98%  Weight:      Height:      PainSc:        Isolation Precautions No active isolations  Medications Medications  acetaminophen (TYLENOL) tablet 650 mg (has no administration in time range)    Or  acetaminophen (TYLENOL) suppository 650 mg (has no administration in time range)  ondansetron (ZOFRAN) tablet 4 mg (has no administration in time range)    Or  ondansetron (ZOFRAN) injection 4 mg (has no administration in time range)  insulin aspart (novoLOG) injection 0-9 Units (has no administration in time range)  insulin aspart (novoLOG) injection 0-5 Units (has no administration in time range)  cycloSPORINE modified (NEORAL) capsule 50 mg (has no administration in time range)  oxyCODONE-acetaminophen (PERCOCET/ROXICET) 5-325 MG per tablet 1 tablet (has no administration in time range)  predniSONE (DELTASONE) tablet 5 mg (has no administration in time range)  insulin aspart (novoLOG) injection 5 Units (5 Units Intravenous Given 12/26/18 2015)    And  dextrose 50 % solution 50 mL (50 mLs Intravenous Given 12/26/18 2015)   sodium zirconium cyclosilicate (LOKELMA) packet 10 g (10 g Oral Given 12/26/18 2029)  sodium bicarbonate injection 50 mEq (50 mEq Intravenous Given 12/26/18 2015)  calcium gluconate 1 g/ 50 mL sodium chloride IVPB (0 g Intravenous Stopped 12/26/18 2112)    Mobility non-ambulatory Low fall risk   Focused Assessments       R Recommendations: See Admitting Provider Note  Report given to:   Additional Notes: Left Arm Restriction, New Fistula

## 2018-12-26 NOTE — Plan of Care (Signed)
  Problem: Education: Goal: Knowledge of General Education information will improve Description Including pain rating scale, medication(s)/side effects and non-pharmacologic comfort measures Outcome: Progressing   

## 2018-12-26 NOTE — Anesthesia Postprocedure Evaluation (Signed)
Anesthesia Post Note  Patient: Caleb Hicks  Procedure(s) Performed: Insertion Of Arteriovenous (Av) Gore-Tex Graft Left Arm (Left Arm Upper)     Patient location during evaluation: PACU Anesthesia Type: General Level of consciousness: awake and alert Pain management: pain level controlled Vital Signs Assessment: post-procedure vital signs reviewed and stable Respiratory status: spontaneous breathing, nonlabored ventilation, respiratory function stable and patient connected to nasal cannula oxygen Cardiovascular status: blood pressure returned to baseline and stable Postop Assessment: no apparent nausea or vomiting Anesthetic complications: no    Last Vitals:  Vitals:   12/26/18 1522 12/26/18 1532  BP:  (!) 147/65  Pulse: 63 66  Resp: 18 18  Temp: (!) 36.4 C (!) 36.4 C  SpO2: 98% 99%    Last Pain:  Vitals:   12/26/18 1532  TempSrc:   PainSc: 0-No pain                 Catalina Gravel

## 2018-12-26 NOTE — Transfer of Care (Signed)
Immediate Anesthesia Transfer of Care Note  Patient: Caleb Hicks  Procedure(s) Performed: Insertion Of Arteriovenous (Av) Gore-Tex Graft Left Arm (Left Arm Upper)  Patient Location: PACU  Anesthesia Type:General  Level of Consciousness: awake and patient cooperative  Airway & Oxygen Therapy: Patient Spontanous Breathing  Post-op Assessment: Report given to RN and Post -op Vital signs reviewed and stable  Post vital signs: Reviewed and stable  Last Vitals:  Vitals Value Taken Time  BP 143/55 12/26/18 1500  Temp    Pulse 81 12/26/18 1502  Resp 22 12/26/18 1502  SpO2 95 % 12/26/18 1502  Vitals shown include unvalidated device data.  Last Pain:  Vitals:   12/26/18 0957  TempSrc:   PainSc: 0-No pain         Complications: No apparent anesthesia complications

## 2018-12-26 NOTE — ED Triage Notes (Signed)
Pt had a syncable episode where he did fall & did remember hitting his head, rates his pain 5/10 in the Lt side of his body (where he landed), EMS stated he was slightly hypotensive upon their arrival at 112/64, afebrile. Pt believes this could be related to the surgery he had today for his new dialysis fistula in the Lt-arm.

## 2018-12-26 NOTE — Anesthesia Preprocedure Evaluation (Addendum)
Anesthesia Evaluation  Patient identified by MRN, date of birth, ID band Patient awake    Reviewed: Allergy & Precautions, NPO status , Patient's Chart, lab work & pertinent test results, reviewed documented beta blocker date and time   Airway Mallampati: III  TM Distance: >3 FB Neck ROM: Full    Dental  (+) Teeth Intact, Dental Advisory Given   Pulmonary neg pulmonary ROS, former smoker,    Pulmonary exam normal breath sounds clear to auscultation       Cardiovascular hypertension, Pt. on home beta blockers and Pt. on medications + Peripheral Vascular Disease (AAA)  Normal cardiovascular exam Rhythm:Regular Rate:Normal     Neuro/Psych negative neurological ROS     GI/Hepatic negative GI ROS, (+) Hepatitis -, A  Endo/Other  diabetes, Type 2, Oral Hypoglycemic Agents  Renal/GU Renal disease (chronic kidney disease Stage 5)S/p renal transplant 1997     Musculoskeletal negative musculoskeletal ROS (+)   Abdominal   Peds  Hematology negative hematology ROS (+)   Anesthesia Other Findings Day of surgery medications reviewed with the patient.  Reproductive/Obstetrics                            Anesthesia Physical Anesthesia Plan  ASA: III  Anesthesia Plan: General   Post-op Pain Management:    Induction: Intravenous  PONV Risk Score and Plan: 2 and Ondansetron and Dexamethasone  Airway Management Planned: LMA  Additional Equipment:   Intra-op Plan:   Post-operative Plan: Extubation in OR  Informed Consent: I have reviewed the patients History and Physical, chart, labs and discussed the procedure including the risks, benefits and alternatives for the proposed anesthesia with the patient or authorized representative who has indicated his/her understanding and acceptance.     Dental advisory given  Plan Discussed with: CRNA  Anesthesia Plan Comments:         Anesthesia  Quick Evaluation

## 2018-12-26 NOTE — ED Provider Notes (Signed)
[facility] Provider Note   CSN: [id number] Arrival date & time: 12/26/18  1723     History   Chief Complaint Chief Complaint  Patient presents with  . Loss of Consciousness    HPI Caleb Hicks is a 78 y.o. male.  He was just having a outpatient surgery today for anticipation of needing dialysis and had a vein harvest and fistula creation in his left arm.  He was discharged home and he said on exiting the car to walk into the house he felt lightheaded and then fell down to the ground.  He says he quickly regained consciousness and now does not have any complaints.     The history is provided by the patient.  Loss of Consciousness Episode history:  Single Most recent episode:  Today Progression:  Resolved Chronicity:  New Context: standing up   Witnessed: yes   Relieved by:  Lying down Worsened by:  Nothing Ineffective treatments:  None tried Associated symptoms: recent surgery   Associated symptoms: no chest pain, no fever, no nausea, no seizures, no shortness of breath and no vomiting     Past Medical History:  Diagnosis Date  . AAA (abdominal aortic aneurysm) ([place]): 4 cm 03/24/2017   3.5X 3. 5 CM PER 10-6 ABDOMINAL CT FOLOOWED EVRY 2 YEARS   . Abdominal aneurysm ([place])   . Acute kidney injury superimposed on CKD [facility]): Stage 4 03/24/2017  . Anemia   . Arthritis   . Cancer ([place])    skin cancer - basal cell  . Diverticulitis 03/2017   1 UNIT BLOOD GIVEN  . Gout   . Hepatic cyst: Solitary 03/24/2017  . Hepatitis    Hepatitis A  . Hydrocele sac   . Hyperlipidemia 03/24/2017  . Hypertension   . Hyperuricemia 03/24/2017  . 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 Pre-diabetes    Pt states he has "high blood sugar" but is not diabetic or pre-diabetic. He states he hasn't had an A1C done. He does take Januvia daily.  . 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 CKD (chronic kidney disease) stage 4, GFR 15-29 ml/min (HCC) 04/24/2018  . Diverticulosis with recurrent diverticulitis 04/24/2018  . Iron deficiency anemia 03/25/2017  . Low vitamin B12 level 03/25/2017  . HTN (hypertension) 03/24/2017  . AAA [facility]): 4 cm - repeat 10/2019 03/24/2017  . IgA nephropathy 03/24/2017  . Hyperlipidemia 03/24/2017  . 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 Hyperuricemia 03/24/2017  . Hepatic cyst: Solitary 03/24/2017  . Renal cyst 03/24/2017  . 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 AV FISTULA PLACEMENT Left 11/12/2018   Procedure: BRACHIOBASILIC ARTERIOVENOUS (AV) FISTULA CREATION LEFT ARM;  Surgeon: Waynetta Sandy, MD;  Location: [facility];  Service: Vascular;  Laterality: Left;  . COLONOSCOPY    . CYST REMOVED FROM SPINE  YRS AGO  . HERNIA REPAIR     SEVERAL  . HYDROCELE EXCISION Bilateral 06/04/2017   Procedure: BILATERAL HYDROCELECTOMY ADULT;  Surgeon: Lucas Mallow, MD;  Location: [facility];  Service: Urology;  Laterality: Bilateral;  . KIDNEY TRANSPLANT    . PILIODINAL CYST  40-50 YRS AGO        Home Medications    Prior to Admission medications   Medication Sig Start Date End Date Taking?  Authorizing Provider  allopurinol (ZYLOPRIM) 100 MG tablet Take 200 mg by mouth daily.  03/13/17   [provider]  aspirin 325 MG EC tablet Take 325 mg by mouth daily.    [provider]  atorvastatin (LIPITOR) 40 MG tablet Take 1 tablet (40 mg total) by mouth daily. Patient not taking: Reported on 12/13/2018 05/14/18   [name], MD  calcitRIOL (ROCALTROL) 0.25 MCG capsule Take 1 capsule (0.25 mcg total) by mouth daily. Patient taking differently: Take 0.25 mcg by mouth every other day.  11/24/17   Charlynne Cousins, MD  calcium carbonate (OS-CAL - DOSED IN MG OF ELEMENTAL CALCIUM) 1250 (500 Ca) MG tablet Take 1-2  tablets by mouth See admin instructions. 2 tablets in the morning, and 1 tablet in the evening    [provider]  cycloSPORINE modified (NEORAL) 25 MG capsule Take 3 capsules (75 mg total) by mouth daily. Patient taking differently: Take 50 mg by mouth 2 (two) times daily.  11/24/17   Charlynne Cousins, MD  furosemide (LASIX) 40 MG tablet Take 1 tablet (40 mg total) by mouth daily. 11/27/17   Charlynne Cousins, MD  metoprolol tartrate (LOPRESSOR) 50 MG tablet Take 50 mg by mouth 2 (two) times daily. 12/25/16   [provider]  oxyCODONE-acetaminophen (PERCOCET/ROXICET) 5-325 MG tablet Take 1 tablet by mouth every 6 (six) hours as needed. 12/26/18   Dagoberto Ligas, PA-C  predniSONE (DELTASONE) 5 MG tablet Take 5 mg by mouth daily. 03/16/17   [provider]  senna-docusate (SENOKOT-S) 8.6-50 MG tablet Take 1 tablet by mouth at bedtime. Patient taking differently: Take 1 tablet by mouth at bedtime as needed for moderate constipation.  03/28/17   Eugenie Filler, MD  sitaGLIPtin (JANUVIA) 25 MG tablet Take 1 tablet (25 mg total) by mouth daily. 05/14/18   [name], MD  sodium bicarbonate 650 MG tablet Take 1,300 mg by mouth 2 (two) times daily.    [provider]  telmisartan (MICARDIS) 80 MG tablet Take 1 tablet (80 mg total) by mouth daily. 12/04/17   Charlynne Cousins, MD    Family History Family History  Problem Relation Age of Onset  . Heart attack Father   . AAA (abdominal aortic aneurysm) Brother   . Cerebral aneurysm Brother     Social History Social History   Tobacco Use  . Smoking status: Former Smoker    Packs/day: 2.00    Years: 20.00    Pack years: 40.00    Types: Cigarettes  . Smokeless tobacco: Never Used  . Tobacco comment: 1984  Substance Use Topics  . Alcohol use: No    Frequency: Never  . Drug use: No     Allergies   Norvasc [amlodipine besylate] and Tape   Review of Systems Review of Systems   Constitutional: Negative for fever.  HENT: Negative for sore throat.   Eyes: Negative for visual disturbance.  Respiratory: Negative for shortness of breath.   Cardiovascular: Positive for syncope. Negative for chest pain.  Gastrointestinal: Negative for abdominal pain, nausea and vomiting.  Genitourinary: Negative for dysuria.  Musculoskeletal: Negative for neck pain.  Skin: Negative for rash.  Neurological: Negative for seizures.     Physical Exam Updated Vital Signs BP (!) 117/50   Pulse (!) 57   Temp 97.6 F (36.4 C) (Oral)   Resp 20   Ht 5\' 6"  (1.676 m)   Wt 71.2 kg   SpO2 96%   BMI  25.34 kg/m   Physical Exam Vitals signs and nursing note reviewed.  Constitutional:      Appearance: He is well-developed.  HENT:     Head: Normocephalic and atraumatic.  Eyes:     Conjunctiva/sclera: Conjunctivae normal.  Neck:     Musculoskeletal: Neck supple.  Cardiovascular:     Rate and Rhythm: Normal rate and regular rhythm.     Heart sounds: No murmur.  Pulmonary:     Effort: Pulmonary effort is normal. No respiratory distress.     Breath sounds: Normal breath sounds.  Abdominal:     Palpations: Abdomen is soft.     Tenderness: There is no abdominal tenderness.  Musculoskeletal: Normal range of motion.        General: Tenderness present.     Comments: Left antecubital area where he had his surgery.  Full range of motion without any bony tenderness.  Skin:    General: Skin is warm and dry.     Capillary Refill: Capillary refill takes less than 2 seconds.  Neurological:     General: No focal deficit present.     Mental Status: He is alert and oriented to person, place, and time.     Sensory: No sensory deficit.     Motor: No weakness.      ED Treatments / Results  Labs (all labs ordered are listed, but only abnormal results are displayed) Labs Reviewed  BASIC METABOLIC PANEL - Abnormal; Notable for the following components:      Result Value   Sodium 133 (*)     Potassium 6.6 (*)    CO2 21 (*)    Glucose, Bld 280 (*)    BUN 60 (*)    Creatinine, Ser 4.31 (*)    GFR calc non Af Amer 12 (*)    GFR calc Af Amer 14 (*)    All other components within normal limits  CBC WITH DIFFERENTIAL/PLATELET - Abnormal; Notable for the following components:   WBC 15.8 (*)    RBC 2.66 (*)    Hemoglobin 8.1 (*)    HCT 26.8 (*)    MCV 100.8 (*)    Neutro Abs 15.1 (*)    Lymphs Abs 0.4 (*)    Abs Immature Granulocytes 0.10 (*)    All other components within normal limits  BASIC METABOLIC PANEL - Abnormal; Notable for the following components:   Potassium 5.4 (*)    CO2 21 (*)    Glucose, Bld 307 (*)    BUN 58 (*)    Creatinine, Ser 4.32 (*)    GFR calc non Af Amer 12 (*)    GFR calc Af Amer 14 (*)    All other components within normal limits  CBC - Abnormal; Notable for the following components:   WBC 10.7 (*)    RBC 2.25 (*)    Hemoglobin 6.9 (*)    HCT 22.1 (*)    All other components within normal limits  BASIC METABOLIC PANEL - Abnormal; Notable for the following components:   CO2 19 (*)    Glucose, Bld 251 (*)    BUN 60 (*)    Creatinine, Ser 4.32 (*)    GFR calc non Af Amer 12 (*)    GFR calc Af Amer 14 (*)    All other components within normal limits  IRON AND TIBC - Abnormal; Notable for the following components:   Iron 14 (*)    TIBC 188 (*)    Saturation  Ratios 7 (*)    All other components within normal limits  GLUCOSE, CAPILLARY - Abnormal; Notable for the following components:   Glucose-Capillary 258 (*)    All other components within normal limits  GLUCOSE, CAPILLARY - Abnormal; Notable for the following components:   Glucose-Capillary 183 (*)    All other components within normal limits  SARS CORONAVIRUS 2 (HOSPITAL ORDER, [facility])  POTASSIUM  FERRITIN  HEMOGLOBIN A1C  TYPE AND SCREEN  PREPARE RBC (CROSSMATCH)  ABO/RH  PREPARE RBC (CROSSMATCH)    EKG EKG Interpretation  Date/Time:   Thursday December 26 2018 17:31:23 EDT Ventricular Rate:  61 PR Interval:    QRS Duration: 76 QT Interval:  411 QTC Calculation: 414 R Axis:   -26 Text Interpretation:  Sinus rhythm Abnormal R-wave progression, early transition Inferior infarct, old similar to prior 5/20 Confirmed by Aletta Edouard [id number]) on 12/26/2018 6:29:02 PM   Radiology No results found.  Procedures .Critical Care Performed by: Hayden Rasmussen, MD Authorized by: Hayden Rasmussen, MD   Critical care provider statement:    Critical care time (minutes):  45   Critical care time was exclusive of:  Separately billable procedures and treating other patients   Critical care was necessary to treat or prevent imminent or life-threatening deterioration of the following conditions:  Metabolic crisis   Critical care was time spent personally by me on the following activities:  Discussions with consultants, evaluation of patient's response to treatment, examination of patient, ordering and performing treatments and interventions, ordering and review of laboratory studies, ordering and review of radiographic studies, pulse oximetry, re-evaluation of patient's condition, obtaining history from patient or surrogate, review of old charts and development of treatment plan with patient or surrogate   I assumed direction of critical care for this patient from another provider in my specialty: no     (including critical care time)  Medications Ordered in ED Medications  acetaminophen (TYLENOL) tablet 650 mg (has no administration in time range)    Or  acetaminophen (TYLENOL) suppository 650 mg (has no administration in time range)  ondansetron (ZOFRAN) tablet 4 mg (has no administration in time range)    Or  ondansetron (ZOFRAN) injection 4 mg (has no administration in time range)  insulin aspart (novoLOG) injection 0-9 Units (2 Units Subcutaneous Given 12/27/18 0621)  insulin aspart (novoLOG) injection 0-5 Units (3 Units  Subcutaneous Given 12/26/18 2252)  oxyCODONE-acetaminophen (PERCOCET/ROXICET) 5-325 MG per tablet 1 tablet (has no administration in time range)  predniSONE (DELTASONE) tablet 5 mg (5 mg Oral Given 12/27/18 0938)  cycloSPORINE modified (NEORAL) capsule 50 mg (50 mg Oral Given 12/27/18 0938)  0.9 %  sodium chloride infusion ( Intravenous Rate/Dose Verify 12/27/18 0400)  sodium bicarbonate tablet 1,300 mg (1,300 mg Oral Given 12/27/18 0938)  insulin aspart (novoLOG) injection 5 Units (5 Units Intravenous Given 12/26/18 2015)    And  dextrose 50 % solution 50 mL (50 mLs Intravenous Given 12/26/18 2015)  sodium zirconium cyclosilicate (LOKELMA) packet 10 g (10 g Oral Given 12/26/18 2029)  sodium bicarbonate injection 50 mEq (50 mEq Intravenous Given 12/26/18 2015)  calcium gluconate 1 g/ 50 mL sodium chloride IVPB (0 g Intravenous Stopped 12/26/18 2112)  Darbepoetin Alfa (ARANESP) injection 40 mcg (40 mcg Subcutaneous Given 12/26/18 2253)  0.9 %  sodium chloride infusion (Manually program via Guardrails IV Fluids) ( Intravenous New Bag/Given 12/27/18 0608)  furosemide (LASIX) injection 40 mg (40 mg Intravenous Given 12/27/18 0838)  Initial Impression / Assessment and Plan / ED Course  I have reviewed the triage vital signs and the nursing notes.  Pertinent labs & imaging results that were available during my care of the patient were reviewed by me and considered in my medical decision making (see chart for details).  Clinical Course as of Dec 26 952  Thu Dec 26, 6838  542 78 year old male with syncopal event after being discharged from outpatient surgery.  He feels well now.  Checking blood work and EKG.  Differential diagnosis includes vasovagal, hypovolemia, anesthesia post reaction, arrhythmia.   [MB]  1954 Patient's potassium comes back at 6.6.  He does not have any evidence of hyperkalemia on the EKG.  He is a kidney transplant that is failing and that was why he was getting his dialysis fistula worked  on today by Dr. Donzetta Matters.  Will review with nephrology for recommendations.   [MB]  2004 Discussed with Dr. Royce Macadamia from renal who is recommending giving calcium giving bicarb along with the insulin D50 and the Lokelma.  She is also recommending that he be admitted to the hospital for management of this.   [MB]    Clinical Course User Index [MB] Hayden Rasmussen, MD        Final Clinical Impressions(s) / ED Diagnoses   Final diagnoses:  Syncope and collapse  Hyperkalemia  Chronic kidney disease (CKD), stage IV (severe) [facility])    ED Discharge Orders    None       Hayden Rasmussen, MD 12/27/18 [phone]

## 2018-12-26 NOTE — ED Notes (Signed)
Attempted report 

## 2018-12-26 NOTE — H&P (Signed)
History and Physical    Caleb Hicks:[id number] DOB: 07-25-1940 DOA: 12/26/2018  PCP: [name], MD  Patient coming from: Home  I have personally briefly reviewed patient's old medical records in [place]  Chief Complaint: Syncope  HPI: Caleb Hicks is a 78 y.o. 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  Patient had fistula created in LUE earlier today, there was some blood loss noted due to bleeding collaterals during surgery according to op note.  After leaving hospital to go home, initially felt okay but then on exiting the car to walk into the house he felt lightheaded and then fell down to the ground.  He says he quickly regained consciousness and now does not have any complaints.  No current CP, SOB, lightheadedness, weakness, etc.  Mild pain at fistula site but no obvious swelling, bleeding, numbness or weakness in hand muscles.   ED Course: K 6.6 up from 5.2 pre-op today.  HGB 8.1 down from 10.9 pre-op today.  EKG without any worrisome changes associated with hyperkalemia.   Review of Systems: As per HPI, otherwise all review of systems negative.  Past Medical History:  Diagnosis Date  . AAA (abdominal aortic aneurysm) ([place]): 4 cm 03/24/2017   3.5X 3. 5 CM PER 10-6 ABDOMINAL CT FOLOOWED EVRY 2 YEARS   . Abdominal aneurysm ([place])   . Acute kidney injury superimposed on CKD [facility]): Stage 4 03/24/2017  . Anemia   . Arthritis   . Cancer ([place])    skin cancer - basal cell  . Diverticulitis 03/2017   1 UNIT BLOOD GIVEN  . Gout   . Hepatic cyst: Solitary 03/24/2017  . Hepatitis    Hepatitis A  . Hydrocele sac   . Hyperlipidemia 03/24/2017  . Hypertension   . Hyperuricemia 03/24/2017  . 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 Pre-diabetes    Pt states he has "high blood sugar" but is not diabetic or pre-diabetic. He states he hasn't had an A1C  done. He does take Januvia daily.  . 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 AV FISTULA PLACEMENT Left 11/12/2018   Procedure: BRACHIOBASILIC ARTERIOVENOUS (AV) FISTULA CREATION LEFT ARM;  Surgeon: Waynetta Sandy, MD;  Location: [facility];  Service: Vascular;  Laterality: Left;  . COLONOSCOPY    . CYST REMOVED FROM SPINE  YRS AGO  . HERNIA REPAIR     SEVERAL  . HYDROCELE EXCISION Bilateral 06/04/2017   Procedure: BILATERAL HYDROCELECTOMY ADULT;  Surgeon: Lucas Mallow, MD;  Location: [facility];  Service: Urology;  Laterality: Bilateral;  . KIDNEY TRANSPLANT    . PILIODINAL CYST  40-50 YRS AGO     reports that he has quit smoking. His smoking use included cigarettes. He has a 40.00 pack-year smoking history. He has never used smokeless tobacco. He reports that he does not drink alcohol or use drugs.  Allergies  Allergen Reactions  . Norvasc [Amlodipine Besylate] Other (See Comments)    Weight gain  . Tape Other (See Comments)    Tears skin    Family History  Problem Relation Age of Onset  . Heart attack Father   . AAA (abdominal aortic aneurysm) Brother   . Cerebral aneurysm Brother      Prior to Admission medications   Medication  Sig Start Date End Date Taking? Authorizing Provider  allopurinol (ZYLOPRIM) 100 MG tablet Take 200 mg by mouth daily.  03/13/17   [provider]  aspirin 325 MG EC tablet Take 325 mg by mouth daily.    [provider]  atorvastatin (LIPITOR) 40 MG tablet Take 1 tablet (40 mg total) by mouth daily. Patient not taking: Reported on 12/13/2018 05/14/18   [name], MD  calcitRIOL (ROCALTROL) 0.25 MCG capsule Take 1 capsule (0.25 mcg total) by mouth daily. Patient taking differently: Take 0.25 mcg by mouth every other day.  11/24/17   Charlynne Cousins, MD  calcium carbonate (OS-CAL - DOSED IN MG OF ELEMENTAL CALCIUM) 1250  (500 Ca) MG tablet Take 1-2 tablets by mouth See admin instructions. 2 tablets in the morning, and 1 tablet in the evening    [provider]  cycloSPORINE modified (NEORAL) 25 MG capsule Take 3 capsules (75 mg total) by mouth daily. Patient taking differently: Take 50 mg by mouth 2 (two) times daily.  11/24/17   Charlynne Cousins, MD  furosemide (LASIX) 40 MG tablet Take 1 tablet (40 mg total) by mouth daily. 11/27/17   Charlynne Cousins, MD  metoprolol tartrate (LOPRESSOR) 50 MG tablet Take 50 mg by mouth 2 (two) times daily. 12/25/16   [provider]  oxyCODONE-acetaminophen (PERCOCET/ROXICET) 5-325 MG tablet Take 1 tablet by mouth every 6 (six) hours as needed. 12/26/18   Dagoberto Ligas, PA-C  predniSONE (DELTASONE) 5 MG tablet Take 5 mg by mouth daily. 03/16/17   [provider]  senna-docusate (SENOKOT-S) 8.6-50 MG tablet Take 1 tablet by mouth at bedtime. Patient taking differently: Take 1 tablet by mouth at bedtime as needed for moderate constipation.  03/28/17   Eugenie Filler, MD  sitaGLIPtin (JANUVIA) 25 MG tablet Take 1 tablet (25 mg total) by mouth daily. 05/14/18   [name], MD  sodium bicarbonate 650 MG tablet Take 1,300 mg by mouth 2 (two) times daily.    [provider]  telmisartan (MICARDIS) 80 MG tablet Take 1 tablet (80 mg total) by mouth daily. 12/04/17   Charlynne Cousins, MD    Physical Exam: Vitals:   12/26/18 1945 12/26/18 2000 12/26/18 2015 12/26/18 2100  BP: 132/69 (!) 124/49 (!) 140/51 (!) 146/54  Pulse: (!) 56 (!) 57 (!) 57 72  Resp: (!) 24 19 15  (!) 23  Temp:      TempSrc:      SpO2: 96% 97% 98% 98%  Weight:      Height:        Constitutional: NAD, calm, comfortable Eyes: PERRL, lids and conjunctivae normal ENMT: Mucous membranes are moist. Posterior pharynx clear of any exudate or lesions.Normal dentition.  Neck: normal, supple, no masses, no thyromegaly Respiratory: clear to auscultation bilaterally, no  wheezing, no crackles. Normal respiratory effort. No accessory muscle use.  Cardiovascular: Regular rate and rhythm, no murmurs / rubs / gallops. No extremity edema. 2+ pedal pulses. No carotid bruits.  Abdomen: no tenderness, no masses palpated. No hepatosplenomegaly. Bowel sounds positive.  Musculoskeletal: RUE dressing in place, dressing C/D/I Skin: no rashes, lesions, ulcers. No induration Neurologic: CN 2-12 grossly intact. Sensation intact, DTR normal. Strength 5/5 in all 4.  Psychiatric: Normal judgment and insight. Alert and oriented x 3. Normal mood.    Labs on Admission: I have personally reviewed following labs and imaging studies  CBC: Recent Labs  Lab 12/26/18 1020 12/26/18 1930  WBC  --  15.8*  NEUTROABS  --  15.1*  HGB 10.9* 8.1*  HCT 32.0* 26.8*  MCV  --  100.8*  PLT  --  542   Basic Metabolic Panel: Recent Labs  Lab 12/26/18 1020 12/26/18 1930  NA 135 133*  K 5.2* 6.6*  CL  --  103  CO2  --  21*  GLUCOSE 112* 280*  BUN  --  60*  CREATININE  --  4.31*  CALCIUM  --  9.1   GFR: Estimated Creatinine Clearance: 12.7 mL/min (A) (by C-G formula based on SCr of 4.31 mg/dL (H)). Liver Function Tests: No results for input(s): AST, ALT, ALKPHOS, BILITOT, PROT, ALBUMIN in the last 168 hours. No results for input(s): LIPASE, AMYLASE in the last 168 hours. No results for input(s): AMMONIA in the last 168 hours. Coagulation Profile: No results for input(s): INR, PROTIME in the last 168 hours. Cardiac Enzymes: No results for input(s): CKTOTAL, CKMB, CKMBINDEX, TROPONINI in the last 168 hours. BNP (last 3 results) No results for input(s): PROBNP in the last 8760 hours. HbA1C: No results for input(s): HGBA1C in the last 72 hours. CBG: Recent Labs  Lab 12/26/18 0943 12/26/18 1148  GLUCAP 117* 124*   Lipid Profile: No results for input(s): CHOL, HDL, LDLCALC, TRIG, CHOLHDL, LDLDIRECT in the last 72 hours. Thyroid Function Tests: No results for input(s): TSH,  T4TOTAL, FREET4, T3FREE, THYROIDAB in the last 72 hours. Anemia Panel: No results for input(s): VITAMINB12, FOLATE, FERRITIN, TIBC, IRON, RETICCTPCT in the last 72 hours. Urine analysis:    Component Value Date/Time   COLORURINE YELLOW 04/09/2018 [place] 04/09/2018 1449   LABSPEC 1.011 04/09/2018 1449   PHURINE 5.0 04/09/2018 1449   GLUCOSEU NEGATIVE 04/09/2018 1449   HGBUR MODERATE (A) 04/09/2018 1449   BILIRUBINUR NEGATIVE 04/09/2018 1449   KETONESUR NEGATIVE 04/09/2018 1449   PROTEINUR NEGATIVE 04/09/2018 1449   NITRITE NEGATIVE 04/09/2018 1449   LEUKOCYTESUR NEGATIVE 04/09/2018 1449    Radiological Exams on Admission: No results found.  EKG: Independently reviewed.  Assessment/Plan Principal Problem:   Hyperkalemia Active Problems:   Acute blood loss as cause of postoperative anemia   HTN (hypertension)   Renal transplant, status post: 21 years ago: 1997   Syncope   DM2 (diabetes mellitus, type 2) ([place])    1. Hyperkalemia - 1. Dr. Royce Macadamia consulted and putting in note 2. Got: calcium gluconate, insulin, D50, amp of bicarb, and 10g lokelma in ED. 3. Repeat K with BMP now and at 0500, Repeat K also at 0100 and 0900 4. Tele monitor 2. Acute blood loss anemia with surgery - 1. HGB 8.1 down from 10.9 pre-op 2. Asymptomatic at rest now 3. No obvious ongoing bleeding 4. Will hold off on transfusion for the moment  5. Repeat CBC in AM 3. Syncope - 1. likely due to acute blood loss anemia with surgery 2. HyperK also a possibility 3. See above regarding each 4. Not currently planning on ordering 2D Echo given the high chance that this is related to todays surgery, blood loss. 4. HTN - 1. DC telmisartan 2. Holding lasix due to syncope and acute blood loss 3. Resume metoprolol tomorrow AM, SBP 140s in ED. 5. CKD stage 5 of allograft - 1. Continue Prednisone and Neoral 2. Current hope is to temporarize and prevent need for emergent dialysis per Dr.  Luis Abed note 6. Metabolic acidosis - 1. Continue PO BICARB 7. DM2 - 1. Hold Januvia 2. Sensitive SSI AC/HS  DVT prophylaxis: SCDs -  just had surgery with blood loss today Code Status: DNR - confirmed by patient Family Communication: No family in room Disposition Plan: Home after admit Consults called: Nephrology Admission status: Admit to inpatient  Severity of Illness: The appropriate patient status for this patient is INPATIENT. Inpatient status is judged to be reasonable and necessary in order to provide the required intensity of service to ensure the patient's safety. The patient's presenting symptoms, physical exam findings, and initial radiographic and laboratory data in the context of their chronic comorbidities is felt to place them at high risk for further clinical deterioration. Furthermore, it is not anticipated that the patient will be medically stable for discharge from the hospital within 2 midnights of admission. The following factors support the patient status of inpatient.   IP status for syncope associated with hyperkalemia with K 6.6, acute blood loss anemia.   * I certify that at the point of admission it is my clinical judgment that the patient will require inpatient hospital care spanning beyond 2 midnights from the point of admission due to high intensity of service, high risk for further deterioration and high frequency of surveillance required.*    GARDNER, JARED M. DO [facility]  How to contact the [facility] Attending or Consulting provider [place] or covering provider during after hours [place], for this patient?  1. Check the care team in [facility] and look for a) attending/consulting [facility] provider listed and b) the [facility] team listed 2. Log into [email or web address]  Amion Physician Scheduling and messaging for groups and whole hospitals  On call and physician scheduling software for group practices, residents, hospitalists and other medical providers for call, clinic,  rotation and shift schedules. On[employer] is a hospital-wide system for scheduling doctors and paging doctors on call. EasyPlot is for scientific plotting and data analysis.  [email or web address]  and use [place] universal password to access. If you do not have the password, please contact the hospital operator.  3. Locate the [facility] provider you are looking for under [facility] and page to a number that you can be directly reached. 4. If you still have difficulty reaching the provider, please page the [employer] (Director on Call) for the Hospitalists listed on amion for assistance.  12/26/2018, 9:27 PM

## 2018-12-26 NOTE — Op Note (Signed)
    Patient name: Caleb Hicks MRN: [id number] DOB: Sep 10, 1940 Sex: male  12/26/2018 Pre-operative Diagnosis: Chronic kidney disease Post-operative diagnosis:  Same Surgeon:  Eda Paschal. Donzetta Matters, MD Assistant Arlee Muslim, [place] Procedure Performed: 1.  Harvest of left arm basilic vein fistula 2.  Placement of left arm 4-7 mm PTFE graft  Indications: 78 year old male with chronic kidney disease.  He is undergone for stage basilic vein fistula creation is now indicated for the second stage.  Findings: Vein was very thin was torn on mobilization.  We placed an interposition graft.  At completion he had a weakly palpable radial pulse strong thrill in the graft.  Radial signal augmented with compression of the graft   Procedure:  The patient was identified in the holding area and taken to the operating room where is placed upon operative when general anesthesia induced.  He was sterilely prepped draped left upper extremity usual fashion antibiotics were minister and timeout called.  We began with longitudinal incision overlying the palpable fistula.  We dissected out the fistula.  Unfortunately were multiple collateral veins that did bleed significantly.  We protected the nerve although one branch was divided between clips.  A second incision was made towards the axilla.  In mobilizing the fistula we did have a tear and was clamped.  We dissected back to the brachial artery placed Vesseloops around both sides and clamped it.  We transected the fistula.  In flushing it we attempted to repair but could not.  We transected and near the axilla.  We dissected back to the previous anastomosis.  I flushed the artery with heparinized saline both directions.  We then tunneled 4-7 mm graft.  7 mm and was trimmed to size and sewn end-to-end to the existing fistula near the axilla.  This was done with 6-0 Prolene suture.  1 completion we then flushed through the graft.  We tunneled this retrograde.  Trim the graft to size  and sewed it end-to-side to the artery.  Prior to completion his next muscular flushing all directions.  Upon completion there was a very strong thrill in the graft.  There was a weakly palpable radial pulse confirmed with Doppler.  Radial signal did augment with compression of the graft.  Satisfied with this cutaneous stasis irrigated the wounds closed in layers Vicryl and Monocryl.  Dermabond placed to level skin.  He was awakened anesthesia having tolerated procedure without immediate complication.  All counts were correct at completion.  EBL: 250 cc   Brandon C. Donzetta Matters, MD [facility] Office: [phone] Pager: [phone]

## 2018-12-26 NOTE — Consult Note (Signed)
[facility] Renal Consultation Note  Requesting MD: Aletta Edouard, MD  Indication for Consultation:  Hyperkalemia, CKD   Chief complaint: "passed out at home"  HPI:  Caleb Hicks is a 78 y.o. 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  He follows with Dr. Dr. [name].  He had previously undergone basilic vein fistula creation and came in for 2nd stage; he had placement of interposition graft earlier today.  After leaving he became lightheaded and fell.  He was hoping that this may have been just related to anesthesia however labs were checked in the ER and his potassium returned at 6.6.  With EKG unremarkable.  Lab trends as below.  Also for reference labs on 12/09/2018 at [facility] with creatinine 3.89, BUN 63, potassium 6.2, GFR 14, PTH 68, bicarbonate 28, hemoglobin 10.3, cyclosporine level 164.  From the clinic some of his pertinent medications are charted as lasix 40 mg daily, prednisone 5 mg daily, telmisartan 80 mg daily, Neoral 50 mg twice daily, and calcitriol 0.25 mcg every other day.  He does still make a good amount of urine.  He has worsening of his chronic anemia today and states that he is not receiving retacrit or other ESA.   SARS Coronavirus 2 negative   Creatinine, Ser  Date/Time Value Ref Range Status  12/26/2018 07:30 PM 4.31 (H) 0.61 - 1.24 mg/dL Final  04/09/2018 01:16 PM 3.62 (H) 0.61 - 1.24 mg/dL Final  11/24/2017 05:31 AM 4.56 (H) 0.61 - 1.24 mg/dL Final  11/23/2017 05:09 AM 4.56 (H) 0.61 - 1.24 mg/dL Final  11/22/2017 05:07 AM 4.79 (H) 0.61 - 1.24 mg/dL Final  11/21/2017 04:52 AM 4.98 (H) 0.61 - 1.24 mg/dL Final  11/20/2017 05:24 AM 5.30 (H) 0.61 - 1.24 mg/dL Final  11/19/2017 05:34 AM 4.99 (H) 0.61 - 1.24 mg/dL Final  11/18/2017 05:09 AM 4.70 (H) 0.61 - 1.24 mg/dL Final  11/17/2017 05:38 AM 4.49 (H) 0.61 - 1.24 mg/dL Final  11/16/2017 05:43 AM 4.43 (H) 0.61 - 1.24 mg/dL  Final  11/15/2017 05:36 AM 4.40 (H) 0.61 - 1.24 mg/dL Final  11/14/2017 05:17 AM 4.17 (H) 0.61 - 1.24 mg/dL Final  11/13/2017 05:29 AM 4.31 (H) 0.61 - 1.24 mg/dL Final  11/12/2017 05:14 AM 4.34 (H) 0.61 - 1.24 mg/dL Final  11/11/2017 05:48 AM 4.25 (H) 0.61 - 1.24 mg/dL Final  11/10/2017 12:00 PM 3.99 (H) 0.61 - 1.24 mg/dL Final  11/10/2017 05:16 AM 3.80 (H) 0.61 - 1.24 mg/dL Final  11/09/2017 07:52 AM 3.57 (H) 0.61 - 1.24 mg/dL Final  11/09/2017 05:49 AM 3.49 (H) 0.61 - 1.24 mg/dL Final  11/08/2017 04:30 AM 2.86 (H) 0.61 - 1.24 mg/dL Final  11/07/2017 04:39 PM 2.54 (H) 0.61 - 1.24 mg/dL Final  06/04/2017 07:55 AM 2.10 (H) 0.61 - 1.24 mg/dL Final  03/28/2017 06:16 AM 2.92 (H) 0.61 - 1.24 mg/dL Final  03/27/2017 03:30 AM 2.67 (H) 0.61 - 1.24 mg/dL Final  03/26/2017 03:47 AM 3.02 (H) 0.61 - 1.24 mg/dL Final  03/25/2017 07:06 AM 3.12 (H) 0.61 - 1.24 mg/dL Final  03/24/2017 01:48 PM 3.35 (H) 0.61 - 1.24 mg/dL Final     PMHx:   Past Medical History:  Diagnosis Date  . AAA (abdominal aortic aneurysm) ([place]): 4 cm 03/24/2017   3.5X 3. 5 CM PER 10-6 ABDOMINAL CT FOLOOWED EVRY 2 YEARS   . Abdominal aneurysm ([place])   . Acute kidney injury superimposed on  CKD ([place]): Stage 4 03/24/2017  . Anemia   . Arthritis   . Cancer ([place])    skin cancer - basal cell  . Diverticulitis 03/2017   1 UNIT BLOOD GIVEN  . Gout   . Hepatic cyst: Solitary 03/24/2017  . Hepatitis    Hepatitis A  . Hydrocele sac   . Hyperlipidemia 03/24/2017  . Hypertension   . Hyperuricemia 03/24/2017  . 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 Pre-diabetes    Pt states he has "high blood sugar" but is not diabetic or pre-diabetic. He states he hasn't had an A1C done. He does take Januvia daily.  . 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 AV FISTULA PLACEMENT Left 11/12/2018   Procedure: BRACHIOBASILIC ARTERIOVENOUS (AV) FISTULA CREATION LEFT ARM;  Surgeon:  Waynetta Sandy, MD;  Location: [facility];  Service: Vascular;  Laterality: Left;  . COLONOSCOPY    . CYST REMOVED FROM SPINE  YRS AGO  . HERNIA REPAIR     SEVERAL  . HYDROCELE EXCISION Bilateral 06/04/2017   Procedure: BILATERAL HYDROCELECTOMY ADULT;  Surgeon: Lucas Mallow, MD;  Location[facility];  Service: Urology;  Laterality: Bilateral;  . KIDNEY TRANSPLANT    . PILIODINAL CYST  40-50 YRS AGO    Family Hx:  Family History  Problem Relation Age of Onset  . Heart attack Father   . AAA (abdominal aortic aneurysm) Brother   . Cerebral aneurysm Brother     Social History:  reports that he has quit smoking. His smoking use included cigarettes. He has a 40.00 pack-year smoking history. He has never used smokeless tobacco. He reports that he does not drink alcohol or use drugs.  Allergies:  Allergies  Allergen Reactions  . Norvasc [Amlodipine Besylate] Other (See Comments)    Weight gain  . Tape Other (See Comments)    Tears skin    Medications: Prior to Admission medications   Medication Sig Start Date End Date Taking? Authorizing Provider  allopurinol (ZYLOPRIM) 100 MG tablet Take 200 mg by mouth daily.  03/13/17   [provider]  aspirin 325 MG EC tablet Take 325 mg by mouth daily.    [provider]  atorvastatin (LIPITOR) 40 MG tablet Take 1 tablet (40 mg total) by mouth daily. Patient not taking: Reported on 12/13/2018 05/14/18   [name], MD  calcitRIOL (ROCALTROL) 0.25 MCG capsule Take 1 capsule (0.25 mcg total) by mouth daily. Patient taking differently: Take 0.25 mcg by mouth every other day.  11/24/17   Charlynne Cousins, MD  calcium carbonate (OS-CAL - DOSED IN MG OF ELEMENTAL CALCIUM) 1250 (500 Ca) MG tablet Take 1-2 tablets by mouth See admin instructions. 2 tablets in the morning, and 1 tablet in the evening    [provider]  cycloSPORINE modified (NEORAL) 25 MG capsule Take 3 capsules (75 mg total)  by mouth daily. Patient taking differently: Take 50 mg by mouth 2 (two) times daily.  11/24/17   Charlynne Cousins, MD  furosemide (LASIX) 40 MG tablet Take 1 tablet (40 mg total) by mouth daily. 11/27/17   Charlynne Cousins, MD  metoprolol tartrate (LOPRESSOR) 50 MG tablet Take 50 mg by mouth 2 (two) times daily. 12/25/16   [provider]  oxyCODONE-acetaminophen (PERCOCET/ROXICET) 5-325 MG tablet Take 1 tablet by mouth every 6 (six) hours as needed. 12/26/18   Dagoberto Ligas, PA-C  predniSONE (DELTASONE) 5 MG tablet Take 5 mg by mouth daily. 03/16/17   [provider]  senna-docusate (SENOKOT-S) 8.6-50 MG tablet Take 1 tablet by mouth at bedtime. Patient taking differently: Take 1 tablet by mouth at bedtime as needed for moderate constipation.  03/28/17   Eugenie Filler, MD  sitaGLIPtin (JANUVIA) 25 MG tablet Take 1 tablet (25 mg total) by mouth daily. 05/14/18   [name], MD  sodium bicarbonate 650 MG tablet Take 1,300 mg by mouth 2 (two) times daily.    [provider]  telmisartan (MICARDIS) 80 MG tablet Take 1 tablet (80 mg total) by mouth daily. 12/04/17   Charlynne Cousins, MD    I have reviewed the patient's current medications.  Labs:  BMP Latest Ref Rng & Units 12/26/2018 12/26/2018 11/12/2018  Glucose 70 - 99 mg/dL 280(H) 112(H) 109(H)  BUN 8 - 23 mg/dL 60(H) - -  Creatinine 0.61 - 1.24 mg/dL 4.31(H) - -  Sodium 135 - 145 mmol/L 133(L) 135 137  Potassium 3.5 - 5.1 mmol/L 6.6(HH) 5.2(H) 4.6  Chloride 98 - 111 mmol/L 103 - -  CO2 22 - 32 mmol/L 21(L) - -  Calcium 8.9 - 10.3 mg/dL 9.1 - -    Urinalysis    Component Value Date/Time   COLORURINE YELLOW 04/09/2018 [place] 04/09/2018 1449   LABSPEC 1.011 04/09/2018 1449   PHURINE 5.0 04/09/2018 1449   GLUCOSEU NEGATIVE 04/09/2018 1449   HGBUR MODERATE (A) 04/09/2018 1449   BILIRUBINUR NEGATIVE 04/09/2018 1449   KETONESUR NEGATIVE 04/09/2018 1449   PROTEINUR NEGATIVE  04/09/2018 1449   NITRITE NEGATIVE 04/09/2018 1449   LEUKOCYTESUR NEGATIVE 04/09/2018 1449     ROS:  Pertinent items noted in HPI and remainder of comprehensive ROS otherwise negative.  Physical Exam: Vitals:   12/26/18 2015 12/26/18 2100  BP: (!) 140/51 (!) 146/54  Pulse: (!) 57 72  Resp: 15 (!) 23  Temp:    SpO2: 98% 98%     General: adult male in stretcher in NAD HEENT:NCAT  Eyes:EOMI sclera anicteric Neck: supple trachea midline Heart: S1S2; no rub  Lungs: clear to auscultation; normal work of breathing at rest  Abdomen: softly distended/NT; normal bowel sounds Extremities: LLE with 1+ edema; no RLE edema (pt states discrepancy at baseline) Skin: LUE is wrapped  Neuro: alert and oriented x3 conversant and provides history Access   Assessment/Plan:  # Hyperkalemia - Discussed with the ER and recommend calcium gluconate, 1 amp of bicarbonate, 5 units of insulin and an amp of D50.  They have given Lokelma  - I have ordered repeat BMP to trend response - Would discontinue telmisartan  - normal saline at 75/hr x 8 hours and reassess   # CKD stage V - Pt had previously undergone basilic vein fistula creation and came in for 2nd stage; he had placement of interposition graft on 7/9. - Monitoring for dialysis needs.  Hopefully able to temporize with medical measures   # Syncope  - Gentle IVF - 75 /hr normal saline x 8 hours - Additional imaging per primary team/ER discretion  # Renal transplant - With CKD of the allograft as above  # Immunosuppression - For now continue home regimen of prednisone 5 mg daily and neoral 50 mg BID   # Metabolic acidosis - Would continue oral bicarbonate   # Chronic diastolic CHF  - Compensated on exam   # Anemia CKD  - Update iron stores - Will administer  aranesp once   # Secondary HPT - Would hold calcitriol for now - PTH < 100 last check   Patient is to be admitted   Claudia Desanctis 12/26/2018, 9:16 PM

## 2018-12-26 NOTE — Discharge Instructions (Signed)
° °  [facility] ° °Discharge Instructions ° °AV Fistula or Graft Surgery for Dialysis Access ° °Please refer to the following instructions for your post-procedure care. Your surgeon or physician assistant will discuss any changes with you. ° °Activity ° °You may drive the day following your surgery, if you are comfortable and no longer taking prescription pain medication. Resume full activity as the soreness in your incision resolves. ° °Bathing/Showering ° °You may shower after you go home. Keep your incision dry for 48 hours. Do not soak in a bathtub, hot tub, or swim until the incision heals completely. You may not shower if you have a hemodialysis catheter. ° °Incision Care ° °Clean your incision with mild soap and water after 48 hours. Pat the area dry with a clean towel. You do not need a bandage unless otherwise instructed. Do not apply any ointments or creams to your incision. You may have skin glue on your incision. Do not peel it off. It will come off on its own in about one week. Your arm may swell a bit after surgery. To reduce swelling use pillows to elevate your arm so it is above your heart. Your doctor will tell you if you need to lightly wrap your arm with an ACE bandage. ° °Diet ° °Resume your normal diet. There are not special food restrictions following this procedure. In order to heal from your surgery, it is CRITICAL to get adequate nutrition. Your body requires vitamins, minerals, and protein. Vegetables are the best source of vitamins and minerals. Vegetables also provide the perfect balance of protein. Processed food has little nutritional value, so try to avoid this. ° °Medications ° °Resume taking all of your medications. If your incision is causing pain, you may take over-the counter pain relievers such as acetaminophen (Tylenol). If you were prescribed a stronger pain medication, please be aware these medications can cause nausea and constipation. Prevent  nausea by taking the medication with a snack or meal. Avoid constipation by drinking plenty of fluids and eating foods with high amount of fiber, such as fruits, vegetables, and grains. Do not take Tylenol if you are taking prescription pain medications. ° ° ° ° °Follow up °Your surgeon may want to see you in the office following your access surgery. If so, this will be arranged at the time of your surgery. ° °Please call us immediately for any of the following conditions: ° °Increased pain, redness, drainage (pus) from your incision site °Fever of 101 degrees or higher °Severe or worsening pain at your incision site °Hand pain or numbness. ° °Reduce your risk of vascular disease: ° °Stop smoking. If you would like help, call [facility] at 1-800-QUIT-NOW (1-800-784-8669) or [place] at [phone] ° °Manage your cholesterol °Maintain a desired weight °Control your diabetes °Keep your blood pressure down ° °Dialysis ° °It will take several weeks to several months for your new dialysis access to be ready for use. Your surgeon will determine when it is OK to use it. Your nephrologist will continue to direct your dialysis. You can continue to use your Permcath until your new access is ready for use. ° °If you have any questions, please call the office at [phone]. ° °

## 2018-12-27 ENCOUNTER — Encounter (HOSPITAL_COMMUNITY): Payer: Self-pay | Admitting: Vascular Surgery

## 2018-12-27 LAB — POTASSIUM: Potassium: 5 mmol/L (ref 3.5–5.1)

## 2018-12-27 LAB — ABO/RH: ABO/RH(D): O NEG

## 2018-12-27 LAB — GLUCOSE, CAPILLARY
Glucose-Capillary: 247 mg/dL — ABNORMAL HIGH (ref 70–99)
Glucose-Capillary: 293 mg/dL — ABNORMAL HIGH (ref 70–99)
Glucose-Capillary: 301 mg/dL — ABNORMAL HIGH (ref 70–99)
Glucose-Capillary: 183 mg/dL — ABNORMAL HIGH (ref 70–99)
Glucose-Capillary: 196 mg/dL — ABNORMAL HIGH (ref 70–99)
Glucose-Capillary: 178 mg/dL — ABNORMAL HIGH (ref 70–99)

## 2018-12-27 LAB — HEMOGLOBIN AND HEMATOCRIT, BLOOD
HCT: 27.5 % — ABNORMAL LOW (ref 39.0–52.0)
Hemoglobin: 9.2 g/dL — ABNORMAL LOW (ref 13.0–17.0)

## 2018-12-27 LAB — CBC
HCT: 22.1 % — ABNORMAL LOW (ref 39.0–52.0)
Hemoglobin: 6.9 g/dL — CL (ref 13.0–17.0)
MCH: 30.7 pg (ref 26.0–34.0)
MCHC: 31.2 g/dL (ref 30.0–36.0)
MCV: 98.2 fL (ref 80.0–100.0)
Platelets: 298 10*3/uL (ref 150–400)
RBC: 2.25 MIL/uL — ABNORMAL LOW (ref 4.22–5.81)
RDW: 14.4 % (ref 11.5–15.5)
WBC: 10.7 10*3/uL — ABNORMAL HIGH (ref 4.0–10.5)
nRBC: 0 % (ref 0.0–0.2)

## 2018-12-27 LAB — RENAL FUNCTION PANEL
Albumin: 2.7 g/dL — ABNORMAL LOW (ref 3.5–5.0)
Anion gap: 13 (ref 5–15)
BUN: 62 mg/dL — ABNORMAL HIGH (ref 8–23)
CO2: 22 mmol/L (ref 22–32)
Calcium: 8.9 mg/dL (ref 8.9–10.3)
Chloride: 102 mmol/L (ref 98–111)
Creatinine, Ser: 4.42 mg/dL — ABNORMAL HIGH (ref 0.61–1.24)
GFR calc Af Amer: 14 mL/min — ABNORMAL LOW (ref >60)
GFR calc non Af Amer: 12 mL/min — ABNORMAL LOW (ref >60)
Glucose, Bld: 180 mg/dL — ABNORMAL HIGH (ref 70–99)
Phosphorus: 4 mg/dL (ref 2.5–4.6)
Potassium: 5.1 mmol/L (ref 3.5–5.1)
Sodium: 137 mmol/L (ref 135–145)

## 2018-12-27 LAB — BASIC METABOLIC PANEL
Anion gap: 14 (ref 5–15)
BUN: 60 mg/dL — ABNORMAL HIGH (ref 8–23)
CO2: 19 mmol/L — ABNORMAL LOW (ref 22–32)
Calcium: 9 mg/dL (ref 8.9–10.3)
Chloride: 102 mmol/L (ref 98–111)
Creatinine, Ser: 4.32 mg/dL — ABNORMAL HIGH (ref 0.61–1.24)
GFR calc Af Amer: 14 mL/min — ABNORMAL LOW (ref >60)
GFR calc non Af Amer: 12 mL/min — ABNORMAL LOW (ref >60)
Glucose, Bld: 251 mg/dL — ABNORMAL HIGH (ref 70–99)
Potassium: 5.1 mmol/L (ref 3.5–5.1)
Sodium: 135 mmol/L (ref 135–145)

## 2018-12-27 LAB — IRON AND TIBC
Iron: 14 ug/dL — ABNORMAL LOW (ref 45–182)
Saturation Ratios: 7 % — ABNORMAL LOW (ref 17.9–39.5)
TIBC: 188 ug/dL — ABNORMAL LOW (ref 250–450)
UIBC: 174 ug/dL

## 2018-12-27 LAB — FERRITIN: Ferritin: 86 ng/mL (ref 24–336)

## 2018-12-27 LAB — PREPARE RBC (CROSSMATCH)

## 2018-12-27 MED ORDER — FUROSEMIDE 10 MG/ML IJ SOLN
40.0000 mg | Freq: Once | INTRAMUSCULAR | Status: AC
  Administered 2018-12-27: 09:00:00 40 mg via INTRAVENOUS
  Filled 2018-12-27: qty 4

## 2018-12-27 MED ORDER — SODIUM BICARBONATE 650 MG PO TABS
1300.0000 mg | ORAL_TABLET | Freq: Two times a day (BID) | ORAL | Status: DC
  Administered 2018-12-27 – 2018-12-30 (×7): 1300 mg via ORAL
  Filled 2018-12-27 (×7): qty 2

## 2018-12-27 MED ORDER — SODIUM CHLORIDE 0.9% IV SOLUTION
Freq: Once | INTRAVENOUS | Status: AC
  Administered 2018-12-27: 06:00:00 via INTRAVENOUS

## 2018-12-27 MED ORDER — SODIUM ZIRCONIUM CYCLOSILICATE 10 G PO PACK
10.0000 g | PACK | Freq: Two times a day (BID) | ORAL | Status: DC
  Administered 2018-12-27 – 2018-12-28 (×3): 10 g via ORAL
  Filled 2018-12-27 (×3): qty 1

## 2018-12-27 NOTE — Progress Notes (Addendum)
[facility] Progress Note    Assessment/ Plan:    # Hyperkalemia: s/p calcium gluconate, 1 amp of bicarbonate, 5 units of insulin and an amp of D50 and a dose of Lokelma, now down to 5 this AM.  Off ARB.  Anticipate K to rise in the setting of 2 u pRBCS, will reorder Lokelma 10 g BID.  D/c fluids.  PM renal function panel ordered.  # Acute blood loss anemia: EBL in OR 250 mL per note, Hgb as 10.9 on preop Hemoccue (could be falsely high), down to 6.9 on serum redraw this AM.  Will d/w Dr Donzetta Matters if imaging necessary at this time.  Got Aranesp x 1 last night Addend: d/w Dr. Donzetta Matters, anemia not unexpected given operative findings, no imaging necessary at this time.  # CKD stage V in renal transplant recipient: At baseline creatinine of 4.32; now that K successfully rx'd, no acute indications for HD at this time.  On pred 5 and cyclosporine 50 BID  # Syncope: possibly d/t ABLA, per primary  # Metabolic acidosis: oral Na bicarb 1300 BID  # Secondary HPT: holding calcitriol  Subjective:    K down to 5.0.  Getting 2 u pRBCs at present.  No complaints.  No pain in the arm.  Good grip strength.     Objective:   BP (!) 125/43 (BP Location: Left Leg)   Pulse 73   Temp 97.8 F (36.6 C) (Oral)   Resp (!) 25   Ht 5\' 6"  (1.676 m)   Wt 70.5 kg   SpO2 96%   BMI 25.08 kg/m   Intake/Output Summary (Last 24 hours) at 12/27/2018 1111 Last data filed at 12/27/2018 1031 Gross per 24 hour  Intake 1308.8 ml  Output 150 ml  Net 1158.8 ml   Weight change:   Physical Exam: Gen: older gentleman, sitting in bed CVS: RRR no m/r/g Resp: clear bilaterally no c/w/r Abd: soft, nontender NABS Ext: some R sided pedal edema, little L sided pedal edema.  LUE wrapped in bandage, no gross hematomas felt, good grip strength, radial pulse weakly palpable, fingers warm without nailbed cyanosis.  No pain/ numbness/ tingling.    Imaging: No results found.  Labs: BMET Recent Labs  Lab  12/26/18 1020 12/26/18 1930 12/26/18 2115 12/27/18 0134 12/27/18 0829  NA 135 133* 136 135  --   K 5.2* 6.6* 5.4* 5.1 5.0  CL  --  103 104 102  --   CO2  --  21* 21* 19*  --   GLUCOSE 112* 280* 307* 251*  --   BUN  --  60* 58* 60*  --   CREATININE  --  4.31* 4.32* 4.32*  --   CALCIUM  --  9.1 9.4 9.0  --    CBC Recent Labs  Lab 12/26/18 1020 12/26/18 1930 12/27/18 0134  WBC  --  15.8* 10.7*  NEUTROABS  --  15.1*  --   HGB 10.9* 8.1* 6.9*  HCT 32.0* 26.8* 22.1*  MCV  --  100.8* 98.2  PLT  --  334 298    Medications:    . cycloSPORINE modified  50 mg Oral BID  . insulin aspart  0-5 Units Subcutaneous QHS  . insulin aspart  0-9 Units Subcutaneous TID WC  . predniSONE  5 mg Oral Daily  . sodium bicarbonate  1,300 mg Oral BID  . sodium zirconium cyclosilicate  10 g Oral BID      Madelon Lips MD [facility] pgr [phone] 12/27/2018, 11:11 AM

## 2018-12-27 NOTE — Progress Notes (Addendum)
PROGRESS NOTE    Caleb Hicks  GNF:[id number]  DOB: 1940/07/01  DOA: 12/26/2018 PCP: [name], MD  Brief Narrative:   78 y.o. 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 He had fistula created in LUE on 7/9 with intra-op  blood loss due to bleeding collaterals during surgery according to op note.After leaving hospital to go home, initially felt okay but then on exiting the car to walk into the house he felt lightheaded and then fell down to the ground. He says he quickly regained consciousness and denies any acute complaints around surgical site except mild pain.   Subjective:  Resting comfortably, received PRBC . Reports chronic bruising on extremities for years. Denies dizziness but hasnt walked. Also reports "black stools because I am on iron" .   Objective: Vitals:   12/27/18 0957 12/27/18 1000 12/27/18 1032 12/27/18 1416  BP: (!) 134/50 (!) 134/50 (!) 125/43 (!) 145/50  Pulse: 78 77 73 69  Resp: (!) 23 20 (!) 25 17  Temp: 97.6 F (36.4 C) 97.6 F (36.4 C) 97.8 F (36.6 C) 97.8 F (36.6 C)  TempSrc: Oral Oral Oral Oral  SpO2: 98% 98% 96% 97%  Weight:      Height:        Intake/Output Summary (Last 24 hours) at 12/27/2018 1611 Last data filed at 12/27/2018 1416 Gross per 24 hour  Intake 1786.8 ml  Output 150 ml  Net 1636.8 ml   Filed Weights   12/26/18 1743 12/26/18 2216 12/27/18 0501  Weight: 71.2 kg 71 kg 70.5 kg    Physical Examination:  General exam: Appears calm and comfortable  Respiratory system: Clear to auscultation. Respiratory effort normal. Cardiovascular system: S1 & S2 heard, RRR. No JVD, murmurs, rubs, gallops or clicks. No pedal edema. Gastrointestinal system: Abdomen is nondistended, soft and nontender. No organomegaly or masses felt. Normal bowel sounds heard. Central nervous system: Alert and oriented. No focal neurological  deficits. Extremities: Symmetric 5 x 5 power. Skin: extensive bruising on upper extremities  Psychiatry: Judgement and insight appear normal. Mood & affect appropriate.         Data Reviewed: I have personally reviewed following labs and imaging studies  CBC: Recent Labs  Lab 12/26/18 1020 12/26/18 1930 12/27/18 0134  WBC  --  15.8* 10.7*  NEUTROABS  --  15.1*  --   HGB 10.9* 8.1* 6.9*  HCT 32.0* 26.8* 22.1*  MCV  --  100.8* 98.2  PLT  --  334 846   Basic Metabolic Panel: Recent Labs  Lab 12/26/18 1020 12/26/18 1930 12/26/18 2115 12/27/18 0134 12/27/18 0829  NA 135 133* 136 135  --   K 5.2* 6.6* 5.4* 5.1 5.0  CL  --  103 104 102  --   CO2  --  21* 21* 19*  --   GLUCOSE 112* 280* 307* 251*  --   BUN  --  60* 58* 60*  --   CREATININE  --  4.31* 4.32* 4.32*  --   CALCIUM  --  9.1 9.4 9.0  --    GFR: Estimated Creatinine Clearance: 12.7 mL/min (A) (by C-G formula based on SCr of 4.32 mg/dL (H)). Liver Function Tests: No results for input(s): AST, ALT, ALKPHOS, BILITOT, PROT, ALBUMIN in the last 168 hours. No results for input(s): LIPASE, AMYLASE in the last 168 hours. No results for input(s): AMMONIA in  the last 168 hours. Coagulation Profile: No results for input(s): INR, PROTIME in the last 168 hours. Cardiac Enzymes: No results for input(s): CKTOTAL, CKMB, CKMBINDEX, TROPONINI in the last 168 hours. BNP (last 3 results) No results for input(s): PROBNP in the last 8760 hours. HbA1C: No results for input(s): HGBA1C in the last 72 hours. CBG: Recent Labs  Lab 12/26/18 2052 12/26/18 2120 12/26/18 2224 12/27/18 0618 12/27/18 1155  GLUCAP 301* 293* 258* 183* 196*   Lipid Profile: No results for input(s): CHOL, HDL, LDLCALC, TRIG, CHOLHDL, LDLDIRECT in the last 72 hours. Thyroid Function Tests: No results for input(s): TSH, T4TOTAL, FREET4, T3FREE, THYROIDAB in the last 72 hours. Anemia Panel: Recent Labs    12/27/18 0134  FERRITIN 86  TIBC 188*   IRON 14*   Sepsis Labs: No results for input(s): PROCALCITON, LATICACIDVEN in the last 168 hours.  Recent Results (from the past 240 hour(s))  SARS Coronavirus 2 (Performed in [place] hospital lab)     Status: None   Collection Time: 12/23/18 10:31 AM   Specimen: Nasal Swab  Result Value Ref Range Status   SARS Coronavirus 2 NEGATIVE NEGATIVE Final    Comment: (NOTE) SARS-CoV-2 target nucleic acids are NOT DETECTED. The SARS-CoV-2 RNA is generally detectable in upper and lower respiratory specimens during the acute phase of infection. Negative results do not preclude SARS-CoV-2 infection, do not rule out co-infections with other pathogens, and should not be used as the sole basis for treatment or other patient management decisions. Negative results must be combined with clinical observations, patient history, and epidemiological information. The expected result is Negative. Fact Sheet for Patients: SugarRoll.be Fact Sheet for Healthcare Providers: https://www.woods-mathews.com/ This test is not yet approved or cleared by the [place] FDA and  has been authorized for detection and/or diagnosis of SARS-CoV-2 by FDA under an Emergency Use Authorization (EUA). This EUA will remain  in effect (meaning this test can be used) for the duration of the COVID-19 declaration under Section 56 4(b)(1) of the Act, 21 U.S.C. section 360bbb-3(b)(1), unless the authorization is terminated or revoked sooner. Performed at [facility], [place] [address] [place], [place] [address]   SARS Coronavirus 2 (CEPHEID - Performed in [place] hospital lab), Hosp Order     Status: None   Collection Time: 12/26/18  8:34 PM   Specimen: Nasopharyngeal Swab  Result Value Ref Range Status   SARS Coronavirus 2 NEGATIVE NEGATIVE Final    Comment: (NOTE) If result is NEGATIVE SARS-CoV-2 target nucleic acids are NOT DETECTED. The SARS-CoV-2 RNA is  generally detectable in upper and lower  respiratory specimens during the acute phase of infection. The lowest  concentration of SARS-CoV-2 viral copies this assay can detect is 250  copies / mL. A negative result does not preclude SARS-CoV-2 infection  and should not be used as the sole basis for treatment or other  patient management decisions.  A negative result may occur with  improper specimen collection / handling, submission of specimen other  than nasopharyngeal swab, presence of viral mutation(s) within the  areas targeted by this assay, and inadequate number of viral copies  (<250 copies / mL). A negative result must be combined with clinical  observations, patient history, and epidemiological information. If result is POSITIVE SARS-CoV-2 target nucleic acids are DETECTED. The SARS-CoV-2 RNA is generally detectable in upper and lower  respiratory specimens dur ing the acute phase of infection.  Positive  results are indicative of active infection with  SARS-CoV-2.  Clinical  correlation with patient history and other diagnostic information is  necessary to determine patient infection status.  Positive results do  not rule out bacterial infection or co-infection with other viruses. If result is PRESUMPTIVE POSTIVE SARS-CoV-2 nucleic acids MAY BE PRESENT.   A presumptive positive result was obtained on the submitted specimen  and confirmed on repeat testing.  While 2019 novel coronavirus  (SARS-CoV-2) nucleic acids may be present in the submitted sample  additional confirmatory testing may be necessary for epidemiological  and / or clinical management purposes  to differentiate between  SARS-CoV-2 and other Sarbecovirus currently known to infect humans.  If clinically indicated additional testing with an alternate test  methodology [phone]) is advised. The SARS-CoV-2 RNA is generally  detectable in upper and lower respiratory sp ecimens during the acute  phase of infection.  The expected result is Negative. Fact Sheet for Patients:  StrictlyIdeas.no Fact Sheet for Healthcare Providers: BankingDealers.co.za This test is not yet approved or cleared by the [place] FDA and has been authorized for detection and/or diagnosis of SARS-CoV-2 by FDA under an Emergency Use Authorization (EUA).  This EUA will remain in effect (meaning this test can be used) for the duration of the COVID-19 declaration under Section 564(b)(1) of the Act, 21 U.S.C. section 360bbb-3(b)(1), unless the authorization is terminated or revoked sooner. Performed at [facility], [place] [address], [place], [place] [address]       Radiology Studies: No results found.      Scheduled Meds: . cycloSPORINE modified  50 mg Oral BID  . insulin aspart  0-5 Units Subcutaneous QHS  . insulin aspart  0-9 Units Subcutaneous TID WC  . predniSONE  5 mg Oral Daily  . sodium bicarbonate  1,300 mg Oral BID  . sodium zirconium cyclosilicate  10 g Oral BID   Continuous Infusions:  Assessment & Plan:    1. Syncope: secondary to hyper kalemia related cardiac arrythmia vs acute blood loss anemia (Hgb at 8 yesterday). Other differentials include vagal or orthostasis related symptoms. Feels better now. Check orthostatics  2. Hyperkalemia: Improved with medical management , did not need dialysis. ?Related to chronic prednisone /ACE1 intake. ACEI on hold. Additional  Lokelma ordered as concerned for recurrence with transfusion.  3. Severe anemia: Hgb further dropped to 6.9 this morning and recieving PRBC. Additional Lokelma dosing ordered by renal. Appreciate Renal d/w Dr Donzetta Matters , no concerns for acute bleeding at surgical site, likley delayed response to ABL yesterday plus extensive brusing/ subcutaneous bleeding at surgical site. Patient reports dark stools and relates to fe pills, check OCB also  4. IgA nephropathy, Post renal transplant, now Stage V  CKD: Not on HD . Creat stable at 4.3. Resumed home meds including prednisone  5. HTN: Meds held on admission due to #1 & #3. Resume metoprolol. Termisartan on hold due to #2 . Check orthostatics  6. DM: Januvia on hold. SSI/pre meal insulin   DVT prophylaxis: SCDs Code Status: DNR Family / Patient Communication: d/w patient Disposition Plan: home in am likely     LOS: 1 day    Time spent: 73 minutes    Guilford Shi, MD [facility] Pager [phone]  If 7PM-7AM, please contact night-coverage [email or web address] Password [email or web address] 12/27/2018, 4:11 PM

## 2018-12-28 LAB — BASIC METABOLIC PANEL
Anion gap: 10 (ref 5–15)
BUN: 64 mg/dL — ABNORMAL HIGH (ref 8–23)
CO2: 24 mmol/L (ref 22–32)
Calcium: 9 mg/dL (ref 8.9–10.3)
Chloride: 101 mmol/L (ref 98–111)
Creatinine, Ser: 4.33 mg/dL — ABNORMAL HIGH (ref 0.61–1.24)
GFR calc Af Amer: 14 mL/min — ABNORMAL LOW (ref >60)
GFR calc non Af Amer: 12 mL/min — ABNORMAL LOW (ref >60)
Glucose, Bld: 151 mg/dL — ABNORMAL HIGH (ref 70–99)
Potassium: 4.3 mmol/L (ref 3.5–5.1)
Sodium: 135 mmol/L (ref 135–145)

## 2018-12-28 LAB — HEMOGLOBIN AND HEMATOCRIT, BLOOD
HCT: 25.2 % — ABNORMAL LOW (ref 39.0–52.0)
Hemoglobin: 8.2 g/dL — ABNORMAL LOW (ref 13.0–17.0)

## 2018-12-28 LAB — CBC
HCT: 26 % — ABNORMAL LOW (ref 39.0–52.0)
Hemoglobin: 8.8 g/dL — ABNORMAL LOW (ref 13.0–17.0)
MCH: 31.7 pg (ref 26.0–34.0)
MCHC: 33.8 g/dL (ref 30.0–36.0)
MCV: 93.5 fL (ref 80.0–100.0)
Platelets: 259 10*3/uL (ref 150–400)
RBC: 2.78 MIL/uL — ABNORMAL LOW (ref 4.22–5.81)
RDW: 15.1 % (ref 11.5–15.5)
WBC: 10.3 10*3/uL (ref 4.0–10.5)
nRBC: 0 % (ref 0.0–0.2)

## 2018-12-28 LAB — GLUCOSE, CAPILLARY
Glucose-Capillary: 140 mg/dL — ABNORMAL HIGH (ref 70–99)
Glucose-Capillary: 143 mg/dL — ABNORMAL HIGH (ref 70–99)
Glucose-Capillary: 147 mg/dL — ABNORMAL HIGH (ref 70–99)
Glucose-Capillary: 198 mg/dL — ABNORMAL HIGH (ref 70–99)

## 2018-12-28 LAB — OCCULT BLOOD X 1 CARD TO LAB, STOOL: Fecal Occult Bld: NEGATIVE

## 2018-12-28 LAB — HEMOGLOBIN A1C
Hgb A1c MFr Bld: 6.1 % — ABNORMAL HIGH (ref 4.8–5.6)
Mean Plasma Glucose: 128 mg/dL

## 2018-12-28 MED ORDER — LACTULOSE 10 GM/15ML PO SOLN
30.0000 g | Freq: Once | ORAL | Status: AC
  Administered 2018-12-28: 13:00:00 30 g via ORAL
  Filled 2018-12-28: qty 45

## 2018-12-28 NOTE — Progress Notes (Signed)
[facility] Progress Note    Assessment/ Plan:    # Hyperkalemia: s/p calcium gluconate, 1 amp of bicarbonate, 5 units of insulin and an amp of D50 and a dose of Lokelma, now down to 5 this AM.  Off ARB.  K better at 4.3 this AM.  Stop Lokelma and fluids.  # Acute blood loss anemia: EBL in OR 250 mL per note, Hgb as 10.9 on preop Hemoccue (could be falsely high), down to 6.9 .  Now up to 8.8 s/p 2 u pRBCs.  Got Aranesp x 1. d/w Dr. Donzetta Matters, anemia not unexpected given operative findings, no imaging necessary at this time.  # s/p interpositional graft 7/9: sig L forearm swelling from compressive bandage (has been on since surg 7/9), will remove and place dry gauze dressing to keep incision clean and dry.   # CKD stage V in renal transplant recipient: At baseline creatinine of 4.32; now that K successfully rx'd, no acute indications for HD at this time.  On pred 5 and cyclosporine 50 BID  # Syncope: possibly d/t ABLA, per primary.  Orthostatics positive yesterday, would continue to hold ARB and would resume Lasix 40 mg daily on d/c  # Metabolic acidosis: oral Na bicarb 1300 BID  # Secondary HPT: holding calcitriol  #.  Dispo: from renal perspective could go home if Hgb remains stable and pt can ambulate without dizziness  Subjective:    Hgb 8.8 this AM.  Sleeping this AM.  BP did drop with standing yesterday.  (111/70)   Objective:   BP 138/65 (BP Location: Right Wrist)   Pulse 81   Temp 97.7 F (36.5 C) (Oral)   Resp 14   Ht 5\' 6"  (1.676 m)   Wt 70.5 kg   SpO2 99%   BMI 25.09 kg/m   Intake/Output Summary (Last 24 hours) at 12/28/2018 1036 Last data filed at 12/28/2018 0914 Gross per 24 hour  Intake 598 ml  Output 475 ml  Net 123 ml   Weight change: -0.715 kg  Physical Exam: Gen: older gentleman, sitting in bed CVS: RRR no m/r/g Resp: clear bilaterally no c/w/r Abd: soft, nontender NABS Ext: some R sided pedal edema, little L sided pedal edema.  LUE  wrapped in bandage, examined. Incision c/d/i, soft, no gross hematomas felt, good grip strength, radial pulse weakly palpable, fingers warm without nailbed cyanosis.  No pain/ numbness/ tingling.  L forearm swelling today.    Imaging: No results found.  Labs: BMET Recent Labs  Lab 12/26/18 1020 12/26/18 1930 12/26/18 2115 12/27/18 0134 12/27/18 0829 12/27/18 1624 12/28/18 0324  NA 135 133* 136 135  --  137 135  K 5.2* 6.6* 5.4* 5.1 5.0 5.1 4.3  CL  --  103 104 102  --  102 101  CO2  --  21* 21* 19*  --  22 24  GLUCOSE 112* 280* 307* 251*  --  180* 151*  BUN  --  60* 58* 60*  --  62* 64*  CREATININE  --  4.31* 4.32* 4.32*  --  4.42* 4.33*  CALCIUM  --  9.1 9.4 9.0  --  8.9 9.0  PHOS  --   --   --   --   --  4.0  --    CBC Recent Labs  Lab 12/26/18 1930 12/27/18 0134 12/27/18 1624 12/28/18 0324  WBC 15.8* 10.7*  --  10.3  NEUTROABS 15.1*  --   --   --  HGB 8.1* 6.9* 9.2* 8.8*  HCT 26.8* 22.1* 27.5* 26.0*  MCV 100.8* 98.2  --  93.5  PLT 334 298  --  259    Medications:    . cycloSPORINE modified  50 mg Oral BID  . insulin aspart  0-5 Units Subcutaneous QHS  . insulin aspart  0-9 Units Subcutaneous TID WC  . predniSONE  5 mg Oral Daily  . sodium bicarbonate  1,300 mg Oral BID  . sodium zirconium cyclosilicate  10 g Oral BID      Madelon Lips MD [facility] pgr [phone] 12/28/2018, 10:36 AM

## 2018-12-28 NOTE — Progress Notes (Signed)
PROGRESS NOTE    Caleb Hicks  NGE:[id number]  DOB: 01-24-1941  DOA: 12/26/2018 PCP: [name], MD  Brief Narrative:   78 y.o. 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 He had fistula created in LUE on 7/9 with intra-op  blood loss due to bleeding collaterals during surgery according to op note.After leaving hospital to go home, initially felt okay but then on exiting the car to walk into the house he felt lightheaded and then fell down to the ground. He says he quickly regained consciousness and denies any acute complaints around surgical site except mild pain.   Subjective:   Per nursing orthostatic BP check revealed , Supine : 134/57, sitting : 138/60, standing 111/70.  Denies dizziness and walked few feet yesterday. Also reported "black stools" but no BM today. Relates to Iron intake.Seen by renal  Objective: Vitals:   12/27/18 2353 12/28/18 0312 12/28/18 0403 12/28/18 0759  BP: (!) 136/55  138/70 138/65  Pulse: 78 62 76 81  Resp: 14 19 18 14   Temp: 98 F (36.7 C)  (!) 97.5 F (36.4 C) 97.7 F (36.5 C)  TempSrc: Oral  Oral Oral  SpO2: 95% 100% 100% 99%  Weight:  70.5 kg    Height:        Intake/Output Summary (Last 24 hours) at 12/28/2018 1446 Last data filed at 12/28/2018 0914 Gross per 24 hour  Intake 120 ml  Output 475 ml  Net -355 ml   Filed Weights   12/26/18 2216 12/27/18 0501 12/28/18 0312  Weight: 71 kg 70.5 kg 70.5 kg    Physical Examination:  General exam: Appears calm and comfortable  Respiratory system: Clear to auscultation. Respiratory effort normal. Cardiovascular system: S1 & S2 heard, RRR. No JVD, murmurs, rubs, gallops or clicks. No pedal edema. Gastrointestinal system: Abdomen is nondistended, soft and nontender. No organomegaly or masses felt. Normal bowel sounds heard. Central nervous system: Alert and oriented. No focal  neurological deficits. Extremities: Symmetric 5 x 5 power. Skin: extensive bruising on upper extremities , dry dressing with kerlix at AV site Psychiatry: Judgement and insight appear normal. Mood & affect appropriate.         Data Reviewed: I have personally reviewed following labs and imaging studies  CBC: Recent Labs  Lab 12/26/18 1930 12/27/18 0134 12/27/18 1624 12/28/18 0324 12/28/18 1307  WBC 15.8* 10.7*  --  10.3  --   NEUTROABS 15.1*  --   --   --   --   HGB 8.1* 6.9* 9.2* 8.8* 8.2*  HCT 26.8* 22.1* 27.5* 26.0* 25.2*  MCV 100.8* 98.2  --  93.5  --   PLT 334 298  --  259  --    Basic Metabolic Panel: Recent Labs  Lab 12/26/18 1930 12/26/18 2115 12/27/18 0134 12/27/18 0829 12/27/18 1624 12/28/18 0324  NA 133* 136 135  --  137 135  K 6.6* 5.4* 5.1 5.0 5.1 4.3  CL 103 104 102  --  102 101  CO2 21* 21* 19*  --  22 24  GLUCOSE 280* 307* 251*  --  180* 151*  BUN 60* 58* 60*  --  62* 64*  CREATININE 4.31* 4.32* 4.32*  --  4.42* 4.33*  CALCIUM 9.1 9.4 9.0  --  8.9 9.0  PHOS  --   --   --   --  4.0  --  GFR: Estimated Creatinine Clearance: 12.7 mL/min (A) (by C-G formula based on SCr of 4.33 mg/dL (H)). Liver Function Tests: Recent Labs  Lab 12/27/18 1624  ALBUMIN 2.7*   No results for input(s): LIPASE, AMYLASE in the last 168 hours. No results for input(s): AMMONIA in the last 168 hours. Coagulation Profile: No results for input(s): INR, PROTIME in the last 168 hours. Cardiac Enzymes: No results for input(s): CKTOTAL, CKMB, CKMBINDEX, TROPONINI in the last 168 hours. BNP (last 3 results) No results for input(s): PROBNP in the last 8760 hours. HbA1C: Recent Labs    12/26/18 2112  HGBA1C 6.1*   CBG: Recent Labs  Lab 12/27/18 0618 12/27/18 1155 12/27/18 2212 12/28/18 0604 12/28/18 1100  GLUCAP 183* 196* 178* 140* 143*   Lipid Profile: No results for input(s): CHOL, HDL, LDLCALC, TRIG, CHOLHDL, LDLDIRECT in the last 72 hours. Thyroid  Function Tests: No results for input(s): TSH, T4TOTAL, FREET4, T3FREE, THYROIDAB in the last 72 hours. Anemia Panel: Recent Labs    12/27/18 0134  FERRITIN 86  TIBC 188*  IRON 14*   Sepsis Labs: No results for input(s): PROCALCITON, LATICACIDVEN in the last 168 hours.  Recent Results (from the past 240 hour(s))  SARS Coronavirus 2 (Performed in [place] hospital lab)     Status: None   Collection Time: 12/23/18 10:31 AM   Specimen: Nasal Swab  Result Value Ref Range Status   SARS Coronavirus 2 NEGATIVE NEGATIVE Final    Comment: (NOTE) SARS-CoV-2 target nucleic acids are NOT DETECTED. The SARS-CoV-2 RNA is generally detectable in upper and lower respiratory specimens during the acute phase of infection. Negative results do not preclude SARS-CoV-2 infection, do not rule out co-infections with other pathogens, and should not be used as the sole basis for treatment or other patient management decisions. Negative results must be combined with clinical observations, patient history, and epidemiological information. The expected result is Negative. Fact Sheet for Patients: SugarRoll.be Fact Sheet for Healthcare Providers: https://www.woods-mathews.com/ This test is not yet approved or cleared by the [place] FDA and  has been authorized for detection and/or diagnosis of SARS-CoV-2 by FDA under an Emergency Use Authorization (EUA). This EUA will remain  in effect (meaning this test can be used) for the duration of the COVID-19 declaration under Section 56 4(b)(1) of the Act, 21 U.S.C. section 360bbb-3(b)(1), unless the authorization is terminated or revoked sooner. Performed at [facility], [place] [address] [place], [place] [address]   SARS Coronavirus 2 (CEPHEID - Performed in [place] hospital lab), Hosp Order     Status: None   Collection Time: 12/26/18  8:34 PM   Specimen: Nasopharyngeal Swab  Result Value Ref Range  Status   SARS Coronavirus 2 NEGATIVE NEGATIVE Final    Comment: (NOTE) If result is NEGATIVE SARS-CoV-2 target nucleic acids are NOT DETECTED. The SARS-CoV-2 RNA is generally detectable in upper and lower  respiratory specimens during the acute phase of infection. The lowest  concentration of SARS-CoV-2 viral copies this assay can detect is 250  copies / mL. A negative result does not preclude SARS-CoV-2 infection  and should not be used as the sole basis for treatment or other  patient management decisions.  A negative result may occur with  improper specimen collection / handling, submission of specimen other  than nasopharyngeal swab, presence of viral mutation(s) within the  areas targeted by this assay, and inadequate number of viral copies  (<250 copies / mL). A negative result must be  combined with clinical  observations, patient history, and epidemiological information. If result is POSITIVE SARS-CoV-2 target nucleic acids are DETECTED. The SARS-CoV-2 RNA is generally detectable in upper and lower  respiratory specimens dur ing the acute phase of infection.  Positive  results are indicative of active infection with SARS-CoV-2.  Clinical  correlation with patient history and other diagnostic information is  necessary to determine patient infection status.  Positive results do  not rule out bacterial infection or co-infection with other viruses. If result is PRESUMPTIVE POSTIVE SARS-CoV-2 nucleic acids MAY BE PRESENT.   A presumptive positive result was obtained on the submitted specimen  and confirmed on repeat testing.  While 2019 novel coronavirus  (SARS-CoV-2) nucleic acids may be present in the submitted sample  additional confirmatory testing may be necessary for epidemiological  and / or clinical management purposes  to differentiate between  SARS-CoV-2 and other Sarbecovirus currently known to infect humans.  If clinically indicated additional testing with an alternate  test  methodology [id number]) is advised. The SARS-CoV-2 RNA is generally  detectable in upper and lower respiratory sp ecimens during the acute  phase of infection. The expected result is Negative. Fact Sheet for Patients:  StrictlyIdeas.no Fact Sheet for Healthcare Providers: BankingDealers.co.za This test is not yet approved or cleared by the [place] FDA and has been authorized for detection and/or diagnosis of SARS-CoV-2 by FDA under an Emergency Use Authorization (EUA).  This EUA will remain in effect (meaning this test can be used) for the duration of the COVID-19 declaration under Section 564(b)(1) of the Act, 21 U.S.C. section 360bbb-3(b)(1), unless the authorization is terminated or revoked sooner. Performed at [facility], [place] [address], [place], [place] [address]       Radiology Studies: No results found.      Scheduled Meds: . cycloSPORINE modified  50 mg Oral BID  . insulin aspart  0-5 Units Subcutaneous QHS  . insulin aspart  0-9 Units Subcutaneous TID WC  . predniSONE  5 mg Oral Daily  . sodium bicarbonate  1,300 mg Oral BID   Continuous Infusions:  Assessment & Plan:    1. Syncope: secondary to hyper kalemia related cardiac arrythmia vs acute blood loss anemia (Hgb at 8 yesterday). Other differentials include vagal or orthostasis related symptoms. Feels better now. Some orthostatic change noted but not very symptomatic on walking now. Support stockings advised.  2. Hyperkalemia: Improved with medical management , did not need dialysis. ?Related to chronic prednisone /ACE1 intake. ACEI on hold. Recieved Lokelma during hospital course through renal and now off.   3. Severe Fe deficiency anemia: Hgb dropped to 6.9 yesterday morning and received 2 units PRBC. Hemoglobin post transfusion improved to 9.2 but been downtrending again.  Appreciate Renal d/w Dr Donzetta Hicks , no concerns for acute bleeding at  surgical site and felt to be delayed response to operative ABL plus extensive brusing/ subcutaneous bleeding at surgical site. Patient reports dark stools and relates to fe pills, but will check OCB given down trending Hgb. Will check Hgb in am , if stable, can be discharged. If not may need GI eval.   4. IgA nephropathy, Post renal transplant, now Stage V CKD: Not on HD . Creat stable at 4.3. Resumed home meds including prednisone  5. HTN: Meds held on admission due to #1 & #3. Resume metoprolol. Termisartan on hold due to #2 . Check orthostatics  6. DM: Januvia on hold. SSI/pre meal insulin  7. Constipation: Hasnt had  BM in 2 days. Laxative prn   DVT prophylaxis: SCDs Code Status: DNR Family / Patient Communication: d/w patient Disposition Plan: home in am likely     LOS: 2 days    Time spent: 71 minutes    Guilford Shi, MD [facility] Pager [phone]  If 7PM-7AM, please contact night-coverage [email or web address] Password [facility] 12/28/2018, 2:46 PM

## 2018-12-29 ENCOUNTER — Inpatient Hospital Stay (HOSPITAL_COMMUNITY): Payer: Medicare Other

## 2018-12-29 LAB — HEMOGLOBIN AND HEMATOCRIT, BLOOD
HCT: 23.7 % — ABNORMAL LOW (ref 39.0–52.0)
Hemoglobin: 7.7 g/dL — ABNORMAL LOW (ref 13.0–17.0)

## 2018-12-29 LAB — RENAL FUNCTION PANEL
Albumin: 2.4 g/dL — ABNORMAL LOW (ref 3.5–5.0)
Anion gap: 10 (ref 5–15)
BUN: 59 mg/dL — ABNORMAL HIGH (ref 8–23)
CO2: 25 mmol/L (ref 22–32)
Calcium: 8.7 mg/dL — ABNORMAL LOW (ref 8.9–10.3)
Chloride: 105 mmol/L (ref 98–111)
Creatinine, Ser: 3.48 mg/dL — ABNORMAL HIGH (ref 0.61–1.24)
GFR calc Af Amer: 18 mL/min — ABNORMAL LOW (ref >60)
GFR calc non Af Amer: 16 mL/min — ABNORMAL LOW (ref >60)
Glucose, Bld: 136 mg/dL — ABNORMAL HIGH (ref 70–99)
Phosphorus: 3.3 mg/dL (ref 2.5–4.6)
Potassium: 3.8 mmol/L (ref 3.5–5.1)
Sodium: 140 mmol/L (ref 135–145)

## 2018-12-29 LAB — TYPE AND SCREEN
ABO/RH(D): O NEG
Antibody Screen: NEGATIVE
Unit division: 0
Unit division: 0
Unit division: 0

## 2018-12-29 LAB — BPAM RBC
Blood Product Expiration Date: 202007142359
Blood Product Expiration Date: 202007142359
Blood Product Expiration Date: 202007142359
ISSUE DATE / TIME: 202007072006
ISSUE DATE / TIME: 202007100553
ISSUE DATE / TIME: 202007101011
Unit Type and Rh: 9500
Unit Type and Rh: 9500
Unit Type and Rh: 9500

## 2018-12-29 LAB — GLUCOSE, CAPILLARY
Glucose-Capillary: 129 mg/dL — ABNORMAL HIGH (ref 70–99)
Glucose-Capillary: 127 mg/dL — ABNORMAL HIGH (ref 70–99)
Glucose-Capillary: 196 mg/dL — ABNORMAL HIGH (ref 70–99)

## 2018-12-29 MED ORDER — FUROSEMIDE 10 MG/ML IJ SOLN
20.0000 mg | Freq: Once | INTRAMUSCULAR | Status: AC
  Administered 2018-12-29: 14:00:00 20 mg via INTRAVENOUS
  Filled 2018-12-29: qty 2

## 2018-12-29 MED ORDER — LINAGLIPTIN 5 MG PO TABS
5.0000 mg | ORAL_TABLET | Freq: Every day | ORAL | Status: DC
  Administered 2018-12-29 – 2018-12-30 (×2): 5 mg via ORAL
  Filled 2018-12-29 (×2): qty 1

## 2018-12-29 MED ORDER — SODIUM CHLORIDE 0.9 % IV SOLN
510.0000 mg | Freq: Once | INTRAVENOUS | Status: AC
  Administered 2018-12-29: 510 mg via INTRAVENOUS
  Filled 2018-12-29: qty 17

## 2018-12-29 NOTE — Progress Notes (Signed)
PROGRESS NOTE    Caleb Hicks  TFT:[id number]  DOB: 1940-08-21  DOA: 12/26/2018 PCP: [name], MD  Brief Narrative:   78 y.o. 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 He had fistula created in LUE on 7/9 with intra-op  blood loss due to bleeding collaterals during surgery according to op note.After leaving hospital to go home, initially felt okay but then on exiting the car to walk into the house he felt lightheaded and then fell down to the ground. He says he quickly regained consciousness and denies any acute complaints around surgical site except mild pain.   Subjective:  Patient resting comfortably. Noted to have worsening swelling in left forearm/hand. Wrist band/AV site dressing feels tight per patient/nurse. Stool OCB -ve. CBC shows further drop in Hgb today  Objective: Vitals:   12/28/18 1702 12/28/18 1955 12/29/18 0023 12/29/18 0605  BP: (!) 146/67 (!) 155/64 137/60 (!) 147/67  Pulse: 96 89 87 91  Resp: 17  (!) 21 12  Temp: 97.6 F (36.4 C) 98.7 F (37.1 C) 98.3 F (36.8 C) 98.1 F (36.7 C)  TempSrc: Oral Oral Oral Oral  SpO2: 100% 100% 100% 100%  Weight:    70.8 kg  Height:        Intake/Output Summary (Last 24 hours) at 12/29/2018 1307 Last data filed at 12/29/2018 7062 Gross per 24 hour  Intake 0 ml  Output --  Net 0 ml   Filed Weights   12/27/18 0501 12/28/18 0312 12/29/18 0605  Weight: 70.5 kg 70.5 kg 70.8 kg    Physical Examination:  General exam: Appears calm and comfortable  Respiratory system: Clear to auscultation. Respiratory effort normal. Cardiovascular system: S1 & S2 heard, RRR. No JVD, murmurs, rubs, gallops or clicks. Mild chronic leg edema. Gastrointestinal system: Abdomen is nondistended, soft and nontender. No organomegaly or masses felt. Normal bowel sounds heard. Central nervous system: Alert and oriented. No focal  neurological deficits. Extremities: worsening swelling along left elbow Hollace Hayward /hand .  dry dressing with kerlix at AV site Skin: extensive bruising on upper extremities , Psychiatry: Judgement and insight appear normal. Mood & affect appropriate.     Data Reviewed: I have personally reviewed following labs and imaging studies  CBC: Recent Labs  Lab 12/26/18 1930 12/27/18 0134 12/27/18 1624 12/28/18 0324 12/28/18 1307 12/29/18 0509  WBC 15.8* 10.7*  --  10.3  --   --   NEUTROABS 15.1*  --   --   --   --   --   HGB 8.1* 6.9* 9.2* 8.8* 8.2* 7.7*  HCT 26.8* 22.1* 27.5* 26.0* 25.2* 23.7*  MCV 100.8* 98.2  --  93.5  --   --   PLT 334 298  --  259  --   --    Basic Metabolic Panel: Recent Labs  Lab 12/26/18 2115 12/27/18 0134 12/27/18 0829 12/27/18 1624 12/28/18 0324 12/29/18 1154  NA 136 135  --  137 135 140  K 5.4* 5.1 5.0 5.1 4.3 3.8  CL 104 102  --  102 101 105  CO2 21* 19*  --  22 24 25   GLUCOSE 307* 251*  --  180* 151* 136*  BUN 58* 60*  --  62* 64* 59*  CREATININE 4.32* 4.32*  --  4.42* 4.33* 3.48*  CALCIUM 9.4 9.0  --  8.9 9.0 8.7*  PHOS  --   --   --  4.0  --  3.3   GFR: Estimated Creatinine Clearance: 15.8 mL/min (A) (by C-G formula based on SCr of 3.48 mg/dL (H)). Liver Function Tests: Recent Labs  Lab 12/27/18 1624 12/29/18 1154  ALBUMIN 2.7* 2.4*   No results for input(s): LIPASE, AMYLASE in the last 168 hours. No results for input(s): AMMONIA in the last 168 hours. Coagulation Profile: No results for input(s): INR, PROTIME in the last 168 hours. Cardiac Enzymes: No results for input(s): CKTOTAL, CKMB, CKMBINDEX, TROPONINI in the last 168 hours. BNP (last 3 results) No results for input(s): PROBNP in the last 8760 hours. HbA1C: Recent Labs    12/26/18 2112  HGBA1C 6.1*   CBG: Recent Labs  Lab 12/28/18 1100 12/28/18 1552 12/28/18 2158 12/29/18 0631 12/29/18 1104  GLUCAP 143* 147* 198* 129* 127*   Lipid Profile: No results for  input(s): CHOL, HDL, LDLCALC, TRIG, CHOLHDL, LDLDIRECT in the last 72 hours. Thyroid Function Tests: No results for input(s): TSH, T4TOTAL, FREET4, T3FREE, THYROIDAB in the last 72 hours. Anemia Panel: Recent Labs    12/27/18 0134  FERRITIN 86  TIBC 188*  IRON 14*   Sepsis Labs: No results for input(s): PROCALCITON, LATICACIDVEN in the last 168 hours.  Recent Results (from the past 240 hour(s))  SARS Coronavirus 2 (Performed in [place] hospital lab)     Status: None   Collection Time: 12/23/18 10:31 AM   Specimen: Nasal Swab  Result Value Ref Range Status   SARS Coronavirus 2 NEGATIVE NEGATIVE Final    Comment: (NOTE) SARS-CoV-2 target nucleic acids are NOT DETECTED. The SARS-CoV-2 RNA is generally detectable in upper and lower respiratory specimens during the acute phase of infection. Negative results do not preclude SARS-CoV-2 infection, do not rule out co-infections with other pathogens, and should not be used as the sole basis for treatment or other patient management decisions. Negative results must be combined with clinical observations, patient history, and epidemiological information. The expected result is Negative. Fact Sheet for Patients: SugarRoll.be Fact Sheet for Healthcare Providers: https://www.woods-mathews.com/ This test is not yet approved or cleared by the [place] FDA and  has been authorized for detection and/or diagnosis of SARS-CoV-2 by FDA under an Emergency Use Authorization (EUA). This EUA will remain  in effect (meaning this test can be used) for the duration of the COVID-19 declaration under Section 56 4(b)(1) of the Act, 21 U.S.C. section 360bbb-3(b)(1), unless the authorization is terminated or revoked sooner. Performed at [facility], [place] [address] [place], [place] [address]   SARS Coronavirus 2 (CEPHEID - Performed in [place] hospital lab), Hosp Order     Status: None    Collection Time: 12/26/18  8:34 PM   Specimen: Nasopharyngeal Swab  Result Value Ref Range Status   SARS Coronavirus 2 NEGATIVE NEGATIVE Final    Comment: (NOTE) If result is NEGATIVE SARS-CoV-2 target nucleic acids are NOT DETECTED. The SARS-CoV-2 RNA is generally detectable in upper and lower  respiratory specimens during the acute phase of infection. The lowest  concentration of SARS-CoV-2 viral copies this assay can detect is 250  copies / mL. A negative result does not preclude SARS-CoV-2 infection  and should not be used as the sole basis for treatment or other  patient management decisions.  A negative result may occur with  improper specimen collection / handling, submission of specimen other  than nasopharyngeal swab, presence of viral mutation(s) within the  areas targeted by this assay, and inadequate number of viral copies  (<  250 copies / mL). A negative result must be combined with clinical  observations, patient history, and epidemiological information. If result is POSITIVE SARS-CoV-2 target nucleic acids are DETECTED. The SARS-CoV-2 RNA is generally detectable in upper and lower  respiratory specimens dur ing the acute phase of infection.  Positive  results are indicative of active infection with SARS-CoV-2.  Clinical  correlation with patient history and other diagnostic information is  necessary to determine patient infection status.  Positive results do  not rule out bacterial infection or co-infection with other viruses. If result is PRESUMPTIVE POSTIVE SARS-CoV-2 nucleic acids MAY BE PRESENT.   A presumptive positive result was obtained on the submitted specimen  and confirmed on repeat testing.  While 2019 novel coronavirus  (SARS-CoV-2) nucleic acids may be present in the submitted sample  additional confirmatory testing may be necessary for epidemiological  and / or clinical management purposes  to differentiate between  SARS-CoV-2 and other Sarbecovirus  currently known to infect humans.  If clinically indicated additional testing with an alternate test  methodology [phone]) is advised. The SARS-CoV-2 RNA is generally  detectable in upper and lower respiratory sp ecimens during the acute  phase of infection. The expected result is Negative. Fact Sheet for Patients:  StrictlyIdeas.no Fact Sheet for Healthcare Providers: BankingDealers.co.za This test is not yet approved or cleared by the [place] FDA and has been authorized for detection and/or diagnosis of SARS-CoV-2 by FDA under an Emergency Use Authorization (EUA).  This EUA will remain in effect (meaning this test can be used) for the duration of the COVID-19 declaration under Section 564(b)(1) of the Act, 21 U.S.C. section 360bbb-3(b)(1), unless the authorization is terminated or revoked sooner. Performed at [facility], [place] [address] [place], [place] [address]       Radiology Studies: Ct Abdomen Pelvis Wo Contrast  Result Date: 12/29/2018 CLINICAL DATA:  Unexplained anemia. Suspected retroperitoneal hemorrhage. EXAM: CT ABDOMEN AND PELVIS WITHOUT CONTRAST TECHNIQUE: Multidetector CT imaging of the abdomen and pelvis was performed following the standard protocol without IV contrast. COMPARISON:  11/09/2017 FINDINGS: Lower chest: No acute findings. Hepatobiliary: No mass visualized on this unenhanced exam. Tiny calcified gallstones or sludge are seen, however there is no evidence of cholecystitis or biliary ductal dilatation. Pancreas: No mass or inflammatory process visualized on this unenhanced exam. Spleen:  Within normal limits in size. Adrenals/Urinary tract: Diffuse atrophy of native kidneys and multiple cysts are again seen, including small hyperdense hemorrhagic cyst in the lower pole the right kidney. No evidence of urolithiasis or hydronephrosis. Renal transplant is seen in the right iliac fossa which is unremarkable  in appearance on this unenhanced exam. No evidence of hydronephrosis. Unremarkable unopacified urinary bladder. Stomach/Bowel: Severe diverticulosis is seen mainly involving the sigmoid colon, however there is no evidence of diverticulitis. No evidence of obstruction, inflammatory process, or abnormal fluid collections. Vascular/Lymphatic: No evidence of retroperitoneal hemorrhage. No pathologically enlarged lymph nodes identified. 3.6 cm infrarenal abdominal aortic aneurysm is mildly enlarged from 3.3 cm previously. Reproductive:  No mass or other significant abnormality. Other:  None. Musculoskeletal: No suspicious bone lesions identified. Advanced spondylosis is again seen at L4-5 with grade 2 anterolisthesis. IMPRESSION: No evidence of retroperitoneal hemorrhage or other acute findings. 3.6 cm infrarenal abdominal aortic aneurysm, mildly increased in size from prior exam. Recommend followup by ultrasound in 2 years. This recommendation follows ACR consensus guidelines: White Paper of the ACR Incidental Findings Committee II on Vascular Findings. J Am Coll Radiol 2013; 10:789-794.  Aortic aneurysm NOS (ICD10-I71.9) Normal appearance of renal transplant. No evidence of transplant hydronephrosis. Cholelithiasis.  No radiographic evidence of cholecystitis. Colonic diverticulosis, without radiographic evidence of diverticulitis. Electronically Signed   By: Marlaine Hind M.D.   On: 12/29/2018 10:32        Scheduled Meds:  cycloSPORINE modified  50 mg Oral BID   insulin aspart  0-5 Units Subcutaneous QHS   insulin aspart  0-9 Units Subcutaneous TID WC   predniSONE  5 mg Oral Daily   sodium bicarbonate  1,300 mg Oral BID   Continuous Infusions:  Assessment & Plan:    1. Syncope: secondary to hyper kalemia related cardiac arrythmia vs acute blood loss anemia (Hgb at 8 on presentation then worsened to 6.9). Other differentials include vagal or orthostasis related symptoms. Feels better now. Some  orthostatic change noted but not very symptomatic on walking now. Support stockings advised.  2. Hyperkalemia: Improved with medical management , did not need dialysis. ?Related to chronic prednisone /ACE1 intake. ACEI on hold. Recieved Lokelma during hospital course through renal and now off.   3. Severe Fe deficiency anemia: Hgb dropped to 6.9 on July10 and received 2 units PRBC. Hemoglobin post transfusion improved to 9.2 but been downtrending again and today at 7.7.  Appreciate Renal and vascular surgery assistance. No concerns for ongoing bleeding at surgical site per Dr fields/Dr Donzetta Matters. Anemia felt to be delayed response to operative ABL plus extensive brusing/ subcutaneous bleeding at surgical site. Patient reports dark stools and relates to fe pills,OCB -ve. Will r/o retroperitoneal hematoma given ongoing drop in hgb without a clear source.    4. IgA nephropathy, Post renal transplant, now Stage V CKD: Not on HD . Creat stable at 4.3. Resumed home meds including prednisone  5.Left arm swelling: Seen by Dr Shara Blazing surgery who are not very concerned about bleeding but feel he may need intervention if left forearm/hand swelling continue to worsen. No signs of compartment syndrome with intact pulses. Will give lasix x1.  6. DM: Januvia can be resumed. SSI/pre meal insulin  7.  HTN: Meds held on admission due to #1 & #3. Orthostatic BP check revealed, Supine : 134/57, sitting : 138/60, standing 111/70.  Continue to hold metoprolol. Termisartan on hold due to #2 .    DVT prophylaxis: SCDs Code Status: DNR Family / Patient Communication: d/w patient Disposition Plan: home when medically clear. Transfer to telemetry     LOS: 3 days    Time spent: 35 minutes    Guilford Shi, MD [facility] Pager [phone]  If 7PM-7AM, please contact night-coverage [email or web address] Password [facility] 12/29/2018, 1:07 PM

## 2018-12-29 NOTE — Progress Notes (Signed)
Asked by hospitalist service to check pt left arm.  Pt with left upper arm AV graft Dr Donzetta Matters 3 days ago.  Has had some swelling since that time.  Left arm incisions healing, some ecchymosis No left radial pulse but no numbness A few chest wall collaterals  Pt s/p left arm AV graft with forearm and hand edema.  Most likely secondary to ligation of basilic sidebranches should improve with time  If not will consider shuntogram in a few weeks  Elevate arm to help with swelling  Ruta Hinds, MD [facility]: [phone] Pager: [phone]

## 2018-12-29 NOTE — Progress Notes (Addendum)
[facility] Progress Note    Assessment/ Plan:    # Hyperkalemia: s/p calcium gluconate, 1 amp of bicarbonate, 5 units of insulin and an amp of D50 and a dose of Lokelma, now down to 5 this AM.  Off ARB.  K better at 4.3 yesterday. Stopped Lokelma and fluids.  Check K this AM (have ordered RFP).    # Acute blood loss anemia: EBL in OR 250 mL per note, Hgb as 10.9 on preop Hemoccue (could be falsely high), down to 6.9 .  Now up to 8.8 s/p 2 u pRBCs.  Got Aranesp x 1. d/w Dr. Donzetta Matters, anemia not unexpected given operative findings, no imaging necessary at this time.  Evaled by Dr. Oneida Alar this AM, appreciate assistance.  CT abd/ pelvis with no RP hemorrhage.  FOBT negative, iron panel with iron def.  Will give feraheme x 1 today.    # s/p interpositional graft 7/9: sig L forearm swelling from compressive bandage (has been on since surg 7/9), will remove and place dry gauze dressing to keep incision clean and dry.   # CKD stage V in renal transplant recipient: At baseline creatinine of 4.32; now that K successfully rx'd, no acute indications for HD at this time.  On pred 5 and cyclosporine 50 BID  # Syncope: possibly d/t ABLA, per primary.  Orthostatics positive (but without dizziness), would continue to hold ARB and would resume Lasix 40 mg daily on d/c  # Metabolic acidosis: oral Na bicarb 1300 BID  # Secondary HPT: holding calcitriol  #.  Dispo: pending Hgb stable.    Subjective:    Hgb drifting down to 7.7.  CT scan abd/ pelvis today.  Feels OK for now.     Objective:   BP (!) 147/67 (BP Location: Right Wrist)   Pulse 91   Temp 98.1 F (36.7 C) (Oral)   Resp 12   Ht 5\' 6"  (1.676 m)   Wt 70.8 kg   SpO2 100%   BMI 25.20 kg/m   Intake/Output Summary (Last 24 hours) at 12/29/2018 1048 Last data filed at 12/29/2018 9211 Gross per 24 hour  Intake 140 ml  Output -  Net 140 ml   Weight change: 0.306 kg  Physical Exam: Gen: older gentleman, sitting in bed CVS:  RRR no m/r/g Resp: clear bilaterally no c/w/r Abd: soft, nontender NABS Ext: some R sided pedal edema, little L sided pedal edema.  LUE wrapped in bandage, examined. Incision c/d/i, soft, no gross hematomas felt, good grip strength, radial pulse weakly palpable, fingers warm without nailbed cyanosis.  No pain/ numbness/ tingling.  L forearm swelling better after removing compressive dressing but is still present.     Imaging: Ct Abdomen Pelvis Wo Contrast  Result Date: 12/29/2018 CLINICAL DATA:  Unexplained anemia. Suspected retroperitoneal hemorrhage. EXAM: CT ABDOMEN AND PELVIS WITHOUT CONTRAST TECHNIQUE: Multidetector CT imaging of the abdomen and pelvis was performed following the standard protocol without IV contrast. COMPARISON:  11/09/2017 FINDINGS: Lower chest: No acute findings. Hepatobiliary: No mass visualized on this unenhanced exam. Tiny calcified gallstones or sludge are seen, however there is no evidence of cholecystitis or biliary ductal dilatation. Pancreas: No mass or inflammatory process visualized on this unenhanced exam. Spleen:  Within normal limits in size. Adrenals/Urinary tract: Diffuse atrophy of native kidneys and multiple cysts are again seen, including small hyperdense hemorrhagic cyst in the lower pole the right kidney. No evidence of urolithiasis or hydronephrosis. Renal transplant is seen in the right iliac  fossa which is unremarkable in appearance on this unenhanced exam. No evidence of hydronephrosis. Unremarkable unopacified urinary bladder. Stomach/Bowel: Severe diverticulosis is seen mainly involving the sigmoid colon, however there is no evidence of diverticulitis. No evidence of obstruction, inflammatory process, or abnormal fluid collections. Vascular/Lymphatic: No evidence of retroperitoneal hemorrhage. No pathologically enlarged lymph nodes identified. 3.6 cm infrarenal abdominal aortic aneurysm is mildly enlarged from 3.3 cm previously. Reproductive:  No mass or  other significant abnormality. Other:  None. Musculoskeletal: No suspicious bone lesions identified. Advanced spondylosis is again seen at L4-5 with grade 2 anterolisthesis. IMPRESSION: No evidence of retroperitoneal hemorrhage or other acute findings. 3.6 cm infrarenal abdominal aortic aneurysm, mildly increased in size from prior exam. Recommend followup by ultrasound in 2 years. This recommendation follows ACR consensus guidelines: White Paper of the ACR Incidental Findings Committee II on Vascular Findings. J Am Coll Radiol 2013; 10:789-794. Aortic aneurysm NOS (ICD10-I71.9) Normal appearance of renal transplant. No evidence of transplant hydronephrosis. Cholelithiasis.  No radiographic evidence of cholecystitis. Colonic diverticulosis, without radiographic evidence of diverticulitis. Electronically Signed   By: Marlaine Hind M.D.   On: 12/29/2018 10:32    Labs: BMET Recent Labs  Lab 12/26/18 1020 12/26/18 1930 12/26/18 2115 12/27/18 0134 12/27/18 0829 12/27/18 1624 12/28/18 0324  NA 135 133* 136 135  --  137 135  K 5.2* 6.6* 5.4* 5.1 5.0 5.1 4.3  CL  --  103 104 102  --  102 101  CO2  --  21* 21* 19*  --  22 24  GLUCOSE 112* 280* 307* 251*  --  180* 151*  BUN  --  60* 58* 60*  --  62* 64*  CREATININE  --  4.31* 4.32* 4.32*  --  4.42* 4.33*  CALCIUM  --  9.1 9.4 9.0  --  8.9 9.0  PHOS  --   --   --   --   --  4.0  --    CBC Recent Labs  Lab 12/26/18 1930 12/27/18 0134 12/27/18 1624 12/28/18 0324 12/28/18 1307 12/29/18 0509  WBC 15.8* 10.7*  --  10.3  --   --   NEUTROABS 15.1*  --   --   --   --   --   HGB 8.1* 6.9* 9.2* 8.8* 8.2* 7.7*  HCT 26.8* 22.1* 27.5* 26.0* 25.2* 23.7*  MCV 100.8* 98.2  --  93.5  --   --   PLT 334 298  --  259  --   --     Medications:    . cycloSPORINE modified  50 mg Oral BID  . insulin aspart  0-5 Units Subcutaneous QHS  . insulin aspart  0-9 Units Subcutaneous TID WC  . predniSONE  5 mg Oral Daily  . sodium bicarbonate  1,300 mg Oral BID       Madelon Lips MD [facility] pgr [phone] 12/29/2018, 10:48 AM

## 2018-12-29 NOTE — Plan of Care (Signed)
  Problem: Health Behavior/Discharge Planning: Goal: Ability to manage health-related needs will improve Outcome: Progressing   Problem: Clinical Measurements: Goal: Diagnostic test results will improve Outcome: Progressing   Problem: Clinical Measurements: Goal: Respiratory complications will improve Outcome: Completed/Met Goal: Cardiovascular complication will be avoided Outcome: Completed/Met

## 2018-12-29 NOTE — Evaluation (Signed)
Physical Therapy Evaluation Patient Details Name: Caleb Hicks MRN: [id number] DOB: 1940-12-10 Today's Date: 12/29/2018   History of Present Illness  Caleb Hicks is a 78 y.o. 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 He says he quickly regained consciousness   Clinical Impression   Pt admitted with above diagnosis. Pt currently with functional limitations due to the deficits listed below (see PT Problem List). Managing independently prior to admission; Presents to PT with notable tachycardia with activity, and SBP drop of more than 20 mmHg with incr time in upright activity/walking; Agree with compression stockings; placed OT consult for ADLs, energy conservation;  Pt will benefit from skilled PT to increase their independence and safety with mobility to allow discharge to the venue listed below.      12/29/18 0700  Vital Signs  Patient Position (if appropriate) Orthostatic Vitals  Orthostatic Lying   BP- Lying 148/79  Pulse- Lying 87  Orthostatic Sitting  BP- Sitting 163/85  Pulse- Sitting 102  Orthostatic Standing at 0 minutes  BP- Standing at 0 minutes 142/77  Pulse- Standing at 0 minutes 143  Orthostatic Standing at 3 minutes  BP- Standing at 3 minutes 114/82 Greater than 20 SBP drop  Pulse- Standing at 3 minutes 147        Follow Up Recommendations Home health PT;Supervision - Intermittent    Equipment Recommendations  Rolling walker with 5" wheels;3in1 (PT)    Recommendations for Other Services OT consult(ordered per protocol)     Precautions / Restrictions Precautions Precautions: Fall Precaution Comments: watch for tacycardia and SBP drop with incr time in  standing Restrictions Weight Bearing Restrictions: No      Mobility  Bed Mobility Overal bed mobility: Modified Independent             General bed mobility comments: no difficulty sitting up to EOB  Transfers Overall transfer level: Needs assistance Equipment used: Rolling walker (2 wheeled) Transfers: Sit to/from Stand Sit to Stand: Min assist         General transfer comment: slight min assist to steady RW as he tended to pull up on RW; noted he braced backs of LE s againts bed  Ambulation/Gait Ambulation/Gait assistance: Min guard Gait Distance (Feet): 120 Feet Assistive device: Rolling walker (2 wheeled) Gait Pattern/deviations: Step-through pattern Gait velocity: slow   General Gait Details: Cues to self-monitor for activity tolerance; Noted his HR incr to 155 observed highest during walk  Stairs            Wheelchair Mobility    Modified Rankin (Stroke Patients Only)       Balance Overall balance assessment: Needs assistance           Standing balance-Leahy Scale: Poor(approaching Fair) Standing balance comment: More need for RW than pt anticipated                             Pertinent Vitals/Pain Pain Assessment: No/denies pain    Home Living Family/patient expects to be discharged to:: Private residence Living Arrangements: Alone Available Help at Discharge: Family;Available PRN/intermittently(drives to daughter's home for dinner) Type of Home: Other(Comment)(Condo, lives on 3rd floor) Home  Access: Elevator     Home Layout: One level Home Equipment: (walking stick) Additional Comments: Daughter does his grocery shopping at this time    Prior Function Level of Independence: Independent               Hand Dominance   Dominant Hand: Right    Extremity/Trunk Assessment   Upper Extremity Assessment Upper Extremity Assessment: Defer to OT evaluation(Noted LUE swelling post fistula creation)    Lower  Extremity Assessment Lower Extremity Assessment: Generalized weakness       Communication   Communication: No difficulties  Cognition Arousal/Alertness: Awake/alert Behavior During Therapy: WFL for tasks assessed/performed Overall Cognitive Status: Within Functional Limits for tasks assessed(for simple mobility tasks)                                        General Comments General comments (skin integrity, edema, etc.): Walked on room air and Os sats did drop to mid 80s (though am not super confident in that reading with near-flat pleth wave) -- notable though, because his HR DID incr to 150s; Pt did not report dizziness; but he did report the need to sit down immediately upon getting back to the room despite the plan to take a standing BP before sitting down    Exercises     Assessment/Plan    PT Assessment Patient needs continued PT services  PT Problem List Decreased strength;Decreased activity tolerance;Decreased balance;Decreased mobility;Decreased knowledge of use of DME;Decreased knowledge of precautions;Cardiopulmonary status limiting activity       PT Treatment Interventions DME instruction;Gait training;Stair training;Functional mobility training;Therapeutic activities;Therapeutic exercise;Balance training;Patient/family education    PT Goals (Current goals can be found in the Care Plan section)  Acute Rehab PT Goals Patient Stated Goal: Hopes to be home soon PT Goal Formulation: With patient Time For Goal Achievement: 01/12/19 Potential to Achieve Goals: Good    Frequency Min 3X/week   Barriers to discharge Decreased caregiver support Lives alone, and the decr SBP in standing after upright activity and wlaking is of concern; Ordered OT consult per protocol for ADLs    Co-evaluation               AM-PAC PT "6 Clicks" Mobility  Outcome Measure Help needed turning from your back to your side while in a flat bed without using bedrails?:  None Help needed moving from lying on your back to sitting on the side of a flat bed without using bedrails?: None Help needed moving to and from a bed to a chair (including a wheelchair)?: None Help needed standing up from a chair using your arms (e.g., wheelchair or bedside chair)?: A Little Help needed to walk in hospital room?: A Little Help needed climbing 3-5 steps with a railing? : A Little 6 Click Score: 21    End of Session Equipment Utilized During Treatment: Gait belt Activity Tolerance: Patient limited by fatigue Patient left: in bed;with call bell/phone within reach;Other (comment)(sitting EOB in prep fo ra bath) Nurse Communication: Mobility status PT Visit Diagnosis: Unsteadiness on feet (R26.81);Other abnormalities of gait and mobility (R26.89)    Time: 0174-9449 PT Time Calculation (min) (ACUTE ONLY): 30 min   Charges:   PT Evaluation $PT Eval Moderate Complexity: 1 Mod PT Treatments $Gait Training: 8-22 mins        Roney Marion, PT  Acute Rehabilitation Services Pager [phone] Office [phone]   [place]  [facility] [place] 12/29/2018, 10:22 AM

## 2018-12-30 DIAGNOSIS — N185 Chronic kidney disease, stage 5: Secondary | ICD-10-CM

## 2018-12-30 DIAGNOSIS — E875 Hyperkalemia: Principal | ICD-10-CM

## 2018-12-30 DIAGNOSIS — E1122 Type 2 diabetes mellitus with diabetic chronic kidney disease: Secondary | ICD-10-CM

## 2018-12-30 DIAGNOSIS — I10 Essential (primary) hypertension: Secondary | ICD-10-CM

## 2018-12-30 DIAGNOSIS — Z940 Kidney transplant status: Secondary | ICD-10-CM

## 2018-12-30 DIAGNOSIS — D62 Acute posthemorrhagic anemia: Secondary | ICD-10-CM

## 2018-12-30 LAB — BASIC METABOLIC PANEL
Anion gap: 9 (ref 5–15)
BUN: 58 mg/dL — ABNORMAL HIGH (ref 8–23)
CO2: 24 mmol/L (ref 22–32)
Calcium: 8.7 mg/dL — ABNORMAL LOW (ref 8.9–10.3)
Chloride: 105 mmol/L (ref 98–111)
Creatinine, Ser: 3.29 mg/dL — ABNORMAL HIGH (ref 0.61–1.24)
GFR calc Af Amer: 20 mL/min — ABNORMAL LOW (ref >60)
GFR calc non Af Amer: 17 mL/min — ABNORMAL LOW (ref >60)
Glucose, Bld: 119 mg/dL — ABNORMAL HIGH (ref 70–99)
Potassium: 3.7 mmol/L (ref 3.5–5.1)
Sodium: 138 mmol/L (ref 135–145)

## 2018-12-30 LAB — GLUCOSE, CAPILLARY
Glucose-Capillary: 162 mg/dL — ABNORMAL HIGH (ref 70–99)
Glucose-Capillary: 128 mg/dL — ABNORMAL HIGH (ref 70–99)
Glucose-Capillary: 110 mg/dL — ABNORMAL HIGH (ref 70–99)
Glucose-Capillary: 152 mg/dL — ABNORMAL HIGH (ref 70–99)

## 2018-12-30 LAB — CBC
HCT: 24.4 % — ABNORMAL LOW (ref 39.0–52.0)
Hemoglobin: 7.7 g/dL — ABNORMAL LOW (ref 13.0–17.0)
MCH: 30.3 pg (ref 26.0–34.0)
MCHC: 31.6 g/dL (ref 30.0–36.0)
MCV: 96.1 fL (ref 80.0–100.0)
Platelets: 264 10*3/uL (ref 150–400)
RBC: 2.54 MIL/uL — ABNORMAL LOW (ref 4.22–5.81)
RDW: 14.7 % (ref 11.5–15.5)
WBC: 6.6 10*3/uL (ref 4.0–10.5)
nRBC: 0 % (ref 0.0–0.2)

## 2018-12-30 NOTE — Progress Notes (Signed)
OT Evaluation  PTA, pt independent with ADL and mobility. Pt states he fatigues easily with ADL. Pt states he does not go into community (Pre-covid) due to fatigue and has become "somewhat depressed". Feel pt would benefit form use of rollator in the home as well as eventually in the community. Pt does not need a RW or BSC as he has a RW and uses a shower chair. Will return to have pt practice with the rollator, educate pt on energy conservation strategies and complete education regarding edema control LUE. Will contact CM regarding DC recommendations.     12/30/18 1200  OT Visit Information  Last OT Received On 12/30/18  Assistance Needed +1  History of Present Illness Caleb Hicks is a 78 y.o. 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 He says he quickly regained consciousness   Precautions  Precautions Fall  Precaution Comments watch for tacycardia and SBP drop with incr time in standing  Restrictions  Weight Bearing Restrictions No  Home Living  Family/patient expects to be discharged to: Private residence  Living Arrangements Alone  Available Help at Discharge Family;Available PRN/intermittently (drives to daughter's home for dinner)  Type of Home Other(Comment) (Condo, lives on 3rd floor)  [place] One level  Bathroom Shower/Tub Walk-in Cytogeneticist Yes  How Accessible Accessible via walker  [place] - 2 wheels;Shower seat (walking stick)  Additional Comments Daughter does his grocery shopping at this time  Prior Function  Level of Independence Independent  Communication  Communication No difficulties  Pain  Assessment  Pain Assessment Faces  Faces Pain Scale 2  Pain Location LUE discomfort  Cognition  Arousal/Alertness Awake/alert  Behavior During Therapy WFL for tasks assessed/performed  Overall Cognitive Status No family/caregiver present to determine baseline cognitive functioning (for simple mobility tasks)  General Comments increased time for processing  Upper Extremity Assessment  Upper Extremity Assessment LUE deficits/detail  LUE Deficits / Details Moderate edema; 3+/5 grip strength but functional   Lower Extremity Assessment  Lower Extremity Assessment Defer to PT evaluation  Cervical / Trunk Assessment  Cervical / Trunk Assessment Normal  ADL  Overall ADL's  Needs assistance/impaired  Functional mobility during ADLs Supervision/safety;Rolling walker  General ADL Comments Overall set up. Pt fatigues easily with ADL. Began educaiton on energy conservation techniques  Bed Mobility  Overal bed mobility Modified Independent  General bed mobility comments no difficulty sitting up to EOB  Transfers  Overall transfer level Needs assistance  Equipment used Rolling walker (2 wheeled)  Transfers Sit to/from Stand  Sit to Stand Supervision  Balance  Overall balance assessment Needs assistance  Sitting balance-Leahy Scale Good  Standing balance-Leahy Scale Fair (approaching Fair)  OT - End of Session  Equipment Utilized During Treatment Rolling walker  Activity Tolerance Patient tolerated treatment well  Patient left in bed;with call bell/phone within reach  Nurse Communication Mobility status;Other (comment) (DC plan)  OT Assessment  OT Recommendation/Assessment Patient needs continued OT Services  OT Visit Diagnosis Unsteadiness on feet (R26.81);Muscle weakness (generalized) (M62.81)  OT Problem List Decreased strength;Decreased activity tolerance;Decreased knowledge of use of DME or AE;Obesity;Increased edema  OT Plan  OT Frequency (ACUTE ONLY) Min 2X/week  OT  Treatment/Interventions (ACUTE ONLY) Self-care/ADL training;Therapeutic exercise;Energy conservation;DME and/or AE instruction;Therapeutic activities;Patient/family education  AM-PAC OT "6 Clicks" Daily Activity Outcome Measure (Version 2)  Help from another person eating meals? 4  Help from another person taking care of personal grooming? 4  Help from another person toileting, which includes using toliet, bedpan, or urinal? 4  Help from another person bathing (including washing, rinsing, drying)? 3  Help from another person to put on and taking off regular upper body clothing? 3  Help from another person to put on and taking off regular lower body clothing? 3  6 Click Score 21  OT Recommendation  Follow Up Recommendations No OT follow up;Supervision - Intermittent  OT Equipment Other (comment) (rollator)  Individuals Consulted  Consulted and Agree with Results and Recommendations Patient  Acute Rehab OT Goals  Patient Stated Goal Hopes to be home soon  OT Time Calculation  OT Start Time (ACUTE ONLY) 1206  OT Stop Time (ACUTE ONLY) 1224  OT Time Calculation (min) 18 min  OT General Charges  $OT Visit 1 Visit  OT Evaluation  $OT Eval Moderate Complexity 1 Mod  Written Expression  Dominant Hand Right   Maurie Boettcher, OT/L   Acute OT Clinical Specialist Acute Rehabilitation Services Pager [phone] Office [phone]

## 2018-12-30 NOTE — Care Management Important Message (Signed)
Important Message  Patient Details  Name: Caleb Hicks MRN: [id number] Date of Birth: 12/02/1940   Medicare Important Message Given:  Yes     Shelda Altes 12/30/2018, 2:35 PM

## 2018-12-30 NOTE — Discharge Summary (Signed)
Physician Discharge Summary  Caleb Hicks OFB:[id number] DOB: 1941-04-20 DOA: 12/26/2018  PCP: [name], MD  Admit date: 12/26/2018 Discharge date: 12/30/2018  Admitted From: Home Disposition: Home  Recommendations for Outpatient Follow-up:  1. Follow up with PCP in 1 week 2. Follow up with nephrology in 2 weeks 3. Please obtain BMP/CBC in one week 4. Please follow up on the following pending results: None  Home Health: PT/OT Equipment/Devices: Rolling walker  Discharge Condition: [place] CODE STATUS: DNR Diet recommendation: Renal/carb modified   Brief/Interim Summary:  Admission HPI written by Etta Quill, DO   Chief Complaint: Syncope  HPI: Caleb Hicks is a 78 y.o. 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  Patient had fistula created in LUE earlier today, there was some blood loss noted due to bleeding collaterals during surgery according to op note.  After leaving hospital to go home, initially felt okay but then on exiting the car to walk into the house he felt lightheaded and then fell down to the ground. He says he quickly regained consciousness and now does not have any complaints.  No current CP, SOB, lightheadedness, weakness, etc.  Mild pain at fistula site but no obvious swelling, bleeding, numbness or weakness in hand muscles.   Hospital course:  Syncope Possibly secondary to arrhythmia in setting of hyperkalemia vs orthostatic from acute blood loss anemia. Workup otherwise unremarkable. PT consulted and recommendations for home health. Positive orthostatic vitals. Currently, no orthostatic symptoms.  Hyperkalemia Acute. In setting of CKD stage V. Managed with calcium gluconate, 1 amp bicarbonate, 5 untis of insulin with an amp of D50 and Lokelma. Potassium improved from 6.6 and down to below 5 and is stable. Nephrology  follow-up.  Severe iron deficiency anemia Acute blood loss anemia Post op anemia. Patient is on iron tablets at home. Iron of 14 with low TIBC and low iron saturations; ferritin wnl. Received 2 units of PRBC on 7/10 secondary to a hemoglobin of 6.9 with improvement to 9.2. hemoglobin trended down to 7.7.  Feraheme infusion on 7/12. Hemoglobin remained stable at 7.7 from day prior to discharge to the day of discharge. FOBT obtained and was negative. Nephrology will follow-up repeat CBC.  CKD stage V IgA nephropathy S/p transplant. Not currently on HD. Baseline creatinine of about 4.3-4.5. Creatinine of 4.31 on admission down to 3.29 the day of discharge. Continued prednisone and cyclosporin.  Left arm swelling In setting of left AV graft. Vascular surgery consulted and evaluated arm. Expected swelling. No acute intervention planned. Will need vascular surgery follow-up if edema does not improve or if it worsens for angiogram.  Diabetes mellitus, type 2 Continue home regimen  Essential hypertension Continued metoprolol inpatient. telmisartan held secondary to hyperkalemia and kidney disease. Telmesartan discontinued on discharge and lasix restarted. Will follow-up with nephrology for possible resumption of ARB therapy.  Discharge Diagnoses:  Principal Problem:   Hyperkalemia Active Problems:   Acute blood loss as cause of postoperative anemia   HTN (hypertension)   Renal transplant, status post: 21 years ago: 1997   Syncope   DM2 (diabetes mellitus, type 2) [facility]    Discharge Instructions  Discharge Instructions    Call MD for:  difficulty breathing, headache or visual disturbances   Complete by: As directed    Call MD for:  persistant dizziness or light-headedness   Complete by: As directed  Increase activity slowly   Complete by: As directed      Allergies as of 12/30/2018      Reactions   Norvasc [amlodipine Besylate] Other (See Comments)   Weight gain   Tape Other  (See Comments)   Tears skin      Medication List    STOP taking these medications   allopurinol 100 MG tablet Commonly known as: ZYLOPRIM   telmisartan 80 MG tablet Commonly known as: MICARDIS     TAKE these medications   aspirin 325 MG EC tablet Take 325 mg by mouth daily.   atorvastatin 40 MG tablet Commonly known as: LIPITOR Take 1 tablet (40 mg total) by mouth daily.   calcitRIOL 0.25 MCG capsule Commonly known as: ROCALTROL Take 1 capsule (0.25 mcg total) by mouth daily. What changed: when to take this   calcium carbonate 1250 (500 Ca) MG tablet Commonly known as: OS-CAL - dosed in mg of elemental calcium Take 1-2 tablets by mouth See admin instructions. 2 tablets in the morning, and 1 tablet in the evening   furosemide 40 MG tablet Commonly known as: LASIX Take 1 tablet (40 mg total) by mouth daily.   IRON PO Take 1 tablet by mouth 2 (two) times a day.   MAGnesium-Oxide 400 (241.3 Mg) MG tablet Generic drug: magnesium oxide Take 1 tablet by mouth 2 (two) times a day.   metoprolol tartrate 50 MG tablet Commonly known as: LOPRESSOR Take 50 mg by mouth 2 (two) times daily.   Neoral 25 MG capsule Generic drug: cycloSPORINE modified Take 2 tablets by mouth 2 (two) times a day. What changed: Another medication with the same name was removed. Continue taking this medication, and follow the directions you see here.   oxyCODONE-acetaminophen 5-325 MG tablet Commonly known as: PERCOCET/ROXICET Take 1 tablet by mouth every 6 (six) hours as needed.   predniSONE 5 MG tablet Commonly known as: DELTASONE Take 5 mg by mouth daily.   senna-docusate 8.6-50 MG tablet Commonly known as: Senokot-S Take 1 tablet by mouth at bedtime. What changed:   when to take this  reasons to take this   sitaGLIPtin 25 MG tablet Commonly known as: Januvia Take 1 tablet (25 mg total) by mouth daily.   sodium bicarbonate 650 MG tablet Take 1,300 mg by mouth 2 (two) times  daily.            Durable Medical Equipment  (From admission, onward)         Start     Ordered   12/30/18 1259  For home use only DME 3 n 1  Once     12/30/18 1258   12/30/18 1237  For home use only DME Walker rolling  Once    Question:  Patient needs a walker to treat with the following condition  Answer:  Syncope   12/30/18 1236         Follow-up Information    [name], MD. Schedule an appointment as soon as possible for a visit in 1 week(s).   Specialty: Family Medicine Why: Hospital follow-up Contact information: [address] [place] [address] [phone]        Mauricia Area, MD. Schedule an appointment as soon as possible for a visit in 2 week(s).   Specialty: Nephrology Why: Hospital follow-up Contact information: [address] [place] [address] [phone]          Allergies  Allergen Reactions   Norvasc [Amlodipine Besylate] Other (See Comments)  Weight gain   Tape Other (See Comments)    Tears skin    Consultations:  Nephrology   Procedures/Studies: Ct Abdomen Pelvis Wo Contrast  Result Date: 12/29/2018 CLINICAL DATA:  Unexplained anemia. Suspected retroperitoneal hemorrhage. EXAM: CT ABDOMEN AND PELVIS WITHOUT CONTRAST TECHNIQUE: Multidetector CT imaging of the abdomen and pelvis was performed following the standard protocol without IV contrast. COMPARISON:  11/09/2017 FINDINGS: Lower chest: No acute findings. Hepatobiliary: No mass visualized on this unenhanced exam. Tiny calcified gallstones or sludge are seen, however there is no evidence of cholecystitis or biliary ductal dilatation. Pancreas: No mass or inflammatory process visualized on this unenhanced exam. Spleen:  Within normal limits in size. Adrenals/Urinary tract: Diffuse atrophy of native kidneys and multiple cysts are again seen, including small hyperdense hemorrhagic cyst in the lower pole the right kidney. No evidence of urolithiasis or hydronephrosis.  Renal transplant is seen in the right iliac fossa which is unremarkable in appearance on this unenhanced exam. No evidence of hydronephrosis. Unremarkable unopacified urinary bladder. Stomach/Bowel: Severe diverticulosis is seen mainly involving the sigmoid colon, however there is no evidence of diverticulitis. No evidence of obstruction, inflammatory process, or abnormal fluid collections. Vascular/Lymphatic: No evidence of retroperitoneal hemorrhage. No pathologically enlarged lymph nodes identified. 3.6 cm infrarenal abdominal aortic aneurysm is mildly enlarged from 3.3 cm previously. Reproductive:  No mass or other significant abnormality. Other:  None. Musculoskeletal: No suspicious bone lesions identified. Advanced spondylosis is again seen at L4-5 with grade 2 anterolisthesis. IMPRESSION: No evidence of retroperitoneal hemorrhage or other acute findings. 3.6 cm infrarenal abdominal aortic aneurysm, mildly increased in size from prior exam. Recommend followup by ultrasound in 2 years. This recommendation follows ACR consensus guidelines: White Paper of the ACR Incidental Findings Committee II on Vascular Findings. J Am Coll Radiol 2013; 10:789-794. Aortic aneurysm NOS (ICD10-I71.9) Normal appearance of renal transplant. No evidence of transplant hydronephrosis. Cholelithiasis.  No radiographic evidence of cholecystitis. Colonic diverticulosis, without radiographic evidence of diverticulitis. Electronically Signed   By: Marlaine Hind M.D.   On: 12/29/2018 10:32   Vas US Duplex Dialysis Access (avf,avg)  Result Date: 12/13/2018 DIALYSIS ACCESS Reason for Exam: Routine follow up. Access Site: Left Upper Extremity. Access Type: Basilic vein transposition. History: First stage BVT on 11/12/18. Performing Technologist: Ralene Cork RVT  Examination Guidelines: A complete evaluation includes B-mode imaging, spectral Doppler, color Doppler, and power Doppler as needed of all accessible portions of each vessel.  Unilateral testing is considered an integral part of a complete examination. Limited examinations for reoccurring indications may be performed as noted.  Findings: +--------------------+----------+-----------------+--------+  AVF                  PSV (cm/s) Flow Vol (mL/min) Comments  +--------------------+----------+-----------------+--------+  Native artery inflow    241            679                  +--------------------+----------+-----------------+--------+  AVF Anastomosis         820                                 +--------------------+----------+-----------------+--------+  +------------+----------+-------------+----------+------------+  OUTFLOW VEIN PSV (cm/s) Diameter (cm) Depth (cm)   Describe    +------------+----------+-------------+----------+------------+  Prox UA          77         0.80  2.75                  +------------+----------+-------------+----------+------------+  Mid UA          121         0.62         2.44                  +------------+----------+-------------+----------+------------+  Dist UA          91         0.82         2.39    branch 0.5cm  +------------+----------+-------------+----------+------------+  AC Fossa        510                                            +------------+----------+-------------+----------+------------+   Summary: 1. Stenosis at the anastomosis with a ratio >3. 2. Branch in the distal upper arm measuring 0.5cm.  *See table(s) above for measurements and observations.  Diagnosing physician: Servando Snare MD Electronically signed by Servando Snare MD on 12/13/2018 at 3:15:27 PM.    --------------------------------------------------------------------------------   Final      Subjective: No issues overnight. Worked well with PT. No concerns. No lightheadedness. No black or bloody stools  Discharge Exam: Vitals:   12/30/18 0507 12/30/18 0819  BP: (!) 160/60 140/66  Pulse: 83 82  Resp: 20 17  Temp: 98.3 F (36.8 C) 97.6 F (36.4 C)  SpO2:  96% 100%   Vitals:   12/29/18 1945 12/30/18 0504 12/30/18 0507 12/30/18 0819  BP: (!) 167/80  (!) 160/60 140/66  Pulse: (!) 104  83 82  Resp: 19  20 17   Temp: 98.4 F (36.9 C)  98.3 F (36.8 C) 97.6 F (36.4 C)  TempSrc: Oral Oral Oral Oral  SpO2: 100%  96% 100%  Weight:   72.1 kg   Height:        General: Pt is alert, awake, not in acute distress Cardiovascular: RRR, S1/S2 +, no rubs, no gallops Respiratory: CTA bilaterally, no wheezing, no rhonchi Abdominal: Soft, NT, ND, bowel sounds + Extremities: no edema, no cyanosis    The results of significant diagnostics from this hospitalization (including imaging, microbiology, ancillary and laboratory) are listed below for reference.     Microbiology: Recent Results (from the past 240 hour(s))  SARS Coronavirus 2 (Performed in [place] hospital lab)     Status: None   Collection Time: 12/23/18 10:31 AM   Specimen: Nasal Swab  Result Value Ref Range Status   SARS Coronavirus 2 NEGATIVE NEGATIVE Final    Comment: (NOTE) SARS-CoV-2 target nucleic acids are NOT DETECTED. The SARS-CoV-2 RNA is generally detectable in upper and lower respiratory specimens during the acute phase of infection. Negative results do not preclude SARS-CoV-2 infection, do not rule out co-infections with other pathogens, and should not be used as the sole basis for treatment or other patient management decisions. Negative results must be combined with clinical observations, patient history, and epidemiological information. The expected result is Negative. Fact Sheet for Patients: SugarRoll.be Fact Sheet for Healthcare Providers: https://www.woods-mathews.com/ This test is not yet approved or cleared by the [place] FDA and  has been authorized for detection and/or diagnosis of SARS-CoV-2 by FDA under an Emergency Use Authorization (EUA). This EUA will remain  in effect (meaning this test can be used)  for  the duration of the COVID-19 declaration under Section 56 4(b)(1) of the Act, 21 U.S.C. section 360bbb-3(b)(1), unless the authorization is terminated or revoked sooner. Performed at [facility], [place] [address], [place], [place] [address]   SARS Coronavirus 2 (CEPHEID - Performed in [place] hospital lab), Hosp Order     Status: None   Collection Time: 12/26/18  8:34 PM   Specimen: Nasopharyngeal Swab  Result Value Ref Range Status   SARS Coronavirus 2 NEGATIVE NEGATIVE Final    Comment: (NOTE) If result is NEGATIVE SARS-CoV-2 target nucleic acids are NOT DETECTED. The SARS-CoV-2 RNA is generally detectable in upper and lower  respiratory specimens during the acute phase of infection. The lowest  concentration of SARS-CoV-2 viral copies this assay can detect is 250  copies / mL. A negative result does not preclude SARS-CoV-2 infection  and should not be used as the sole basis for treatment or other  patient management decisions.  A negative result may occur with  improper specimen collection / handling, submission of specimen other  than nasopharyngeal swab, presence of viral mutation(s) within the  areas targeted by this assay, and inadequate number of viral copies  (<250 copies / mL). A negative result must be combined with clinical  observations, patient history, and epidemiological information. If result is POSITIVE SARS-CoV-2 target nucleic acids are DETECTED. The SARS-CoV-2 RNA is generally detectable in upper and lower  respiratory specimens dur ing the acute phase of infection.  Positive  results are indicative of active infection with SARS-CoV-2.  Clinical  correlation with patient history and other diagnostic information is  necessary to determine patient infection status.  Positive results do  not rule out bacterial infection or co-infection with other viruses. If result is PRESUMPTIVE POSTIVE SARS-CoV-2 nucleic acids MAY BE PRESENT.   A presumptive  positive result was obtained on the submitted specimen  and confirmed on repeat testing.  While 2019 novel coronavirus  (SARS-CoV-2) nucleic acids may be present in the submitted sample  additional confirmatory testing may be necessary for epidemiological  and / or clinical management purposes  to differentiate between  SARS-CoV-2 and other Sarbecovirus currently known to infect humans.  If clinically indicated additional testing with an alternate test  methodology [phone]) is advised. The SARS-CoV-2 RNA is generally  detectable in upper and lower respiratory sp ecimens during the acute  phase of infection. The expected result is Negative. Fact Sheet for Patients:  StrictlyIdeas.no Fact Sheet for Healthcare Providers: BankingDealers.co.za This test is not yet approved or cleared by the [place] FDA and has been authorized for detection and/or diagnosis of SARS-CoV-2 by FDA under an Emergency Use Authorization (EUA).  This EUA will remain in effect (meaning this test can be used) for the duration of the COVID-19 declaration under Section 564(b)(1) of the Act, 21 U.S.C. section 360bbb-3(b)(1), unless the authorization is terminated or revoked sooner. Performed at [facility], [place] [address] [place], [place] [address]      Labs: BNP (last 3 results) No results for input(s): BNP in the last 8760 hours. Basic Metabolic Panel: Recent Labs  Lab 12/27/18 0134 12/27/18 0829 12/27/18 1624 12/28/18 0324 12/29/18 1154 12/30/18 0442  NA 135  --  137 135 140 138  K 5.1 5.0 5.1 4.3 3.8 3.7  CL 102  --  102 101 105 105  CO2 19*  --  22 24 25 24   GLUCOSE 251*  --  180* 151* 136* 119*  BUN 60*  --  62* 64* 59* 58*  CREATININE 4.32*  --  4.42* 4.33* 3.48* 3.29*  CALCIUM 9.0  --  8.9 9.0 8.7* 8.7*  PHOS  --   --  4.0  --  3.3  --    Liver Function Tests: Recent Labs  Lab 12/27/18 1624 12/29/18 1154  ALBUMIN 2.7* 2.4*   No  results for input(s): LIPASE, AMYLASE in the last 168 hours. No results for input(s): AMMONIA in the last 168 hours. CBC: Recent Labs  Lab 12/26/18 1930 12/27/18 0134 12/27/18 1624 12/28/18 0324 12/28/18 1307 12/29/18 0509 12/30/18 0442  WBC 15.8* 10.7*  --  10.3  --   --  6.6  NEUTROABS 15.1*  --   --   --   --   --   --   HGB 8.1* 6.9* 9.2* 8.8* 8.2* 7.7* 7.7*  HCT 26.8* 22.1* 27.5* 26.0* 25.2* 23.7* 24.4*  MCV 100.8* 98.2  --  93.5  --   --  96.1  PLT 334 298  --  259  --   --  264   Cardiac Enzymes: No results for input(s): CKTOTAL, CKMB, CKMBINDEX, TROPONINI in the last 168 hours. BNP: Invalid input(s): POCBNP CBG: Recent Labs  Lab 12/29/18 1104 12/29/18 1554 12/29/18 2123 12/30/18 0609 12/30/18 1158  GLUCAP 127* 196* 128* 110* 152*   D-Dimer No results for input(s): DDIMER in the last 72 hours. Hgb A1c No results for input(s): HGBA1C in the last 72 hours. Lipid Profile No results for input(s): CHOL, HDL, LDLCALC, TRIG, CHOLHDL, LDLDIRECT in the last 72 hours. Thyroid function studies No results for input(s): TSH, T4TOTAL, T3FREE, THYROIDAB in the last 72 hours.  Invalid input(s): FREET3 Anemia work up No results for input(s): VITAMINB12, FOLATE, FERRITIN, TIBC, IRON, RETICCTPCT in the last 72 hours. Urinalysis    Component Value Date/Time   COLORURINE YELLOW 04/09/2018 [place] 04/09/2018 1449   LABSPEC 1.011 04/09/2018 1449   PHURINE 5.0 04/09/2018 1449   GLUCOSEU NEGATIVE 04/09/2018 1449   HGBUR MODERATE (A) 04/09/2018 1449   BILIRUBINUR NEGATIVE 04/09/2018 1449   KETONESUR NEGATIVE 04/09/2018 1449   PROTEINUR NEGATIVE 04/09/2018 1449   NITRITE NEGATIVE 04/09/2018 1449   LEUKOCYTESUR NEGATIVE 04/09/2018 1449   Sepsis Labs Invalid input(s): PROCALCITONIN,  WBC,  LACTICIDVEN Microbiology Recent Results (from the past 240 hour(s))  SARS Coronavirus 2 (Performed in [place] hospital lab)     Status: None   Collection Time:  12/23/18 10:31 AM   Specimen: Nasal Swab  Result Value Ref Range Status   SARS Coronavirus 2 NEGATIVE NEGATIVE Final    Comment: (NOTE) SARS-CoV-2 target nucleic acids are NOT DETECTED. The SARS-CoV-2 RNA is generally detectable in upper and lower respiratory specimens during the acute phase of infection. Negative results do not preclude SARS-CoV-2 infection, do not rule out co-infections with other pathogens, and should not be used as the sole basis for treatment or other patient management decisions. Negative results must be combined with clinical observations, patient history, and epidemiological information. The expected result is Negative. Fact Sheet for Patients: SugarRoll.be Fact Sheet for Healthcare Providers: https://www.woods-mathews.com/ This test is not yet approved or cleared by the [place] FDA and  has been authorized for detection and/or diagnosis of SARS-CoV-2 by FDA under an Emergency Use Authorization (EUA). This EUA will remain  in effect (meaning this test can be used) for the duration of the COVID-19 declaration under Section 56 4(b)(1) of the Act, 21 U.S.C. section 360bbb-3(b)(1), unless the authorization is  terminated or revoked sooner. Performed at [facility], [place] [address], [place], [place] [address]   SARS Coronavirus 2 (CEPHEID - Performed in [place] hospital lab), Hosp Order     Status: None   Collection Time: 12/26/18  8:34 PM   Specimen: Nasopharyngeal Swab  Result Value Ref Range Status   SARS Coronavirus 2 NEGATIVE NEGATIVE Final    Comment: (NOTE) If result is NEGATIVE SARS-CoV-2 target nucleic acids are NOT DETECTED. The SARS-CoV-2 RNA is generally detectable in upper and lower  respiratory specimens during the acute phase of infection. The lowest  concentration of SARS-CoV-2 viral copies this assay can detect is 250  copies / mL. A negative result does not preclude SARS-CoV-2  infection  and should not be used as the sole basis for treatment or other  patient management decisions.  A negative result may occur with  improper specimen collection / handling, submission of specimen other  than nasopharyngeal swab, presence of viral mutation(s) within the  areas targeted by this assay, and inadequate number of viral copies  (<250 copies / mL). A negative result must be combined with clinical  observations, patient history, and epidemiological information. If result is POSITIVE SARS-CoV-2 target nucleic acids are DETECTED. The SARS-CoV-2 RNA is generally detectable in upper and lower  respiratory specimens dur ing the acute phase of infection.  Positive  results are indicative of active infection with SARS-CoV-2.  Clinical  correlation with patient history and other diagnostic information is  necessary to determine patient infection status.  Positive results do  not rule out bacterial infection or co-infection with other viruses. If result is PRESUMPTIVE POSTIVE SARS-CoV-2 nucleic acids MAY BE PRESENT.   A presumptive positive result was obtained on the submitted specimen  and confirmed on repeat testing.  While 2019 novel coronavirus  (SARS-CoV-2) nucleic acids may be present in the submitted sample  additional confirmatory testing may be necessary for epidemiological  and / or clinical management purposes  to differentiate between  SARS-CoV-2 and other Sarbecovirus currently known to infect humans.  If clinically indicated additional testing with an alternate test  methodology [phone]) is advised. The SARS-CoV-2 RNA is generally  detectable in upper and lower respiratory sp ecimens during the acute  phase of infection. The expected result is Negative. Fact Sheet for Patients:  StrictlyIdeas.no Fact Sheet for Healthcare Providers: BankingDealers.co.za This test is not yet approved or cleared by the [place]  FDA and has been authorized for detection and/or diagnosis of SARS-CoV-2 by FDA under an Emergency Use Authorization (EUA).  This EUA will remain in effect (meaning this test can be used) for the duration of the COVID-19 declaration under Section 564(b)(1) of the Act, 21 U.S.C. section 360bbb-3(b)(1), unless the authorization is terminated or revoked sooner. Performed at [facility], [place] [address], [place], [place] [address]      Time coordinating discharge: 35 minutes  SIGNED:   Cordelia Poche, MD [facility] 12/30/2018, 1:12 PM

## 2018-12-30 NOTE — Progress Notes (Signed)
Pt provided discharge instructions and education.  IV removed and intact. vitals stable. Pt has all belongings. Pt denies any complaints. Tx via wheelchair to valet to meet daughter.  Jerald Kief, RN

## 2018-12-30 NOTE — Progress Notes (Signed)
Pt's daughter Adonis Brook updated on pt's condition. Also notified of possible d/c. She states she is ready to provide his care when he is released. Advised her to call if any other questions.  Jerald Kief, RN

## 2018-12-30 NOTE — TOC Transition Note (Addendum)
Transition of Care [facility]) - CM/SW Discharge Note Marvetta Gibbons RN, BSN Transitions of Care Unit 4E- RN Case Manager [phone]   Patient Details  Name: Caleb Hicks MRN: [id number] Date of Birth: [birth year]  Transition of Care [facility]) CM/SW Contact:  Dawayne Patricia, RN Phone Number: 12/30/2018, 3:05 PM   Clinical Narrative:    Pt admitted with hyperkalemia/syncope. Pt stable for transition home today. Orders placed for HHPT/OT, and DME rollator. CM spoke with pt at bedside. Per pt has has RW and shower chair at home. Daughter is coming to provide transport home. Pt provided list for [facility] agency choice Per CMS guidelines from medicare.gov website with star ratings (copy placed in chart)- per pt he has selected [facility] as his first choice. Call made to [facility] with Alvis Lemmings for [facility] referral- referral for PT/OT has been accepted. Pt's address, phone # and PCP all confirmed with pt in epic. Call made to [facility] with [place] for DME need- rollator to be delivered to room prior to discharge.  Post discharge- call received from daughter Deanna Boehlke- pt is to stay with her for a few weeks- address is here in [facility]- [address] [phone]- phone [phone], she states that pt usually uses [facility] for [facility] services asking if referral can be made to them instead- call made to Butch Penny with [facility] however [facility] unable to accept referral- per daughter they will elect to stay with Alvis Lemmings for [facility] services- updated Tommi Rumps with [facility] on address where pt is staying.    Final next level of care: [place] Barriers to Discharge: No Barriers Identified   Patient Goals and CMS Choice Patient states their goals for this hospitalization and ongoing recovery are:: "get back to at least half way to normal" CMS Medicare.gov Compare Post Acute Care list provided to:: Patient Choice offered to / list presented to : Patient  Discharge Placement  Home with [facility]                     Discharge Plan  and Services   Discharge Planning Services: CM Consult Post Acute Care Choice: Home Health, Durable Medical Equipment          DME Arranged: Walker rolling with seat DME Agency: [employer] Date DME Agency Contacted: 12/30/18 Time DME Agency Contacted: [id number] Representative spoke with at DME Agency: zach blank HH Arranged: PT, OT HH Agency: [place] Date [place]: 12/30/18 Time [place]: 65 Representative spoke with at [place]: cory barnett  Social Determinants of Health ([place]) Interventions     Readmission Risk Interventions Readmission Risk Prevention Plan 12/30/2018  Transportation Screening Complete  PCP or Specialist Appt within 3-5 Days Complete  HRI or [place] Complete  Social Work Consult for [place] Planning/Counseling Complete  Palliative Care Screening Not Applicable  Medication Review Press photographer) Complete

## 2018-12-30 NOTE — Progress Notes (Signed)
OT Treatment Note  Completed education regarding rollator, energy conservation and edema control LUE. Pt states he plans to DC home with his daughter. CM contacted regarding recommendation for rollator. Pt very appreciative.     12/30/18 1300  OT Visit Information  Last OT Received On 12/30/18  Assistance Needed +1  History of Present Illness Caleb Hicks is a 78 y.o. 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 He says he quickly regained consciousness   Precautions  Precautions Fall  Pain Assessment  Pain Assessment No/denies pain  Cognition  Arousal/Alertness Awake/alert  Behavior During Therapy WFL for tasks assessed/performed  Upper Extremity Assessment  Upper Extremity Assessment LUE deficits/detail  ADL  General ADL Comments Educated on energy conservation strategies. Handout provided.   Transfers  Equipment used 4-wheeled walker  Transfers Sit to/from Stand  Sit to [employer] independent (Device/Increase time)  General Comments  General comments (skin integrity, edema, etc.) Educated on use of rollator. Pt able to return demsontrate. Feels rollator would help with his ability to complete IADL tasks and eventually assist with community mobility.   OT - End of Session  Equipment Utilized During Treatment Other (comment) (rollator)  Activity Tolerance Patient tolerated treatment well  Patient left in bed;with call bell/phone within reach  Nurse Communication Mobility status;Other (comment) (DC needs)  OT Assessment/Plan  OT Plan All goals met and education completed, patient discharged from OT services  OT Visit Diagnosis Unsteadiness on feet (R26.81);Muscle weakness (generalized) (M62.81)   Follow Up Recommendations No OT follow up;Supervision - Intermittent  OT Equipment Other (comment) (rollator)  AM-PAC OT "6 Clicks" Daily Activity Outcome Measure (Version 2)  Help from another person eating meals? 4  Help from another person taking care of personal grooming? 4  Help from another person toileting, which includes using toliet, bedpan, or urinal? 4  Help from another person bathing (including washing, rinsing, drying)? 3  Help from another person to put on and taking off regular upper body clothing? 3  Help from another person to put on and taking off regular lower body clothing? 3  6 Click Score 21  OT Goal Progression  Progress towards OT goals Goals met/education completed, patient discharged from OT  Acute Rehab OT Goals  Patient Stated Goal DC home today  OT Time Calculation  OT Start Time (ACUTE ONLY) 1245  OT Stop Time (ACUTE ONLY) 1257  OT Time Calculation (min) 12 min  OT General Charges  $OT Visit 1 Visit  OT Treatments  $Self Care/Home Management  8-22 mins  Maurie Boettcher, OT/L   Acute OT Clinical Specialist [place] Pager [phone] Office [phone]

## 2018-12-30 NOTE — Progress Notes (Signed)
[facility] Progress Note    Assessment/ Plan:    # Acute blood loss anemia: EBL in OR 250 mL per note, Hgb as 10.9 on preop Hemoccue (could be falsely high), down to 6.9 > improved to 8.8 s/p 2u pRBC but then down again 7/12 to 7.7.   Dr. Hollie Salk d/w Dr. Donzetta Matters, anemia not unexpected given operative findings, no imaging necessary at this time.  Evaled by Dr. Oneida Alar yest AM, appreciate assistance - nothing further needed currently.  CT abd/ pelvis with no RP hemorrhage.  FOBT negative, iron panel with iron def.  S.p feraheme 7/12.  Hb stable today.  Will have him seen in nephrology clinic in 2 weeks to f/u Hb - may need more iron +/- ESA.  Did receive aranesp x 1 dose here (101mcg on 7/9).   # Hyperkalemia: resolved.   # s/p interpositional graft 7/9: sig L forearm swelling from compressive bandage - per vascular edema likely due to ligation of collaterals and should improved.  If not, they will do angiogram in several weeks.   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  On pred 5 and cyclosporine 50 BID  # Syncope: possibly d/t ABLA, per primary.  Orthostatics positive (but without dizziness), would continue to hold ARB and would resume Lasix 40 mg daily on d/c  # Metabolic acidosis: oral Na bicarb 1300 BID  # Secondary HPT: holding calcitriol in light of PTH ~100 on last check.  Hold on DC.   #.  Dispo: today with stable Hb; d/w hospitalist.   Subjective:    Hgb stable at 7.7.  CT scan abd/ pelvis yesterday with no bleed.  Feels OK and feels ready for D/C.   Objective:   BP 140/66 (BP Location: Right Arm)   Pulse 82   Temp 97.6 F (36.4 C) (Oral)   Resp 17   Ht 5\' 6"  (1.676 m)   Wt 72.1 kg   SpO2 100%   BMI 25.65 kg/m   Intake/Output Summary (Last 24 hours) at 12/30/2018 1052 Last data filed at 12/30/2018 0951 Gross per 24 hour  Intake 937 ml  Output 1100 ml  Net -163 ml   Weight change: 1.27 kg  Physical  Exam: Gen: older gentleman, sitting in bed CVS: RRR no m/r/g Resp: clear bilaterally no c/w/r Abd: soft, nontender NABS Ext: some R sided pedal edema, little L sided pedal edema.  LUE wrapped in bandage, 1+ pitting edema; arm elevated on pillow.  Fingers warm and perfused.   Imaging: Ct Abdomen Pelvis Wo Contrast  Result Date: 12/29/2018 CLINICAL DATA:  Unexplained anemia. Suspected retroperitoneal hemorrhage. EXAM: CT ABDOMEN AND PELVIS WITHOUT CONTRAST TECHNIQUE: Multidetector CT imaging of the abdomen and pelvis was performed following the standard protocol without IV contrast. COMPARISON:  11/09/2017 FINDINGS: Lower chest: No acute findings. Hepatobiliary: No mass visualized on this unenhanced exam. Tiny calcified gallstones or sludge are seen, however there is no evidence of cholecystitis or biliary ductal dilatation. Pancreas: No mass or inflammatory process visualized on this unenhanced exam. Spleen:  Within normal limits in size. Adrenals/Urinary tract: Diffuse atrophy of native kidneys and multiple cysts are again seen, including small hyperdense hemorrhagic cyst in the lower pole the right kidney. No evidence of urolithiasis or hydronephrosis. Renal transplant is seen in the right iliac fossa which is unremarkable in appearance on this unenhanced exam. No evidence of hydronephrosis. Unremarkable unopacified urinary bladder. Stomach/Bowel: Severe diverticulosis is seen mainly  involving the sigmoid colon, however there is no evidence of diverticulitis. No evidence of obstruction, inflammatory process, or abnormal fluid collections. Vascular/Lymphatic: No evidence of retroperitoneal hemorrhage. No pathologically enlarged lymph nodes identified. 3.6 cm infrarenal abdominal aortic aneurysm is mildly enlarged from 3.3 cm previously. Reproductive:  No mass or other significant abnormality. Other:  None. Musculoskeletal: No suspicious bone lesions identified. Advanced spondylosis is again seen at L4-5  with grade 2 anterolisthesis. IMPRESSION: No evidence of retroperitoneal hemorrhage or other acute findings. 3.6 cm infrarenal abdominal aortic aneurysm, mildly increased in size from prior exam. Recommend followup by ultrasound in 2 years. This recommendation follows ACR consensus guidelines: White Paper of the ACR Incidental Findings Committee II on Vascular Findings. J Am Coll Radiol 2013; 10:789-794. Aortic aneurysm NOS (ICD10-I71.9) Normal appearance of renal transplant. No evidence of transplant hydronephrosis. Cholelithiasis.  No radiographic evidence of cholecystitis. Colonic diverticulosis, without radiographic evidence of diverticulitis. Electronically Signed   By: Marlaine Hind M.D.   On: 12/29/2018 10:32    Labs: BMET Recent Labs  Lab 12/26/18 1930 12/26/18 2115 12/27/18 0134 12/27/18 0829 12/27/18 1624 12/28/18 0324 12/29/18 1154 12/30/18 0442  NA 133* 136 135  --  137 135 140 138  K 6.6* 5.4* 5.1 5.0 5.1 4.3 3.8 3.7  CL 103 104 102  --  102 101 105 105  CO2 21* 21* 19*  --  22 24 25 24   GLUCOSE 280* 307* 251*  --  180* 151* 136* 119*  BUN 60* 58* 60*  --  62* 64* 59* 58*  CREATININE 4.31* 4.32* 4.32*  --  4.42* 4.33* 3.48* 3.29*  CALCIUM 9.1 9.4 9.0  --  8.9 9.0 8.7* 8.7*  PHOS  --   --   --   --  4.0  --  3.3  --    CBC Recent Labs  Lab 12/26/18 1930 12/27/18 0134  12/28/18 0324 12/28/18 1307 12/29/18 0509 12/30/18 0442  WBC 15.8* 10.7*  --  10.3  --   --  6.6  NEUTROABS 15.1*  --   --   --   --   --   --   HGB 8.1* 6.9*   < > 8.8* 8.2* 7.7* 7.7*  HCT 26.8* 22.1*   < > 26.0* 25.2* 23.7* 24.4*  MCV 100.8* 98.2  --  93.5  --   --  96.1  PLT 334 298  --  259  --   --  264   < > = values in this interval not displayed.    Medications:    . cycloSPORINE modified  50 mg Oral BID  . insulin aspart  0-5 Units Subcutaneous QHS  . insulin aspart  0-9 Units Subcutaneous TID WC  . linagliptin  5 mg Oral Daily  . predniSONE  5 mg Oral Daily  . sodium bicarbonate   1,300 mg Oral BID     Jannifer Hick MD [facility] Pager [phone]

## 2019-01-01 ENCOUNTER — Telehealth: Payer: Self-pay

## 2019-01-01 ENCOUNTER — Telehealth: Payer: Self-pay | Admitting: Family Medicine

## 2019-01-01 NOTE — Telephone Encounter (Signed)
LM to return call for TCM. 

## 2019-01-01 NOTE — Telephone Encounter (Signed)
Caller name: Dwyane Dee  Relation to pt: PT from [facility] Call back number: [phone]    Reason for call:  Verbal orders for PT 2x 4 1x 1.  As per PT level 1 drug interaction for atorvastatin (LIPITOR) 40 MG tablet interacting with NEORAL 25 MG, PT requesting a call back today, please advise

## 2019-01-02 ENCOUNTER — Telehealth: Payer: Self-pay

## 2019-01-02 NOTE — Telephone Encounter (Signed)
Ok with the combination as long as he is not having muscle cramps or aches.  Caleb Hicks. Jerline Pain, MD 01/02/2019 3:57 PM

## 2019-01-02 NOTE — Telephone Encounter (Signed)
LM for patient to return call for TCM. 

## 2019-01-02 NOTE — Telephone Encounter (Signed)
Gave an ok for verbal order.[place] called to inform drug interaction see below.

## 2019-01-03 NOTE — Telephone Encounter (Signed)
Notified. 

## 2019-01-06 ENCOUNTER — Telehealth: Payer: Self-pay | Admitting: Family Medicine

## 2019-01-06 NOTE — Telephone Encounter (Signed)
Home Health Verbal Orders - Caller/Agency: Donald/ Santina Evans Number: [phone] vm can be left Requesting OT Frequency: 1x a week for 4 weeks for ADL, IADL, TRANSFERS AND EXERCISE   FYI: Pt complained of Diarrhea today and no other symptoms

## 2019-01-07 NOTE — Telephone Encounter (Signed)
Left detailed message to Elenore Rota ok for verbal orders.

## 2019-01-10 ENCOUNTER — Telehealth: Payer: Self-pay | Admitting: Family Medicine

## 2019-01-10 NOTE — Telephone Encounter (Signed)
Copied from [place] [id number]. Topic: Quick Communication - Home Health Verbal Orders >> Jan 10, 2019  4:03 PM Erick Blinks wrote: Caller/Agency: Kandis Ban  Callback Number: [phone] Requesting OT/PT/Skilled Nursing/Social Work/Speech Therapy: indicating okay to receive orders from Dr. Donzetta Matters his vascular surgeon.  Pt had swelling in left arm and hand, after calling vascular surgeon they instructed pt to elevate hand and arm above heart They indicated that swelling was normal after left arm graft.  Requesting call back with verbal orders   Spoke with Stann Mainland. At Dr. [name] office   Plans to send order over unless PCP declines   Order that it is okay to receive orders from Dr. Donzetta Matters

## 2019-01-10 NOTE — Telephone Encounter (Signed)
See below

## 2019-01-13 NOTE — Telephone Encounter (Signed)
See other message.  Orders have already been given.

## 2019-01-16 ENCOUNTER — Telehealth (HOSPITAL_COMMUNITY): Payer: Self-pay | Admitting: Rehabilitation

## 2019-01-16 ENCOUNTER — Other Ambulatory Visit: Payer: Self-pay | Admitting: Family Medicine

## 2019-01-16 NOTE — Telephone Encounter (Signed)

## 2019-01-17 ENCOUNTER — Ambulatory Visit (INDEPENDENT_AMBULATORY_CARE_PROVIDER_SITE_OTHER): Payer: Self-pay | Admitting: Vascular Surgery

## 2019-01-17 ENCOUNTER — Encounter: Payer: Self-pay | Admitting: Vascular Surgery

## 2019-01-17 ENCOUNTER — Other Ambulatory Visit: Payer: Self-pay

## 2019-01-17 VITALS — BP 166/68 | HR 60 | Temp 97.6°F | Resp 20 | Ht 66.0 in | Wt 155.0 lb

## 2019-01-17 DIAGNOSIS — N184 Chronic kidney disease, stage 4 (severe): Secondary | ICD-10-CM

## 2019-01-17 NOTE — Progress Notes (Signed)
    Subjective:     Patient ID: ISHAM SMITHERMAN, male   DOB: 02-Oct-1940, 78 y.o.   MRN: [id number]  HPI 78 year old male underwent attempted second stage basilic vein fistula on the left on 12/26/2018.  Did have to have an placement of a interposition PTFE graft which had a very strong signal.  He was seen a few days later for left upper extremity swelling.  He now returns to the office for follow-up.  Still having some left arm swelling but not causing him much issue.  Not currently on dialysis.   Review of Systems Left arm swelling.    Objective:   Physical Exam  Vitals:   01/17/19 0821  BP: (!) 166/68  Pulse: 60  Resp: 20  Temp: 97.6 F (36.4 C)  SpO2: 99%   Left arm swelling is.  Incisional.  There is no sign of erythema or infection Left hand swelling Strong thrill left upper arm AV graft No palpable left radial pulse but hand is warm appears well-perfused     Assessment/plan     78 year old male status post above-noted procedures.  Here today for 3-week follow-up.  Patient not currently on dialysis would avoid fistulogram at this time.  He can follow-up on an as-needed basis.  There is a very strong thrill in the graft should be suitable for use by time he would need it.  Akyah Lagrange C. Donzetta Matters, MD [facility] Office: [phone] Pager: [phone]

## 2019-01-23 DIAGNOSIS — E538 Deficiency of other specified B group vitamins: Secondary | ICD-10-CM

## 2019-01-23 DIAGNOSIS — Z992 Dependence on renal dialysis: Secondary | ICD-10-CM

## 2019-01-23 DIAGNOSIS — E875 Hyperkalemia: Secondary | ICD-10-CM

## 2019-01-23 DIAGNOSIS — K5730 Diverticulosis of large intestine without perforation or abscess without bleeding: Secondary | ICD-10-CM

## 2019-01-23 DIAGNOSIS — Z85828 Personal history of other malignant neoplasm of skin: Secondary | ICD-10-CM

## 2019-01-23 DIAGNOSIS — E790 Hyperuricemia without signs of inflammatory arthritis and tophaceous disease: Secondary | ICD-10-CM

## 2019-01-23 DIAGNOSIS — E785 Hyperlipidemia, unspecified: Secondary | ICD-10-CM

## 2019-01-23 DIAGNOSIS — M4316 Spondylolisthesis, lumbar region: Secondary | ICD-10-CM

## 2019-01-23 DIAGNOSIS — Z940 Kidney transplant status: Secondary | ICD-10-CM

## 2019-01-23 DIAGNOSIS — D631 Anemia in chronic kidney disease: Secondary | ICD-10-CM | POA: Diagnosis not present

## 2019-01-23 DIAGNOSIS — I120 Hypertensive chronic kidney disease with stage 5 chronic kidney disease or end stage renal disease: Secondary | ICD-10-CM

## 2019-01-23 DIAGNOSIS — K7689 Other specified diseases of liver: Secondary | ICD-10-CM

## 2019-01-23 DIAGNOSIS — D509 Iron deficiency anemia, unspecified: Secondary | ICD-10-CM

## 2019-01-23 DIAGNOSIS — E872 Acidosis: Secondary | ICD-10-CM

## 2019-01-23 DIAGNOSIS — Z1159 Encounter for screening for other viral diseases: Secondary | ICD-10-CM

## 2019-01-23 DIAGNOSIS — M1990 Unspecified osteoarthritis, unspecified site: Secondary | ICD-10-CM

## 2019-01-23 DIAGNOSIS — E1122 Type 2 diabetes mellitus with diabetic chronic kidney disease: Secondary | ICD-10-CM | POA: Diagnosis not present

## 2019-01-23 DIAGNOSIS — D62 Acute posthemorrhagic anemia: Secondary | ICD-10-CM

## 2019-01-23 DIAGNOSIS — I714 Abdominal aortic aneurysm, without rupture: Secondary | ICD-10-CM

## 2019-01-23 DIAGNOSIS — M1030 Gout due to renal impairment, unspecified site: Secondary | ICD-10-CM

## 2019-01-23 DIAGNOSIS — N186 End stage renal disease: Secondary | ICD-10-CM

## 2019-01-23 DIAGNOSIS — M47816 Spondylosis without myelopathy or radiculopathy, lumbar region: Secondary | ICD-10-CM

## 2019-01-23 DIAGNOSIS — Z7982 Long term (current) use of aspirin: Secondary | ICD-10-CM

## 2019-01-23 DIAGNOSIS — Z87891 Personal history of nicotine dependence: Secondary | ICD-10-CM

## 2019-01-23 DIAGNOSIS — N028 Recurrent and persistent hematuria with other morphologic changes: Secondary | ICD-10-CM

## 2019-01-23 DIAGNOSIS — Z9181 History of falling: Secondary | ICD-10-CM

## 2019-01-23 DIAGNOSIS — Z7952 Long term (current) use of systemic steroids: Secondary | ICD-10-CM

## 2019-04-14 ENCOUNTER — Other Ambulatory Visit: Payer: Self-pay | Admitting: Family Medicine

## 2019-04-25 ENCOUNTER — Ambulatory Visit (INDEPENDENT_AMBULATORY_CARE_PROVIDER_SITE_OTHER): Payer: Medicare Other

## 2019-04-25 ENCOUNTER — Ambulatory Visit (INDEPENDENT_AMBULATORY_CARE_PROVIDER_SITE_OTHER): Payer: Medicare Other | Admitting: Family Medicine

## 2019-04-25 ENCOUNTER — Encounter: Payer: Self-pay | Admitting: Family Medicine

## 2019-04-25 ENCOUNTER — Other Ambulatory Visit: Payer: Self-pay

## 2019-04-25 VITALS — BP 122/62 | HR 62 | Temp 97.9°F | Ht 72.0 in | Wt 151.4 lb

## 2019-04-25 VITALS — BP 122/62 | Temp 97.9°F | Ht 72.0 in | Wt 151.4 lb

## 2019-04-25 DIAGNOSIS — Z13220 Encounter for screening for lipoid disorders: Secondary | ICD-10-CM

## 2019-04-25 DIAGNOSIS — Z0001 Encounter for general adult medical examination with abnormal findings: Secondary | ICD-10-CM

## 2019-04-25 DIAGNOSIS — N028 Recurrent and persistent hematuria with other morphologic changes: Secondary | ICD-10-CM

## 2019-04-25 DIAGNOSIS — I10 Essential (primary) hypertension: Secondary | ICD-10-CM

## 2019-04-25 DIAGNOSIS — N184 Chronic kidney disease, stage 4 (severe): Secondary | ICD-10-CM | POA: Diagnosis not present

## 2019-04-25 DIAGNOSIS — N185 Chronic kidney disease, stage 5: Secondary | ICD-10-CM

## 2019-04-25 DIAGNOSIS — Z0000 Encounter for general adult medical examination without abnormal findings: Secondary | ICD-10-CM | POA: Diagnosis not present

## 2019-04-25 DIAGNOSIS — E785 Hyperlipidemia, unspecified: Secondary | ICD-10-CM

## 2019-04-25 DIAGNOSIS — E790 Hyperuricemia without signs of inflammatory arthritis and tophaceous disease: Secondary | ICD-10-CM | POA: Diagnosis not present

## 2019-04-25 DIAGNOSIS — E1122 Type 2 diabetes mellitus with diabetic chronic kidney disease: Secondary | ICD-10-CM | POA: Diagnosis not present

## 2019-04-25 LAB — URIC ACID: Uric Acid, Serum: 8.5 mg/dL — ABNORMAL HIGH (ref 4.0–7.8)

## 2019-04-25 LAB — CBC
HCT: 34.5 % — ABNORMAL LOW (ref 39.0–52.0)
Hemoglobin: 11.3 g/dL — ABNORMAL LOW (ref 13.0–17.0)
MCHC: 32.7 g/dL (ref 30.0–36.0)
MCV: 92.6 fl (ref 78.0–100.0)
Platelets: 227 10*3/uL (ref 150.0–400.0)
RBC: 3.73 Mil/uL — ABNORMAL LOW (ref 4.22–5.81)
RDW: 15 % (ref 11.5–15.5)
WBC: 6.3 10*3/uL (ref 4.0–10.5)

## 2019-04-25 LAB — LIPID PANEL
Cholesterol: 166 mg/dL (ref 0–200)
HDL: 30.3 mg/dL — ABNORMAL LOW (ref >39.00)
LDL Cholesterol: 103 mg/dL — ABNORMAL HIGH (ref 0–99)
NonHDL: 135.92
Total CHOL/HDL Ratio: 5
Triglycerides: 165 mg/dL — ABNORMAL HIGH (ref 0.0–149.0)
VLDL: 33 mg/dL (ref 0.0–40.0)

## 2019-04-25 LAB — COMPREHENSIVE METABOLIC PANEL
ALT: 10 U/L (ref 0–53)
AST: 10 U/L (ref 0–37)
Albumin: 3.6 g/dL (ref 3.5–5.2)
Alkaline Phosphatase: 71 U/L (ref 39–117)
BUN: 65 mg/dL — ABNORMAL HIGH (ref 6–23)
CO2: 31 mEq/L (ref 19–32)
Calcium: 9.7 mg/dL (ref 8.4–10.5)
Chloride: 98 mEq/L (ref 96–112)
Creatinine, Ser: 3.52 mg/dL — ABNORMAL HIGH (ref 0.40–1.50)
GFR: 16.87 mL/min — ABNORMAL LOW (ref >60.00)
Glucose, Bld: 136 mg/dL — ABNORMAL HIGH (ref 70–99)
Potassium: 5.1 mEq/L (ref 3.5–5.1)
Sodium: 140 mEq/L (ref 135–145)
Total Bilirubin: 0.8 mg/dL (ref 0.2–1.2)
Total Protein: 6 g/dL (ref 6.0–8.3)

## 2019-04-25 LAB — TSH: TSH: 1.68 u[IU]/mL (ref 0.35–4.50)

## 2019-04-25 LAB — HEMOGLOBIN A1C: Hgb A1c MFr Bld: 6 % (ref 4.6–6.5)

## 2019-04-25 NOTE — Progress Notes (Signed)
Chief Complaint:  Caleb Hicks is a 78 y.o. male who presents today for his annual comprehensive physical exam.    Assessment/Plan:  DM2 (diabetes mellitus, type 2) (HCC) Continue Januvia 25 mg daily.  Check A1c today.  CKD (chronic kidney disease) stage 4, GFR 15-29 ml/min (HCC) Continue management per nephrology.  Will check C met today.  Hyperuricemia Check uric acid.  Patient is on allopurinol but does not remember the dose.  Asked him to check so that we can verify that it was appropriately renally dosed.  Hyperlipidemia Continue atorvastatin 40 mg daily.  Check lipid panel today.  HTN (hypertension) At goal.  Continue metoprolol 50 mg twice daily.  Check CBC, C met, TSH.  Preventative Healthcare: UTD on vaccines and screenings.   Patient Counseling(The following topics were reviewed and/or handout was given):  -Nutrition: Stressed importance of moderation in sodium/caffeine intake, saturated fat and cholesterol, caloric balance, sufficient intake of fresh fruits, vegetables, and fiber.  -Stressed the importance of regular exercise.   -Substance Abuse: Discussed cessation/primary prevention of tobacco, alcohol, or other drug use; driving or other dangerous activities under the influence; availability of treatment for abuse.   -Injury prevention: Discussed safety belts, safety helmets, smoke detector, smoking near bedding or upholstery.   -Sexuality: Discussed sexually transmitted diseases, partner selection, use of condoms, avoidance of unintended pregnancy and contraceptive alternatives.   -Dental health: Discussed importance of regular tooth brushing, flossing, and dental visits.  -Health maintenance and immunizations reviewed. Please refer to Health maintenance section.  Return to care in 1 year for next preventative visit.     Subjective:  HPI:  He has no acute complaints today.   His stable, chronic medical conditions are outlined below:   #Dyslipidemia - On  Lipitor 20 mg daily and tolerating well - ROS: No reported myalgias  # Hyperglycemia - On januvia 28m daily and tolerating well  #Hypertension - On metoprolol tartrate 50 mg twice daily.  Tolerating well. 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  Exercise: No specific exercises.   Depression screen PHQ 2/9 04/25/2019  Decreased Interest 0  Down, Depressed, Hopeless 0  PHQ - 2 Score 0    Health Maintenance Due  Topic Date Due  . FOOT EXAM  05/22/1951  . OPHTHALMOLOGY EXAM  05[birth year]22/1950  . URINE MICROALBUMIN  05/22/1950  . TETANUS/TDAP  05/23/1959  . PNA vac Low Risk Adult (1 of 2 - PCV13) 05/22/2005     ROS: Per HPI, otherwise a complete review of systems was negative.   PMH:  The following were reviewed and entered/updated in epic: Past Medical History:  Diagnosis Date  . AAA (abdominal aortic aneurysm) ([place]: 4 cm 03/24/2017   3.5X 3. 5 CM PER 10-6 ABDOMINAL CT FOLOOWED EVRY 2 YEARS   . [place]dominal [place] ([place]   . Acute kidney injury superimposed on CKD ([facility]: Stage 4 03/24/2017  . Anemia   . Arthritis   . Cancer ([facility]    skin cancer - basal cell  . Diverticulitis 03/2017   1 UNIT BLOOD GIVEN  . Gout   . Hepatic cyst: Solitary 03/24/2017  . Hepatitis    Hepatitis A  .  Hydrocele sac   . Hyperlipidemia 03/24/2017  . Hypertension   . Hyperuricemia 03/24/2017  . 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 Pre-diabetes    Pt states he has "high blood sugar" but is not diabetic or pre-diabetic. He states he hasn't had an A1C done. He does take Januvia daily.  . 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 DM2  (diabetes mellitus, type 2) ([place]) 12/26/2018  . CKD (chronic kidney disease) stage 4, GFR 15-29 ml/min (HCC) 04/24/2018  . Diverticulosis with recurrent diverticulitis 04/24/2018  . Iron deficiency anemia 03/25/2017  . Low vitamin B12 level 03/25/2017  . HTN (hypertension) 03/24/2017  . AAA [facility]): 4 cm - repeat 10/2019 03/24/2017  . IgA nephropathy 03/24/2017  . Hyperlipidemia 03/24/2017  . 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 Hyperuricemia 03/24/2017  . Hepatic cyst: Solitary 03/24/2017  . Renal cyst 03/24/2017  . 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 AV FISTULA PLACEMENT Left 11/12/2018   Procedure: BRACHIOBASILIC ARTERIOVENOUS (AV) FISTULA CREATION LEFT ARM;  Surgeon: Waynetta Sandy, MD;  Location: [place];  Service: Vascular;  Laterality: Left;  . AV FISTULA PLACEMENT Left 12/26/2018   Procedure: Insertion Of Arteriovenous (Av) Gore-Tex Graft Left Arm;  Surgeon: Waynetta Sandy, MD;  Location: [place];  Service: Vascular;  Laterality: Left;  . COLONOSCOPY    . CYST REMOVED FROM SPINE  YRS AGO  . HERNIA REPAIR     SEVERAL  . HYDROCELE EXCISION Bilateral 06/04/2017   Procedure: BILATERAL HYDROCELECTOMY ADULT;  Surgeon: Lucas Mallow, MD;  Location[facility];  Service: Urology;  Laterality: Bilateral;  . KIDNEY TRANSPLANT    . PILIODINAL CYST  40-50 YRS AGO    Family History  Problem Relation Age of Onset  . Heart attack Father   . AAA (abdominal aortic aneurysm) Brother   . Cerebral aneurysm Brother     Medications- reviewed and updated Current Outpatient Medications  Medication Sig Dispense Refill  . aspirin 325 MG EC tablet Take 325 mg by mouth daily.    Marland Kitchen atorvastatin (LIPITOR) 40 MG tablet Take 1 tablet (40 mg total) by mouth daily. 90 tablet 3  . calcitRIOL (ROCALTROL) 0.25 MCG capsule Take 1 capsule (0.25 mcg total) by mouth daily. (Patient  taking differently: Take 0.25 mcg by mouth every other day. ) 30 capsule 0  . calcium carbonate (OS-CAL - DOSED IN MG OF ELEMENTAL CALCIUM) 1250 (500 Ca) MG tablet Take 1-2 tablets by mouth See admin instructions. 2 tablets in the morning, and 1 tablet in the evening    . Ferrous Sulfate (IRON PO) Take 1 tablet by mouth 2 (two) times a day.    . furosemide (LASIX) 40 MG tablet Take 1 tablet (40 mg total) by mouth daily. 30 tablet   . JANUVIA 25 MG tablet TAKE 1 TABLET(25 MG) BY MOUTH DAILY 90 tablet 0  . MAGNESIUM-OXIDE 400 (241.3 Mg) MG tablet Take 1 tablet by mouth 2 (two) times a day.    . metoprolol tartrate (LOPRESSOR) 50 MG tablet Take 50 mg by mouth 2 (two) times daily.  0  . NEORAL 25 MG capsule Take 2 tablets by mouth 2 (two) times a day.    . predniSONE (DELTASONE) 5 MG tablet Take 5 mg by mouth daily.  0  . senna-docusate (SENOKOT-S) 8.6-50 MG tablet  Take 1 tablet by mouth at bedtime. (Patient taking differently: Take 1 tablet by mouth at bedtime as needed for moderate constipation. )    . sodium bicarbonate 650 MG tablet Take 1,300 mg by mouth 2 (two) times daily.     No current facility-administered medications for this visit.     Allergies-reviewed and updated Allergies  Allergen Reactions  . Norvasc [Amlodipine Besylate] Other (See Comments)    Weight gain  . Tape Other (See Comments)    Tears skin    Social History   Socioeconomic History  . Marital status: Divorced    Spouse name: Not on file  . Number of children: Not on file  . Years of education: Not on file  . Highest education level: Not on file  Occupational History  . Not on file  Social Needs  . Financial resource strain: Not on file  . Food insecurity    Worry: Not on file    Inability: Not on file  . Transportation needs    Medical: Not on file    Non-medical: Not on file  Tobacco Use  . Smoking status: Former Smoker    Packs/day: 2.00    Years: 20.00    Pack years: 40.00    Types: Cigarettes   . Smokeless tobacco: Never Used  . Tobacco comment: 1984  Substance and Sexual Activity  . Alcohol use: No    Frequency: Never  . Drug use: No  . Sexual activity: Not on file  Lifestyle  . Physical activity    Days per week: Not on file    Minutes per session: Not on file  . Stress: Not on file  Relationships  . Social Herbalist on phone: Not on file    Gets together: Not on file    Attends religious service: Not on file    Active member of club or organization: Not on file    Attends meetings of clubs or organizations: Not on file    Relationship status: Not on file  Other Topics Concern  . Not on file  Social History Narrative  . Not on file        Objective:  Physical Exam: BP 122/62 (BP Location: Right Arm, Patient Position: Sitting, Cuff Size: Normal)   Pulse 62   Temp 97.9 F (36.6 C)   Ht 6' (1.829 m)   Wt 151 lb 6.4 oz (68.7 kg)   SpO2 96%   BMI 20.53 kg/m   Body mass index is 20.53 kg/m. Wt Readings from Last 3 Encounters:  04/25/19 151 lb 6.4 oz (68.7 kg)  01/17/19 155 lb (70.3 kg)  12/30/18 158 lb 14.4 oz (72.1 kg)   Gen: NAD, resting comfortably HEENT: TMs normal bilaterally. OP clear. No thyromegaly noted.  CV: RRR with no murmurs appreciated Pulm: NWOB, CTAB with no crackles, wheezes, or rhonchi GI: Normal bowel sounds present. Soft, Nontender, Nondistended. MSK: no edema, cyanosis, or clubbing noted Skin: warm, dry Neuro: CN2-12 grossly intact. Strength 5/5 in upper and lower extremities. Reflexes symmetric and intact bilaterally.  Psych: Normal affect and thought content     Caleb M. Jerline Pain, MD 04/25/2019 3:10 PM

## 2019-04-25 NOTE — Assessment & Plan Note (Signed)
Check uric acid.  Patient is on allopurinol but does not remember the dose.  Asked him to check so that we can verify that it was appropriately renally dosed.

## 2019-04-25 NOTE — Assessment & Plan Note (Signed)
Continue Januvia 25 mg daily.  Check A1c today.

## 2019-04-25 NOTE — Progress Notes (Signed)
I have personally reviewed the Medicare Annual Wellness Visit and agree with the assessment and plan.  Algis Greenhouse. Jerline Pain, MD 04/25/2019 3:59 PM

## 2019-04-25 NOTE — Progress Notes (Signed)
Subjective:   Caleb Hicks is a 78 y.o. male who presents for an Initial Medicare Annual Wellness Visit.  Review of Systems   Cardiac Risk Factors include: advanced age (>49men, >66 women);diabetes mellitus;male gender;hypertension   Objective:    Today's Vitals   04/25/19 1525  BP: 122/62  Temp: 97.9 F (36.6 C)  Weight: 151 lb 6.4 oz (68.7 kg)  Height: 6' (1.829 m)   Body mass index is 20.53 kg/m.  Advanced Directives 04/25/2019 01/17/2019 12/26/2018 12/26/2018 11/08/2018 04/09/2018 11/07/2017  Does Patient Have a Medical Advance Directive? Yes No Yes No Yes No Yes  Type of Advance Directive Living will;Healthcare Power of Attorney - Living will - [place];Living will - [place];Living will  Does patient want to make changes to medical advance directive? No - Patient declined No - Patient declined No - Patient declined - No - Patient declined - No - Patient declined  Copy of [place] in Chart? No - copy requested - - - - - No - copy requested  Would patient like information on creating a medical advance directive? - No - Patient declined - - - No - Patient declined -    Current Medications (verified) Outpatient Encounter Medications as of 04/25/2019  Medication Sig  . aspirin 325 MG EC tablet Take 325 mg by mouth daily.  Marland Kitchen atorvastatin (LIPITOR) 40 MG tablet Take 1 tablet (40 mg total) by mouth daily.  . calcitRIOL (ROCALTROL) 0.25 MCG capsule Take 1 capsule (0.25 mcg total) by mouth daily. (Patient taking differently: Take 0.25 mcg by mouth every other day. )  . calcium carbonate (OS-CAL - DOSED IN MG OF ELEMENTAL CALCIUM) 1250 (500 Ca) MG tablet Take 1-2 tablets by mouth See admin instructions. 2 tablets in the morning, and 1 tablet in the evening  . Ferrous Sulfate (IRON PO) Take 1 tablet by mouth 2 (two) times a day.  . furosemide (LASIX) 40 MG tablet Take 1 tablet (40 mg total) by mouth daily.  Marland Kitchen JANUVIA 25 MG  tablet TAKE 1 TABLET(25 MG) BY MOUTH DAILY  . MAGNESIUM-OXIDE 400 (241.3 Mg) MG tablet Take 1 tablet by mouth 2 (two) times a day.  . metoprolol tartrate (LOPRESSOR) 50 MG tablet Take 50 mg by mouth 2 (two) times daily.  Marland Kitchen NEORAL 25 MG capsule Take 2 tablets by mouth 2 (two) times a day.  . predniSONE (DELTASONE) 5 MG tablet Take 5 mg by mouth daily.  Marland Kitchen senna-docusate (SENOKOT-S) 8.6-50 MG tablet Take 1 tablet by mouth at bedtime. (Patient taking differently: Take 1 tablet by mouth at bedtime as needed for moderate constipation. )  . sodium bicarbonate 650 MG tablet Take 1,300 mg by mouth 2 (two) times daily.  . [DISCONTINUED] oxyCODONE-acetaminophen (PERCOCET/ROXICET) 5-325 MG tablet Take 1 tablet by mouth every 6 (six) hours as needed.   No facility-administered encounter medications on file as of 04/25/2019.     Allergies (verified) Norvasc [amlodipine besylate] and Tape   History: Past Medical History:  Diagnosis Date  . AAA (abdominal aortic aneurysm) ([place]): 4 cm 03/24/2017   3.5X 3. 5 CM PER 10-6 ABDOMINAL CT FOLOOWED EVRY 2 YEARS   . Abdominal aneurysm ([place])   . Acute kidney injury superimposed on CKD [facility]): Stage 4 03/24/2017  . Anemia   . Arthritis   . Cancer ([place])    skin cancer - basal cell  . Diverticulitis 03/2017   1 UNIT BLOOD GIVEN  . Gout   .  Hepatic cyst: Solitary 03/24/2017  . Hepatitis    Hepatitis A  . Hydrocele sac   . Hyperlipidemia 03/24/2017  . Hypertension   . Hyperuricemia 03/24/2017  . 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 Pre-diabetes    Pt states he has "high blood sugar" but is not diabetic or pre-diabetic. He states he hasn't had an A1C done. He does take Januvia daily.  . 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 AV FISTULA PLACEMENT Left 11/12/2018   Procedure: BRACHIOBASILIC ARTERIOVENOUS (AV) FISTULA CREATION LEFT ARM;  Surgeon: Waynetta Sandy, MD;  Location: [place];   Service: Vascular;  Laterality: Left;  . AV FISTULA PLACEMENT Left 12/26/2018   Procedure: Insertion Of Arteriovenous (Av) Gore-Tex Graft Left Arm;  Surgeon: Waynetta Sandy, MD;  Location: [place];  Service: Vascular;  Laterality: Left;  . COLONOSCOPY    . CYST REMOVED FROM SPINE  YRS AGO  . HERNIA REPAIR     SEVERAL  . HYDROCELE EXCISION Bilateral 06/04/2017   Procedure: BILATERAL HYDROCELECTOMY ADULT;  Surgeon: Lucas Mallow, MD;  Location[facility];  Service: Urology;  Laterality: Bilateral;  . KIDNEY TRANSPLANT    . PILIODINAL CYST  40-50 YRS AGO   Family History  Problem Relation Age of Onset  . Heart attack Father   . AAA (abdominal aortic aneurysm) Brother   . Cerebral aneurysm Brother    Social History   Socioeconomic History  . Marital status: Divorced    Spouse name: Not on file  . Number of children: Not on file  . Years of education: Not on file  . Highest education level: Not on file  Occupational History  . Not on file  Social Needs  . Financial resource strain: Not on file  . Food insecurity    Worry: Not on file    Inability: Not on file  . Transportation needs    Medical: Not on file    Non-medical: Not on file  Tobacco Use  . Smoking status: Former Smoker    Packs/day: 2.00    Years: 20.00    Pack years: 40.00    Types: Cigarettes  . Smokeless tobacco: Never Used  . Tobacco comment: 1984  Substance and Sexual Activity  . Alcohol use: No    Frequency: Never  . Drug use: No  . Sexual activity: Not on file  Lifestyle  . Physical activity    Days per week: Not on file    Minutes per session: Not on file  . Stress: Not on file  Relationships  . Social Herbalist on phone: Not on file    Gets together: Not on file    Attends religious service: Not on file    Active member of club or organization: Not on file    Attends meetings of clubs or organizations: Not on file    Relationship status: Not on file   Other Topics Concern  . Not on file  Social History Narrative  . Not on file   Tobacco Counseling Counseling given: Not Answered Comment: 1984   Clinical Intake:  Pre-visit preparation completed: No  Pain : No/denies pain  Diabetes: Yes CBG done?: No Did pt. bring in CBG monitor from home?: No  How often do you need to have someone help you when you read instructions, pamphlets, or other written materials from your doctor or pharmacy?: 1 - Never  Interpreter Needed?: No  Information entered by :: Denman George LPN  Activities of Daily Living In your present state of health, do you have any difficulty performing the following activities: 04/25/2019 12/26/2018  Hearing? Y N  Vision? N N  Difficulty concentrating or making decisions? N N  Walking or climbing stairs? Y Y  Dressing or bathing? N N  Doing errands, shopping? N Y  Conservation officer, nature and eating ? N -  Using the Toilet? N -  In the past six months, have you accidently leaked urine? Y -  Do you have problems with loss of bowel control? N -  Managing your Medications? N -  Managing your Finances? N -  Housekeeping or managing your Housekeeping? N -  Some recent data might be hidden     Immunizations and Health Maintenance Immunization History  Administered Date(s) Administered  . Fluad Quad(high Dose 65+) 04/09/2019   Health Maintenance Due  Topic Date Due  . FOOT EXAM  05/22/1951  . OPHTHALMOLOGY EXAM  05/22/1951  . URINE MICROALBUMIN  05/22/1950  . TETANUS/TDAP  05/23/1959  . PNA vac Low Risk Adult (1 of 2 - PCV13) 05/22/2005    Patient Care Team: [name], MD as PCP - General (Family Medicine) Clent Jacks, MD as Consulting Physician (Nephrology) Waynetta Sandy, MD as Consulting Physician (Vascular Surgery)  Indicate any recent Medical Services you may have received from other than [facility] providers in the past year (date may be approximate).    Assessment:   This is a routine  wellness examination for Caleb Hicks.  Hearing/Vision screen No exam data present  Dietary issues and exercise activities discussed: Current Exercise Habits: The patient does not participate in regular exercise at present  Goals   None    Depression Screen PHQ 2/9 Scores 04/25/2019 04/24/2018  PHQ - 2 Score 0 0    Fall Risk Fall Risk  04/25/2019 04/24/2018  Falls in the past year? 0 0  Injury with Fall? 0 -  Risk for fall due to : History of fall(s) -  Follow up Falls evaluation completed;Education provided;Falls prevention discussed -    Is the patient's home free of loose throw rugs in walkways, pet beds, electrical cords, etc?   yes      Grab bars in the bathroom? yes      Handrails on the stairs?   yes      Adequate lighting?   yes  Timed Get Up and Go performed: completed and within normal timeframe; no gait abnormalities noted   Cognitive Function: no cognitive concerns noted at this time      6CIT Screen 04/25/2019  What Year? 0 points  What month? 0 points  What time? 0 points  Count back from 20 0 points  Months in reverse 0 points  Repeat phrase 2 points  Total Score 2    Screening Tests Health Maintenance  Topic Date Due  . FOOT EXAM  05/22/1951  . OPHTHALMOLOGY EXAM  05/22/1951  . URINE MICROALBUMIN  05[birth year]22/1950  . TETANUS/TDAP  05/23/1959  . PNA vac Low Risk Adult (1 of 2 - PCV13) 05/22/2005  . HEMOGLOBIN A1C  06/28/2019  . INFLUENZA VACCINE  Completed    Qualifies for Shingles Vaccine? Discussed and patient will check with pharmacy for coverage.  Patient education handout provided   Cancer Screenings: Lung: Low Dose CT Chest recommended if Age 7-80 years, 30 pack-year currently smoking OR have quit w/in 15years. Patient does not qualify. Colorectal:  No longer indicated   Plan:  I have personally reviewed and addressed the Medicare Annual Wellness questionnaire and have noted the following in the patient's chart:  A. Medical and social history B. Use  of alcohol, tobacco or illicit drugs  C. Current medications and supplements D. Functional ability and status E.  Nutritional status F.  Physical activity G. Advance directives H. List of other physicians I.  Hospitalizations, surgeries, and ER visits in previous 12 months J.  [place] such as hearing and vision if needed, cognitive and depression L. Referrals, records requested, and appointments- none   In addition, I have reviewed and discussed with patient certain preventive protocols, quality metrics, and best practice recommendations. A written personalized care plan for preventive services as well as general preventive health recommendations were provided to patient.   Signed,  Denman George, LPN  Nurse Health Advisor   Nurse Notes: no additional

## 2019-04-25 NOTE — Assessment & Plan Note (Signed)
At goal.  Continue metoprolol 50 mg twice daily.  Check CBC, C met, TSH.

## 2019-04-25 NOTE — Assessment & Plan Note (Signed)
Continue management per nephrology.  Will check C met today.

## 2019-04-25 NOTE — Patient Instructions (Signed)
Caleb Hicks , Thank you for taking time to come for your Medicare Wellness Visit. I appreciate your ongoing commitment to your health goals. Please review the following plan we discussed and let me know if I can assist you in the future.   Screening recommendations/referrals: Colorectal Screening: No longer indcated  Vision and Dental Exams: Recommended annual ophthalmology exams for early detection of glaucoma and other disorders of the eye Recommended annual dental exams for proper oral hygiene  Diabetic Exams: Diabetic Eye Exam: recommended yearly Diabetic Foot Exam: recommended yearly   Vaccinations: Influenza vaccine: completed 04/09/19 Pneumococcal vaccine: recommended  Tdap vaccine: recommended; Please call your insurance company to determine your out of pocket expense. You may receive this vaccine at your local pharmacy or Health Dept. Shingles vaccine: Please call your insurance company to determine your out of pocket expense for the Shingrix vaccine. You may receive this vaccine at your local pharmacy.  Advanced directives: Please bring a copy of your POA (Power of Attorney) and/or Living Will to your next appointment.  Goals: Recommend to drink at least 6-8 8oz glasses of water per day and consume a balanced diet rich in fresh fruits and vegetables.   Next appointment: Please schedule your Annual Wellness Visit with your Nurse Health Advisor in one year.  Preventive Care 79 Years and Older, Male Preventive care refers to lifestyle choices and visits with your health care provider that can promote health and wellness. What does preventive care include?  A yearly physical exam. This is also called an annual well check.  Dental exams once or twice a year.  Routine eye exams. Ask your health care provider how often you should have your eyes checked.  Personal lifestyle choices, including:  Daily care of your teeth and gums.  Regular physical activity.  Eating a  healthy diet.  Avoiding tobacco and drug use.  Limiting alcohol use.  Practicing safe sex.  Taking low doses of aspirin every day if recommended by your health care provider..  Taking vitamin and mineral supplements as recommended by your health care provider. What happens during an annual well check? The services and screenings done by your health care provider during your annual well check will depend on your age, overall health, lifestyle risk factors, and family history of disease. Counseling  Your health care provider may ask you questions about your:  Alcohol use.  Tobacco use.  Drug use.  Emotional well-being.  Home and relationship well-being.  Sexual activity.  Eating habits.  History of falls.  Memory and ability to understand (cognition).  Work and work Statistician. Screening  You may have the following tests or measurements:  Height, weight, and BMI.  Blood pressure.  Lipid and cholesterol levels. These may be checked every 5 years, or more frequently if you are over 42 years old.  Skin check.  Lung cancer screening. You may have this screening every year starting at age 88 if you have a 30-pack-year history of smoking and currently smoke or have quit within the past 15 years.  Fecal occult blood test (FOBT) of the stool. You may have this test every year starting at age 77.  Flexible sigmoidoscopy or colonoscopy. You may have a sigmoidoscopy every 5 years or a colonoscopy every 10 years starting at age 94.  Prostate cancer screening. Recommendations will vary depending on your family history and other risks.  Hepatitis C blood test.  Hepatitis B blood test.  Sexually transmitted disease (STD) testing.  Diabetes screening. This is  done by checking your blood sugar (glucose) after you have not eaten for a while (fasting). You may have this done every 1-3 years.  Abdominal aortic aneurysm (AAA) screening. You may need this if you are a current  or former smoker.  Osteoporosis. You may be screened starting at age 73 if you are at high risk. Talk with your health care provider about your test results, treatment options, and if necessary, the need for more tests. Vaccines  Your health care provider may recommend certain vaccines, such as:  Influenza vaccine. This is recommended every year.  Tetanus, diphtheria, and acellular pertussis (Tdap, Td) vaccine. You may need a Td booster every 10 years.  Zoster vaccine. You may need this after age 37.  Pneumococcal 13-valent conjugate (PCV13) vaccine. One dose is recommended after age 29.  Pneumococcal polysaccharide (PPSV23) vaccine. One dose is recommended after age 73. Talk to your health care provider about which screenings and vaccines you need and how often you need them. This information is not intended to replace advice given to you by your health care provider. Make sure you discuss any questions you have with your health care provider. Document Released: 07/02/2015 Document Revised: 02/23/2016 Document Reviewed: 04/06/2015 Elsevier Interactive Patient Education  2017 [employer] Prevention in the Home Falls can cause injuries. They can happen to people of all ages. There are many things you can do to make your home safe and to help prevent falls. What can I do on the outside of my home?  Regularly fix the edges of walkways and driveways and fix any cracks.  Remove anything that might make you trip as you walk through a door, such as a raised step or threshold.  Trim any bushes or trees on the path to your home.  Use bright outdoor lighting.  Clear any walking paths of anything that might make someone trip, such as rocks or tools.  Regularly check to see if handrails are loose or broken. Make sure that both sides of any steps have handrails.  Any raised decks and porches should have guardrails on the edges.  Have any leaves, snow, or ice cleared  regularly.  Use sand or salt on walking paths during winter.  Clean up any spills in your garage right away. This includes oil or grease spills. What can I do in the bathroom?  Use night lights.  Install grab bars by the toilet and in the tub and shower. Do not use towel bars as grab bars.  Use non-skid mats or decals in the tub or shower.  If you need to sit down in the shower, use a plastic, non-slip stool.  Keep the floor dry. Clean up any water that spills on the floor as soon as it happens.  Remove soap buildup in the tub or shower regularly.  Attach bath mats securely with double-sided non-slip rug tape.  Do not have throw rugs and other things on the floor that can make you trip. What can I do in the bedroom?  Use night lights.  Make sure that you have a light by your bed that is easy to reach.  Do not use any sheets or blankets that are too big for your bed. They should not hang down onto the floor.  Have a firm chair that has side arms. You can use this for support while you get dressed.  Do not have throw rugs and other things on the floor that can make you trip. What  can I do in the kitchen?  Clean up any spills right away.  Avoid walking on wet floors.  Keep items that you use a lot in easy-to-reach places.  If you need to reach something above you, use a strong step stool that has a grab bar.  Keep electrical cords out of the way.  Do not use floor polish or wax that makes floors slippery. If you must use wax, use non-skid floor wax.  Do not have throw rugs and other things on the floor that can make you trip. What can I do with my stairs?  Do not leave any items on the stairs.  Make sure that there are handrails on both sides of the stairs and use them. Fix handrails that are broken or loose. Make sure that handrails are as long as the stairways.  Check any carpeting to make sure that it is firmly attached to the stairs. Fix any carpet that is loose  or worn.  Avoid having throw rugs at the top or bottom of the stairs. If you do have throw rugs, attach them to the floor with carpet tape.  Make sure that you have a light switch at the top of the stairs and the bottom of the stairs. If you do not have them, ask someone to add them for you. What else can I do to help prevent falls?  Wear shoes that:  Do not have high heels.  Have rubber bottoms.  Are comfortable and fit you well.  Are closed at the toe. Do not wear sandals.  If you use a stepladder:  Make sure that it is fully opened. Do not climb a closed stepladder.  Make sure that both sides of the stepladder are locked into place.  Ask someone to hold it for you, if possible.  Clearly mark and make sure that you can see:  Any grab bars or handrails.  First and last steps.  Where the edge of each step is.  Use tools that help you move around (mobility aids) if they are needed. These include:  Canes.  Walkers.  Scooters.  Crutches.  Turn on the lights when you go into a dark area. Replace any light bulbs as soon as they burn out.  Set up your furniture so you have a clear path. Avoid moving your furniture around.  If any of your floors are uneven, fix them.  If there are any pets around you, be aware of where they are.  Review your medicines with your doctor. Some medicines can make you feel dizzy. This can increase your chance of falling. Ask your doctor what other things that you can do to help prevent falls. This information is not intended to replace advice given to you by your health care provider. Make sure you discuss any questions you have with your health care provider. Document Released: 04/01/2009 Document Revised: 11/11/2015 Document Reviewed: 07/10/2014 Elsevier Interactive Patient Education  2017 [employer].

## 2019-04-25 NOTE — Assessment & Plan Note (Signed)
Continue atorvastatin 40 mg daily.  Check lipid panel today.

## 2019-04-25 NOTE — Patient Instructions (Signed)
It was very nice to see you today!  We will check blood work today.  Come back in 1 year or sooner if needed.   Take care, Dr Jerline Pain  Please try these tips to maintain a healthy lifestyle:   Eat at least 3 REAL meals and 1-2 snacks per day.  Aim for no more than 5 hours between eating.  If you eat breakfast, please do so within one hour of getting up.    Obtain twice as many fruits/vegetables as protein or carbohydrate foods for both lunch and dinner. (Half of each meal should be fruits/vegetables, one quarter protein, and one quarter starchy carbs)   Cut down on sweet beverages. This includes juice, soda, and sweet tea.    Exercise at least 150 minutes every week.    Preventive Care 16 Years and Older, Male Preventive care refers to lifestyle choices and visits with your health care provider that can promote health and wellness. This includes:  A yearly physical exam. This is also called an annual well check.  Regular dental and eye exams.  Immunizations.  Screening for certain conditions.  Healthy lifestyle choices, such as diet and exercise. What can I expect for my preventive care visit? Physical exam Your health care provider will check:  Height and weight. These may be used to calculate body mass index (BMI), which is a measurement that tells if you are at a healthy weight.  Heart rate and blood pressure.  Your skin for abnormal spots. Counseling Your health care provider may ask you questions about:  Alcohol, tobacco, and drug use.  Emotional well-being.  Home and relationship well-being.  Sexual activity.  Eating habits.  History of falls.  Memory and ability to understand (cognition).  Work and work Statistician. What immunizations do I need?  Influenza (flu) vaccine  This is recommended every year. Tetanus, diphtheria, and pertussis (Tdap) vaccine  You may need a Td booster every 10 years. Varicella (chickenpox) vaccine  You may need  this vaccine if you have not already been vaccinated. Zoster (shingles) vaccine  You may need this after age 36. Pneumococcal conjugate (PCV13) vaccine  One dose is recommended after age 11. Pneumococcal polysaccharide (PPSV23) vaccine  One dose is recommended after age 83. Measles, mumps, and rubella (MMR) vaccine  You may need at least one dose of MMR if you were born in 1957 or later. You may also need a second dose. Meningococcal conjugate (MenACWY) vaccine  You may need this if you have certain conditions. Hepatitis A vaccine  You may need this if you have certain conditions or if you travel or work in places where you may be exposed to hepatitis A. Hepatitis B vaccine  You may need this if you have certain conditions or if you travel or work in places where you may be exposed to hepatitis B. Haemophilus influenzae type b (Hib) vaccine  You may need this if you have certain conditions. You may receive vaccines as individual doses or as more than one vaccine together in one shot (combination vaccines). Talk with your health care provider about the risks and benefits of combination vaccines. What tests do I need? Blood tests  Lipid and cholesterol levels. These may be checked every 5 years, or more frequently depending on your overall health.  Hepatitis C test.  Hepatitis B test. Screening  Lung cancer screening. You may have this screening every year starting at age 78 if you have a 30-pack-year history of smoking and currently smoke  or have quit within the past 15 years.  Colorectal cancer screening. All adults should have this screening starting at age 81 and continuing until age 38. Your health care provider may recommend screening at age 56 if you are at increased risk. You will have tests every 1-10 years, depending on your results and the type of screening test.  Prostate cancer screening. Recommendations will vary depending on your family history and other risks.   Diabetes screening. This is done by checking your blood sugar (glucose) after you have not eaten for a while (fasting). You may have this done every 1-3 years.  Abdominal aortic aneurysm (AAA) screening. You may need this if you are a current or former smoker.  Sexually transmitted disease (STD) testing. Follow these instructions at home: Eating and drinking  Eat a diet that includes fresh fruits and vegetables, whole grains, lean protein, and low-fat dairy products. Limit your intake of foods with high amounts of sugar, saturated fats, and salt.  Take vitamin and mineral supplements as recommended by your health care provider.  Do not drink alcohol if your health care provider tells you not to drink.  If you drink alcohol: ? Limit how much you have to 0-2 drinks a day. ? Be aware of how much alcohol is in your drink. In the [place] one drink equals one 12 oz bottle of beer (355 mL), one 5 oz glass of wine (148 mL), or one 1 oz glass of hard liquor (44 mL). Lifestyle  Take daily care of your teeth and gums.  Stay active. Exercise for at least 30 minutes on 5 or more days each week.  Do not use any products that contain nicotine or tobacco, such as cigarettes, e-cigarettes, and chewing tobacco. If you need help quitting, ask your health care provider.  If you are sexually active, practice safe sex. Use a condom or other form of protection to prevent STIs (sexually transmitted infections).  Talk with your health care provider about taking a low-dose aspirin or statin. What's next?  Visit your health care provider once a year for a well check visit.  Ask your health care provider how often you should have your eyes and teeth checked.  Stay up to date on all vaccines. This information is not intended to replace advice given to you by your health care provider. Make sure you discuss any questions you have with your health care provider. Document Released: 07/02/2015 Document Revised:  05/30/2018 Document Reviewed: 05/30/2018 Elsevier Patient Education  2020 [employer].

## 2019-04-29 NOTE — Progress Notes (Signed)
Please inform patient of the following:  Cholesterol levels improved from last year. Would like for him to stay on lipitor 20mg  daily Blood sugar level is stable. Would like for him to conitnue [place] 25mg  daily All of his other labs are stable including blood counts and kidney function   Would like for him to keep up the good work and we can recheck in a year or so.  Algis Greenhouse. Jerline Pain, MD 04/29/2019 2:28 PM

## 2019-05-23 ENCOUNTER — Other Ambulatory Visit: Payer: Self-pay

## 2019-05-23 ENCOUNTER — Inpatient Hospital Stay (HOSPITAL_COMMUNITY)
Admission: EM | Admit: 2019-05-23 | Discharge: 2019-05-27 | DRG: 377 | Disposition: A | Discharge status: Home / Self Care | Payer: Medicare Other | Attending: Internal Medicine | Admitting: Internal Medicine

## 2019-05-23 ENCOUNTER — Encounter (HOSPITAL_COMMUNITY): Payer: Self-pay

## 2019-05-23 ENCOUNTER — Emergency Department (HOSPITAL_COMMUNITY): Payer: Medicare Other

## 2019-05-23 ENCOUNTER — Ambulatory Visit: Payer: Self-pay | Admitting: *Deleted

## 2019-05-23 DIAGNOSIS — Z87891 Personal history of nicotine dependence: Secondary | ICD-10-CM | POA: Diagnosis not present

## 2019-05-23 DIAGNOSIS — Z66 Do not resuscitate: Secondary | ICD-10-CM | POA: Diagnosis present

## 2019-05-23 DIAGNOSIS — N186 End stage renal disease: Secondary | ICD-10-CM | POA: Diagnosis present

## 2019-05-23 DIAGNOSIS — I120 Hypertensive chronic kidney disease with stage 5 chronic kidney disease or end stage renal disease: Secondary | ICD-10-CM | POA: Diagnosis present

## 2019-05-23 DIAGNOSIS — Z8249 Family history of ischemic heart disease and other diseases of the circulatory system: Secondary | ICD-10-CM | POA: Diagnosis not present

## 2019-05-23 DIAGNOSIS — Z20828 Contact with and (suspected) exposure to other viral communicable diseases: Secondary | ICD-10-CM | POA: Diagnosis present

## 2019-05-23 DIAGNOSIS — K635 Polyp of colon: Secondary | ICD-10-CM | POA: Diagnosis present

## 2019-05-23 DIAGNOSIS — K633 Ulcer of intestine: Secondary | ICD-10-CM | POA: Diagnosis present

## 2019-05-23 DIAGNOSIS — Z7982 Long term (current) use of aspirin: Secondary | ICD-10-CM | POA: Diagnosis not present

## 2019-05-23 DIAGNOSIS — T8612 Kidney transplant failure: Secondary | ICD-10-CM | POA: Diagnosis present

## 2019-05-23 DIAGNOSIS — K5731 Diverticulosis of large intestine without perforation or abscess with bleeding: Secondary | ICD-10-CM

## 2019-05-23 DIAGNOSIS — K922 Gastrointestinal hemorrhage, unspecified: Secondary | ICD-10-CM

## 2019-05-23 DIAGNOSIS — D62 Acute posthemorrhagic anemia: Secondary | ICD-10-CM | POA: Diagnosis not present

## 2019-05-23 DIAGNOSIS — K640 First degree hemorrhoids: Secondary | ICD-10-CM | POA: Diagnosis present

## 2019-05-23 DIAGNOSIS — K5733 Diverticulitis of large intestine without perforation or abscess with bleeding: Secondary | ICD-10-CM | POA: Diagnosis present

## 2019-05-23 DIAGNOSIS — D696 Thrombocytopenia, unspecified: Secondary | ICD-10-CM | POA: Diagnosis present

## 2019-05-23 DIAGNOSIS — M109 Gout, unspecified: Secondary | ICD-10-CM | POA: Diagnosis present

## 2019-05-23 DIAGNOSIS — E785 Hyperlipidemia, unspecified: Secondary | ICD-10-CM | POA: Diagnosis present

## 2019-05-23 DIAGNOSIS — Y830 Surgical operation with transplant of whole organ as the cause of abnormal reaction of the patient, or of later complication, without mention of misadventure at the time of the procedure: Secondary | ICD-10-CM | POA: Diagnosis present

## 2019-05-23 DIAGNOSIS — Z85828 Personal history of other malignant neoplasm of skin: Secondary | ICD-10-CM

## 2019-05-23 DIAGNOSIS — D631 Anemia in chronic kidney disease: Secondary | ICD-10-CM | POA: Diagnosis present

## 2019-05-23 DIAGNOSIS — Z7952 Long term (current) use of systemic steroids: Secondary | ICD-10-CM | POA: Diagnosis not present

## 2019-05-23 DIAGNOSIS — I714 Abdominal aortic aneurysm, without rupture: Secondary | ICD-10-CM | POA: Diagnosis present

## 2019-05-23 DIAGNOSIS — K625 Hemorrhage of anus and rectum: Secondary | ICD-10-CM | POA: Diagnosis present

## 2019-05-23 DIAGNOSIS — K91840 Postprocedural hemorrhage and hematoma of a digestive system organ or structure following a digestive system procedure: Secondary | ICD-10-CM | POA: Diagnosis not present

## 2019-05-23 DIAGNOSIS — Z79899 Other long term (current) drug therapy: Secondary | ICD-10-CM | POA: Diagnosis not present

## 2019-05-23 DIAGNOSIS — D125 Benign neoplasm of sigmoid colon: Secondary | ICD-10-CM

## 2019-05-23 LAB — CBC
HCT: 36.1 % — ABNORMAL LOW (ref 39.0–52.0)
Hemoglobin: 10.7 g/dL — ABNORMAL LOW (ref 13.0–17.0)
MCH: 30.8 pg (ref 26.0–34.0)
MCHC: 29.6 g/dL — ABNORMAL LOW (ref 30.0–36.0)
MCV: 104 fL — ABNORMAL HIGH (ref 80.0–100.0)
Platelets: 136 10*3/uL — ABNORMAL LOW (ref 150–400)
RBC: 3.47 MIL/uL — ABNORMAL LOW (ref 4.22–5.81)
RDW: 14.7 % (ref 11.5–15.5)
WBC: 10.3 10*3/uL (ref 4.0–10.5)
nRBC: 0 % (ref 0.0–0.2)

## 2019-05-23 LAB — COMPREHENSIVE METABOLIC PANEL
ALT: 8 U/L (ref 0–44)
AST: 10 U/L — ABNORMAL LOW (ref 15–41)
Albumin: 2.8 g/dL — ABNORMAL LOW (ref 3.5–5.0)
Alkaline Phosphatase: 55 U/L (ref 38–126)
Anion gap: 11 (ref 5–15)
BUN: 54 mg/dL — ABNORMAL HIGH (ref 8–23)
CO2: 26 mmol/L (ref 22–32)
Calcium: 9.6 mg/dL (ref 8.9–10.3)
Chloride: 104 mmol/L (ref 98–111)
Creatinine, Ser: 3.12 mg/dL — ABNORMAL HIGH (ref 0.61–1.24)
GFR calc Af Amer: 21 mL/min — ABNORMAL LOW (ref >60)
GFR calc non Af Amer: 18 mL/min — ABNORMAL LOW (ref >60)
Glucose, Bld: 117 mg/dL — ABNORMAL HIGH (ref 70–99)
Potassium: 4.5 mmol/L (ref 3.5–5.1)
Sodium: 141 mmol/L (ref 135–145)
Total Bilirubin: 0.7 mg/dL (ref 0.3–1.2)
Total Protein: 5.4 g/dL — ABNORMAL LOW (ref 6.5–8.1)

## 2019-05-23 LAB — HEMATOCRIT: HCT: 33.9 % — ABNORMAL LOW (ref 39.0–52.0)

## 2019-05-23 LAB — POC OCCULT BLOOD, ED: Fecal Occult Bld: POSITIVE — AB

## 2019-05-23 LAB — SARS CORONAVIRUS 2 (TAT 6-24 HRS): SARS Coronavirus 2: NEGATIVE

## 2019-05-23 LAB — HEMOGLOBIN: Hemoglobin: 10.3 g/dL — ABNORMAL LOW (ref 13.0–17.0)

## 2019-05-23 MED ORDER — PANTOPRAZOLE SODIUM 40 MG IV SOLR
40.0000 mg | Freq: Two times a day (BID) | INTRAVENOUS | Status: DC
  Administered 2019-05-23 – 2019-05-27 (×8): 40 mg via INTRAVENOUS
  Filled 2019-05-23 (×8): qty 40

## 2019-05-23 MED ORDER — METOPROLOL TARTRATE 50 MG PO TABS
50.0000 mg | ORAL_TABLET | Freq: Two times a day (BID) | ORAL | Status: DC
  Administered 2019-05-24 – 2019-05-27 (×7): 50 mg via ORAL
  Filled 2019-05-23 (×5): qty 1
  Filled 2019-05-23: qty 2
  Filled 2019-05-23: qty 1
  Filled 2019-05-23: qty 2

## 2019-05-23 MED ORDER — PEG-KCL-NACL-NASULF-NA ASC-C 100 G PO SOLR
0.5000 | Freq: Once | ORAL | Status: AC
  Administered 2019-05-24: 06:00:00 100 g via ORAL
  Filled 2019-05-23: qty 1

## 2019-05-23 MED ORDER — ALLOPURINOL 100 MG PO TABS
200.0000 mg | ORAL_TABLET | Freq: Every day | ORAL | Status: DC
  Administered 2019-05-23 – 2019-05-27 (×4): 200 mg via ORAL
  Filled 2019-05-23 (×4): qty 2

## 2019-05-23 MED ORDER — PEG-KCL-NACL-NASULF-NA ASC-C 100 G PO SOLR: 1.0000 | Freq: Once | ORAL | Status: DC

## 2019-05-23 MED ORDER — ATORVASTATIN CALCIUM 40 MG PO TABS
40.0000 mg | ORAL_TABLET | Freq: Every day | ORAL | Status: DC
  Administered 2019-05-23 – 2019-05-27 (×5): 40 mg via ORAL
  Filled 2019-05-23 (×5): qty 1

## 2019-05-23 MED ORDER — PEG-KCL-NACL-NASULF-NA ASC-C 100 G PO SOLR
0.5000 | Freq: Once | ORAL | Status: AC
  Administered 2019-05-23: 23:00:00 100 g via ORAL
  Filled 2019-05-23: qty 1

## 2019-05-23 MED ORDER — SODIUM BICARBONATE 650 MG PO TABS
1300.0000 mg | ORAL_TABLET | Freq: Two times a day (BID) | ORAL | Status: DC
  Administered 2019-05-23 – 2019-05-27 (×8): 1300 mg via ORAL
  Filled 2019-05-23 (×8): qty 2

## 2019-05-23 MED ORDER — CALCITRIOL 0.25 MCG PO CAPS
0.2500 ug | ORAL_CAPSULE | Freq: Every day | ORAL | Status: DC
  Administered 2019-05-23 – 2019-05-27 (×5): 0.25 ug via ORAL
  Filled 2019-05-23 (×5): qty 1

## 2019-05-23 MED ORDER — PREDNISONE 5 MG PO TABS
5.0000 mg | ORAL_TABLET | Freq: Every day | ORAL | Status: DC
  Administered 2019-05-23 – 2019-05-27 (×5): 5 mg via ORAL
  Filled 2019-05-23 (×5): qty 1

## 2019-05-23 NOTE — Telephone Encounter (Signed)
FYI

## 2019-05-23 NOTE — ED Notes (Signed)
Patient had loose, bloody stool accident in the floor. Patient, sheets, and floor cleaned.

## 2019-05-23 NOTE — Telephone Encounter (Signed)
I returned pt's call.  He is c/o having bloody diarrhea that started about 6:00 this morning and has been "a half a dozen times since then"   Denies any other symptoms when asked.  He is a kidney pt.   "I almost need dialysis but not yet".   He was agreeable to going to the ED.  He is going to [facility].   I asked if he had anyone to take him or would he need to call 911?    "I'm fine to drive myself".    "I feel ok".  I sent my triage notes to Dr. Marigene Ehlers office so he would be aware of the ED referral.    Reason for Disposition . [1] MODERATE rectal bleeding (small blood clots, passing blood without stool, or toilet water turns red) AND [2] more than once a day  Answer Assessment - Initial Assessment Questions 1. APPEARANCE of BLOOD: "What color is it?" "Is it passed separately, on the surface of the stool, or mixed in with the stool?"      I'm having diarrhea that's bloody.  It's a bright red.  No blood thinners.   I take aspirin daily. 2. AMOUNT: "How much blood was passed?"      It just started this morning.   Been half a dozen times. 3. FREQUENCY: "How many times has blood been passed with the stools?"      Every time I go 4. ONSET: "When was the blood first seen in the stools?" (Days or weeks)      6:00 AM this morning. 5. DIARRHEA: "Is there also some diarrhea?" If so, ask: "How many diarrhea stools were passed in past 24 hours?"      Yes    No diarrhea prior to today.   6. CONSTIPATION: "Do you have constipation?" If so, "How bad is it?"     No 7. RECURRENT SYMPTOMS: "Have you had blood in your stools before?" If so, ask: "When was the last time?" and "What happened that time?"      No    8. BLOOD THINNERS: "Do you take any blood thinners?" (e.g., Coumadin/warfarin, Pradaxa/dabigatran, aspirin)     Aspirin daily 9. OTHER SYMPTOMS: "Do you have any other symptoms?"  (e.g., abdominal pain, vomiting, dizziness, fever)     No abd pain, no dizziness, no N/V.   No pain in  rectal area. 10. PREGNANCY: "Is there any chance you are pregnant?" "When was your last menstrual period?"       N/A  Protocols used: RECTAL BLEEDING-A-AH

## 2019-05-23 NOTE — H&P (View-Only) (Signed)
Referring Provider: EDP, Dr. Jeanell Sparrow Primary Care Physician:  [name], MD Primary Gastroenterologist:  Althia Forts  Reason for Consultation:  GIB  HPI: Caleb Hicks is a 78 y.o. 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  He had 6 loose bloody maroon bowel movements at home.  Felt well yesterday and last evening.  He has had two red to maroon bowel movements while in the ER.  He denies nausea, vomiting, abdominal pain.  He had a colonoscopy in [place] probably close to 10 years ago.  He takes aspirin 325 mg daily at home.  Hgb 10.7 grams.  Four weeks ago it was 11.3 grams.  Has history of GI bleed in October 2018 at which time he was seen by Dr. Therisa Doyne from [place].  Was presumed to be diverticular in origin.  Progress note said that he should follow-up with her in 3 to 4 weeks for an outpatient colonoscopy, but that was never done.  He does have diverticular disease as seen on 3 CT scans over the course the past 2 years.  2 of those showing diverticulitis.  He has some degree of chronic anemia, likely in part due to his renal disease.   Past Medical History:  Diagnosis Date   AAA (abdominal aortic aneurysm) ([place]): 4 cm 03/24/2017   3.5X 3. 5 CM PER 10-6 ABDOMINAL CT FOLOOWED EVRY 2 YEARS    Abdominal aneurysm ([place])    Acute kidney injury superimposed on CKD ([place]): Stage 4 03/24/2017   Anemia    Arthritis    Cancer ([place])    skin cancer - basal cell   Diverticulitis 03/2017   1 UNIT BLOOD GIVEN   Gout    Hepatic cyst: Solitary 03/24/2017   Hepatitis    Hepatitis A   Hydrocele sac    Hyperlipidemia 03/24/2017   Hypertension    Hyperuricemia 03/24/2017   Kidney transplanted 12/12/1995   Pre-diabetes    Pt states he has "high blood sugar" but is not diabetic or pre-diabetic. He states he hasn't  had an A1C done. He does take Januvia daily.   Renal transplant, status post: 21 years ago: 1997 03/24/2017    Past Surgical History:  Procedure Laterality Date   AV FISTULA PLACEMENT Left 11/12/2018   Procedure: BRACHIOBASILIC ARTERIOVENOUS (AV) FISTULA CREATION LEFT ARM;  Surgeon: Waynetta Sandy, MD;  Location: [facility];  Service: Vascular;  Laterality: Left;   AV FISTULA PLACEMENT Left 12/26/2018   Procedure: Insertion Of Arteriovenous (Av) Gore-Tex Graft Left Arm;  Surgeon: Waynetta Sandy, MD;  Location: [facility];  Service: Vascular;  Laterality: Left;   COLONOSCOPY     CYST REMOVED FROM SPINE  YRS AGO   HERNIA REPAIR     SEVERAL   HYDROCELE EXCISION Bilateral 06/04/2017   Procedure: BILATERAL HYDROCELECTOMY ADULT;  Surgeon: Lucas Mallow, MD;  Location: [facility];  Service: Urology;  Laterality: Bilateral;   KIDNEY TRANSPLANT     PILIODINAL CYST  40-50 YRS AGO    Prior to Admission medications   Medication Sig Start Date End Date Taking? Authorizing Provider  aspirin 325 MG EC tablet Take 325 mg by mouth daily.    [provider]  atorvastatin (LIPITOR) 40 MG tablet Take 1 tablet (40 mg total) by mouth daily. 05/14/18   [name], MD  calcitRIOL (ROCALTROL) 0.25 MCG capsule Take 1 capsule (0.25 mcg total) by mouth daily. Patient taking differently: Take 0.25 mcg by mouth every other day.  11/24/17   Charlynne Cousins, MD  calcium carbonate (OS-CAL - DOSED IN MG OF ELEMENTAL CALCIUM) 1250 (500 Ca) MG tablet Take 1-2 tablets by mouth See admin instructions. 2 tablets in the morning, and 1 tablet in the evening    [provider]  Ferrous Sulfate (IRON PO) Take 1 tablet by mouth 2 (two) times a day.    [provider]  furosemide (LASIX) 40 MG tablet Take 1 tablet (40 mg total) by mouth daily. 11/27/17   Charlynne Cousins, MD  JANUVIA 25 MG tablet TAKE 1 TABLET(25 MG) BY MOUTH DAILY 04/14/19   [name], MD  MAGNESIUM-OXIDE 400 (241.3 Mg) MG tablet Take 1 tablet by mouth 2 (two) times a day. 12/19/18   [provider]  metoprolol tartrate (LOPRESSOR) 50 MG tablet Take 50 mg by mouth 2 (two) times daily. 12/25/16   [provider]  NEORAL 25 MG capsule Take 2 tablets by mouth 2 (two) times a day. 10/25/18   [provider]  predniSONE (DELTASONE) 5 MG tablet Take 5 mg by mouth daily. 03/16/17   [provider]  senna-docusate (SENOKOT-S) 8.6-50 MG tablet Take 1 tablet by mouth at bedtime. Patient taking differently: Take 1 tablet by mouth at bedtime as needed for moderate constipation.  03/28/17   Eugenie Filler, MD  sodium bicarbonate 650 MG tablet Take 1,300 mg by mouth 2 (two) times daily.    [provider]    Current Facility-Administered Medications  Medication Dose Route Frequency Provider Last Rate Last Dose   allopurinol (ZYLOPRIM) tablet 200 mg  200 mg Oral Daily Georgette Shell, MD       atorvastatin (LIPITOR) tablet 40 mg  40 mg Oral Daily Georgette Shell, MD       calcitRIOL (ROCALTROL) capsule 0.25 mcg  0.25 mcg Oral Daily Georgette Shell, MD       metoprolol tartrate (LOPRESSOR) tablet 50 mg  50 mg Oral BID Georgette Shell, MD       pantoprazole (PROTONIX) injection 40 mg  40 mg Intravenous Q12H Georgette Shell, MD       predniSONE (DELTASONE) tablet 5 mg  5 mg Oral Daily Georgette Shell, MD       sodium bicarbonate tablet 1,300 mg  1,300 mg Oral BID Georgette Shell, MD       Current Outpatient Medications  Medication Sig Dispense Refill   allopurinol (ZYLOPRIM) 100 MG tablet Take 200 mg by mouth daily.     aspirin 325 MG EC tablet Take 325 mg by mouth daily.     atorvastatin (LIPITOR) 40 MG tablet Take 1 tablet (40 mg total) by mouth daily. 90 tablet 3   calcitRIOL (ROCALTROL) 0.25 MCG capsule Take 1 capsule (0.25 mcg total) by mouth daily. (Patient taking differently: Take 0.25 mcg by  mouth every 3 (three) days. ) 30 capsule 0   calcium carbonate (OS-CAL - DOSED IN MG OF ELEMENTAL CALCIUM) 1250 (500 Ca) MG tablet Take 1-2 tablets by mouth See admin instructions. 2 tablets in the morning, and 1 tablet in the evening     Ferrous Sulfate (IRON PO) Take 1 tablet by mouth 2 (two) times a day.     furosemide (LASIX) 40 MG tablet Take 1 tablet (40 mg total) by mouth daily. [place]  tablet    JANUVIA 25 MG tablet TAKE 1 TABLET(25 MG) BY MOUTH DAILY (Patient taking differently: Take 25 mg by mouth daily. ) 90 tablet 0   MAGNESIUM-OXIDE 400 (241.3 Mg) MG tablet Take 1 tablet by mouth 2 (two) times a day.     metoprolol tartrate (LOPRESSOR) 50 MG tablet Take 50 mg by mouth 2 (two) times daily.  0   NEORAL 25 MG capsule Take 2 tablets by mouth 2 (two) times a day.     predniSONE (DELTASONE) 5 MG tablet Take 5 mg by mouth daily.  0   senna-docusate (SENOKOT-S) 8.6-50 MG tablet Take 1 tablet by mouth at bedtime. (Patient taking differently: Take 1 tablet by mouth at bedtime as needed for moderate constipation. )     sodium bicarbonate 650 MG tablet Take 1,300 mg by mouth 2 (two) times daily.      Allergies as of 05/23/2019 - Review Complete 05/23/2019  Allergen Reaction Noted   Norvasc [amlodipine besylate] Other (See Comments) 03/24/2017   Tape Other (See Comments) 06/04/2017    Family History  Problem Relation Age of Onset   Heart attack Father    AAA (abdominal aortic aneurysm) Brother    Cerebral aneurysm Brother     Social History   Socioeconomic History   Marital status: Divorced    Spouse name: Not on file   Number of children: Not on file   Years of education: Not on file   Highest education level: Not on file  Occupational History   Not on file  Social Needs   Financial resource strain: Not on file   Food insecurity    Worry: Not on file    Inability: Not on file   Transportation needs    Medical: Not on file    Non-medical: Not on file    Tobacco Use   Smoking status: Former Smoker    Packs/day: 2.00    Years: 20.00    Pack years: 40.00    Types: Cigarettes   Smokeless tobacco: Never Used   Tobacco comment: 1984  Substance and Sexual Activity   Alcohol use: No    Frequency: Never   Drug use: No   Sexual activity: Not on file  Lifestyle   Physical activity    Days per week: Not on file    Minutes per session: Not on file   Stress: Not on file  Relationships   Social connections    Talks on phone: Not on file    Gets together: Not on file    Attends religious service: Not on file    Active member of club or organization: Not on file    Attends meetings of clubs or organizations: Not on file    Relationship status: Not on file   Intimate partner violence    Fear of current or ex partner: Not on file    Emotionally abused: Not on file    Physically abused: Not on file    Forced sexual activity: Not on file  Other Topics Concern   Not on file  Social History Narrative   Not on file    Review of Systems: ROS is O/W negative except as mentioned in HPI.  Physical Exam: Vital signs in last 24 hours: Temp:  [97.5 F (36.4 C)] 97.5 F (36.4 C) (12/04 1047) Pulse Rate:  [55-60] 56 (12/04 1537) Resp:  [15-25] 18 (12/04 1537) BP: (160-182)/(53-73) 165/67 (12/04 1537) SpO2:  [98 %-100 %] 99 % (12/04 1537)  Weight:  [68.5 kg] 68.5 kg (12/04 1044)   General:  Alert, Well-developed, well-nourished, pleasant and cooperative in NAD Head:  Normocephalic and atraumatic. Eyes:  Sclera clear, no icterus.  Conjunctiva pink. Ears:  Normal auditory acuity. Mouth:  No deformity or lesions.   Lungs:  Clear throughout to auscultation.  No wheezes, crackles, or rhonchi.  Heart:  Slightly bradycardic; no murmurs, clicks, rubs, or gallops. Abdomen:  Soft, non-distended.  BS present and hyperactive.  Non-tender.   Rectal:  Had blood in bedside commode. Msk:  Symmetrical without gross deformities. Pulses:   Normal pulses noted. Extremities:  Without clubbing or edema. Neurologic:  Alert and oriented x 4;  grossly normal neurologically. Skin:  Intact without significant lesions or rashes. Psych:  Alert and cooperative. Normal mood and affect.  Lab Results: Recent Labs    05/23/19 1048 05/23/19 1537  WBC 10.3  --   HGB 10.7* 10.3*  HCT 36.1* 33.9*  PLT 136*  --    BMET Recent Labs    05/23/19 1316  NA 141  K 4.5  CL 104  CO2 26  GLUCOSE 117*  BUN 54*  CREATININE 3.12*  CALCIUM 9.6   LFT Recent Labs    05/23/19 1316  PROT 5.4*  ALBUMIN 2.8*  AST 10*  ALT 8  ALKPHOS 55  BILITOT 0.7   Studies/Results: Dg Chest Port 1 View  Result Date: 05/23/2019 CLINICAL DATA:  GI bleed EXAM: PORTABLE CHEST 1 VIEW COMPARISON:  11/17/2017 FINDINGS: The patient's neck and chin are obscuring the apices. No focal opacity or pleural effusion. Normal heart size. Aortic atherosclerosis. No pneumothorax. Clips in the left axillary region. IMPRESSION: No active disease. Electronically Signed   By: Donavan Foil M.D.   On: 05/23/2019 14:10   IMPRESSION:  *GIB:  Sounds like diverticular in origin.  Has history of the same in 2018.  Never followed up for outpatient colonoscopy and last was about 10 years ago. *Anemia:  Has chronic anemia, likely at least in part due to his renal disease.  Hgb actually better than what it has been in the past, but overall down about one gram since 4 weeks ago.  I expect that it will drop more.  PLAN: *I was going to order a NM bleeding scan, but I am not sure that he is still bleeding brisk enough for this to be positive.  If continues to bleed and has frequent bloody bowel movements then this would be the next step as he cannot get CTA due to renal status. *Monitor Hgb and transfuse prn. *Recommend inpatient colonoscopy at some point before discharge to be sure no other issues driving this.   Caleb Hicks. Warden Buffa  05/23/2019, 4:25 PM

## 2019-05-23 NOTE — ED Notes (Signed)
ED Provider at bedside. 

## 2019-05-23 NOTE — Telephone Encounter (Signed)
See note

## 2019-05-23 NOTE — Telephone Encounter (Signed)
Pt has arrived at the ED.

## 2019-05-23 NOTE — ED Notes (Signed)
THis RN attempted IV for lab work and access, was unsuccessful. Will get RN with ultrasound to attempt IV and obtain lab specimens.

## 2019-05-23 NOTE — Consult Note (Signed)
Referring Provider: EDP, Dr. Jeanell Sparrow Primary Care Physician:  [name], MD Primary Gastroenterologist:  Althia Forts  Reason for Consultation:  GIB  HPI: Caleb Hicks is a 78 y.o. 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  He had 6 loose bloody maroon bowel movements at home.  Felt well yesterday and last evening.  He has had two red to maroon bowel movements while in the ER.  He denies nausea, vomiting, abdominal pain.  He had a colonoscopy in [place] probably close to 10 years ago.  He takes aspirin 325 mg daily at home.  Hgb 10.7 grams.  Four weeks ago it was 11.3 grams.  Has history of GI bleed in October 2018 at which time he was seen by Dr. Therisa Doyne from [place].  Was presumed to be diverticular in origin.  Progress note said that he should follow-up with her in 3 to 4 weeks for an outpatient colonoscopy, but that was never done.  He does have diverticular disease as seen on 3 CT scans over the course the past 2 years.  2 of those showing diverticulitis.  He has some degree of chronic anemia, likely in part due to his renal disease.   Past Medical History:  Diagnosis Date   AAA (abdominal aortic aneurysm) ([place]): 4 cm 03/24/2017   3.5X 3. 5 CM PER 10-6 ABDOMINAL CT FOLOOWED EVRY 2 YEARS    Abdominal aneurysm ([place])    Acute kidney injury superimposed on CKD ([place]): Stage 4 03/24/2017   Anemia    Arthritis    Cancer ([place])    skin cancer - basal cell   Diverticulitis 03/2017   1 UNIT BLOOD GIVEN   Gout    Hepatic cyst: Solitary 03/24/2017   Hepatitis    Hepatitis A   Hydrocele sac    Hyperlipidemia 03/24/2017   Hypertension    Hyperuricemia 03/24/2017   Kidney transplanted 12/12/1995   Pre-diabetes    Pt states he has "high blood sugar" but is not diabetic or pre-diabetic. He states he hasn't  had an A1C done. He does take Januvia daily.   Renal transplant, status post: 21 years ago: 1997 03/24/2017    Past Surgical History:  Procedure Laterality Date   AV FISTULA PLACEMENT Left 11/12/2018   Procedure: BRACHIOBASILIC ARTERIOVENOUS (AV) FISTULA CREATION LEFT ARM;  Surgeon: Waynetta Sandy, MD;  Location: [facility];  Service: Vascular;  Laterality: Left;   AV FISTULA PLACEMENT Left 12/26/2018   Procedure: Insertion Of Arteriovenous (Av) Gore-Tex Graft Left Arm;  Surgeon: Waynetta Sandy, MD;  Location: [facility];  Service: Vascular;  Laterality: Left;   COLONOSCOPY     CYST REMOVED FROM SPINE  YRS AGO   HERNIA REPAIR     SEVERAL   HYDROCELE EXCISION Bilateral 06/04/2017   Procedure: BILATERAL HYDROCELECTOMY ADULT;  Surgeon: Lucas Mallow, MD;  Location[facility];  Service: Urology;  Laterality: Bilateral;   KIDNEY TRANSPLANT     PILIODINAL CYST  40-50 YRS AGO    Prior to Admission medications   Medication Sig Start Date End Date Taking? Authorizing Provider  aspirin 325 MG EC tablet Take 325 mg by mouth daily.    [provider]  atorvastatin (LIPITOR) 40 MG tablet Take 1 tablet (40 mg total) by mouth daily. 05/14/18   [name], MD  calcitRIOL (ROCALTROL) 0.25 MCG capsule Take 1 capsule (0.25 mcg total) by mouth daily. Patient taking differently: Take 0.25 mcg by mouth every other day.  11/24/17   Charlynne Cousins, MD  calcium carbonate (OS-CAL - DOSED IN MG OF ELEMENTAL CALCIUM) 1250 (500 Ca) MG tablet Take 1-2 tablets by mouth See admin instructions. 2 tablets in the morning, and 1 tablet in the evening    [provider]  Ferrous Sulfate (IRON PO) Take 1 tablet by mouth 2 (two) times a day.    [provider]  furosemide (LASIX) 40 MG tablet Take 1 tablet (40 mg total) by mouth daily. 11/27/17   Charlynne Cousins, MD  JANUVIA 25 MG tablet TAKE 1 TABLET(25 MG) BY MOUTH DAILY 04/14/19   [name], MD  MAGNESIUM-OXIDE 400 (241.3 Mg) MG tablet Take 1 tablet by mouth 2 (two) times a day. 12/19/18   [provider]  metoprolol tartrate (LOPRESSOR) 50 MG tablet Take 50 mg by mouth 2 (two) times daily. 12/25/16   [provider]  NEORAL 25 MG capsule Take 2 tablets by mouth 2 (two) times a day. 10/25/18   [provider]  predniSONE (DELTASONE) 5 MG tablet Take 5 mg by mouth daily. 03/16/17   [provider]  senna-docusate (SENOKOT-S) 8.6-50 MG tablet Take 1 tablet by mouth at bedtime. Patient taking differently: Take 1 tablet by mouth at bedtime as needed for moderate constipation.  03/28/17   Eugenie Filler, MD  sodium bicarbonate 650 MG tablet Take 1,300 mg by mouth 2 (two) times daily.    [provider]    Current Facility-Administered Medications  Medication Dose Route Frequency Provider Last Rate Last Dose   allopurinol (ZYLOPRIM) tablet 200 mg  200 mg Oral Daily Georgette Shell, MD       atorvastatin (LIPITOR) tablet 40 mg  40 mg Oral Daily Georgette Shell, MD       calcitRIOL (ROCALTROL) capsule 0.25 mcg  0.25 mcg Oral Daily Georgette Shell, MD       metoprolol tartrate (LOPRESSOR) tablet 50 mg  50 mg Oral BID Georgette Shell, MD       pantoprazole (PROTONIX) injection 40 mg  40 mg Intravenous Q12H Georgette Shell, MD       predniSONE (DELTASONE) tablet 5 mg  5 mg Oral Daily Georgette Shell, MD       sodium bicarbonate tablet 1,300 mg  1,300 mg Oral BID Georgette Shell, MD       Current Outpatient Medications  Medication Sig Dispense Refill   allopurinol (ZYLOPRIM) 100 MG tablet Take 200 mg by mouth daily.     aspirin 325 MG EC tablet Take 325 mg by mouth daily.     atorvastatin (LIPITOR) 40 MG tablet Take 1 tablet (40 mg total) by mouth daily. 90 tablet 3   calcitRIOL (ROCALTROL) 0.25 MCG capsule Take 1 capsule (0.25 mcg total) by mouth daily. (Patient taking differently: Take 0.25 mcg by  mouth every 3 (three) days. ) 30 capsule 0   calcium carbonate (OS-CAL - DOSED IN MG OF ELEMENTAL CALCIUM) 1250 (500 Ca) MG tablet Take 1-2 tablets by mouth See admin instructions. 2 tablets in the morning, and 1 tablet in the evening     Ferrous Sulfate (IRON PO) Take 1 tablet by mouth 2 (two) times a day.     furosemide (LASIX) 40 MG tablet Take 1 tablet (40 mg total) by mouth daily. [place]  tablet    JANUVIA 25 MG tablet TAKE 1 TABLET(25 MG) BY MOUTH DAILY (Patient taking differently: Take 25 mg by mouth daily. ) 90 tablet 0   MAGNESIUM-OXIDE 400 (241.3 Mg) MG tablet Take 1 tablet by mouth 2 (two) times a day.     metoprolol tartrate (LOPRESSOR) 50 MG tablet Take 50 mg by mouth 2 (two) times daily.  0   NEORAL 25 MG capsule Take 2 tablets by mouth 2 (two) times a day.     predniSONE (DELTASONE) 5 MG tablet Take 5 mg by mouth daily.  0   senna-docusate (SENOKOT-S) 8.6-50 MG tablet Take 1 tablet by mouth at bedtime. (Patient taking differently: Take 1 tablet by mouth at bedtime as needed for moderate constipation. )     sodium bicarbonate 650 MG tablet Take 1,300 mg by mouth 2 (two) times daily.      Allergies as of 05/23/2019 - Review Complete 05/23/2019  Allergen Reaction Noted   Norvasc [amlodipine besylate] Other (See Comments) 03/24/2017   Tape Other (See Comments) 06/04/2017    Family History  Problem Relation Age of Onset   Heart attack Father    AAA (abdominal aortic aneurysm) Brother    Cerebral aneurysm Brother     Social History   Socioeconomic History   Marital status: Divorced    Spouse name: Not on file   Number of children: Not on file   Years of education: Not on file   Highest education level: Not on file  Occupational History   Not on file  Social Needs   Financial resource strain: Not on file   Food insecurity    Worry: Not on file    Inability: Not on file   Transportation needs    Medical: Not on file    Non-medical: Not on file    Tobacco Use   Smoking status: Former Smoker    Packs/day: 2.00    Years: 20.00    Pack years: 40.00    Types: Cigarettes   Smokeless tobacco: Never Used   Tobacco comment: 1984  Substance and Sexual Activity   Alcohol use: No    Frequency: Never   Drug use: No   Sexual activity: Not on file  Lifestyle   Physical activity    Days per week: Not on file    Minutes per session: Not on file   Stress: Not on file  Relationships   Social connections    Talks on phone: Not on file    Gets together: Not on file    Attends religious service: Not on file    Active member of club or organization: Not on file    Attends meetings of clubs or organizations: Not on file    Relationship status: Not on file   Intimate partner violence    Fear of current or ex partner: Not on file    Emotionally abused: Not on file    Physically abused: Not on file    Forced sexual activity: Not on file  Other Topics Concern   Not on file  Social History Narrative   Not on file    Review of Systems: ROS is O/W negative except as mentioned in HPI.  Physical Exam: Vital signs in last 24 hours: Temp:  [97.5 F (36.4 C)] 97.5 F (36.4 C) (12/04 1047) Pulse Rate:  [55-60] 56 (12/04 1537) Resp:  [15-25] 18 (12/04 1537) BP: (160-182)/(53-73) 165/67 (12/04 1537) SpO2:  [98 %-100 %] 99 % (12/04 1537)  Weight:  [68.5 kg] 68.5 kg (12/04 1044)   General:  Alert, Well-developed, well-nourished, pleasant and cooperative in NAD Head:  Normocephalic and atraumatic. Eyes:  Sclera clear, no icterus.  Conjunctiva pink. Ears:  Normal auditory acuity. Mouth:  No deformity or lesions.   Lungs:  Clear throughout to auscultation.  No wheezes, crackles, or rhonchi.  Heart:  Slightly bradycardic; no murmurs, clicks, rubs, or gallops. Abdomen:  Soft, non-distended.  BS present and hyperactive.  Non-tender.   Rectal:  Had blood in bedside commode. Msk:  Symmetrical without gross deformities. Pulses:   Normal pulses noted. Extremities:  Without clubbing or edema. Neurologic:  Alert and oriented x 4;  grossly normal neurologically. Skin:  Intact without significant lesions or rashes. Psych:  Alert and cooperative. Normal mood and affect.  Lab Results: Recent Labs    05/23/19 1048 05/23/19 1537  WBC 10.3  --   HGB 10.7* 10.3*  HCT 36.1* 33.9*  PLT 136*  --    BMET Recent Labs    05/23/19 1316  NA 141  K 4.5  CL 104  CO2 26  GLUCOSE 117*  BUN 54*  CREATININE 3.12*  CALCIUM 9.6   LFT Recent Labs    05/23/19 1316  PROT 5.4*  ALBUMIN 2.8*  AST 10*  ALT 8  ALKPHOS 55  BILITOT 0.7   Studies/Results: Dg Chest Port 1 View  Result Date: 05/23/2019 CLINICAL DATA:  GI bleed EXAM: PORTABLE CHEST 1 VIEW COMPARISON:  11/17/2017 FINDINGS: The patient's neck and chin are obscuring the apices. No focal opacity or pleural effusion. Normal heart size. Aortic atherosclerosis. No pneumothorax. Clips in the left axillary region. IMPRESSION: No active disease. Electronically Signed   By: Donavan Foil M.D.   On: 05/23/2019 14:10   IMPRESSION:  *GIB:  Sounds like diverticular in origin.  Has history of the same in 2018.  Never followed up for outpatient colonoscopy and last was about 10 years ago. *Anemia:  Has chronic anemia, likely at least in part due to his renal disease.  Hgb actually better than what it has been in the past, but overall down about one gram since 4 weeks ago.  I expect that it will drop more.  PLAN: *I was going to order a NM bleeding scan, but I am not sure that he is still bleeding brisk enough for this to be positive.  If continues to bleed and has frequent bloody bowel movements then this would be the next step as he cannot get CTA due to renal status. *Monitor Hgb and transfuse prn. *Recommend inpatient colonoscopy at some point before discharge to be sure no other issues driving this.   Laban Emperor. Mikaya Bunner  05/23/2019, 4:25 PM

## 2019-05-23 NOTE — ED Notes (Signed)
ED TO INPATIENT HANDOFF REPORT  Name/Age/Gender Caleb Hicks 78 y.o. male  Code Status    Code Status Orders  (From admission, onward)         Start     Ordered   05/23/19 1521  Do not attempt resuscitation (DNR)  Continuous    Question Answer Comment  In the event of cardiac or respiratory ARREST Do not call a "code blue"   In the event of cardiac or respiratory ARREST Do not perform Intubation, CPR, defibrillation or ACLS   In the event of cardiac or respiratory ARREST Use medication by any route, position, wound care, and other measures to relive pain and suffering. May use oxygen, suction and manual treatment of airway obstruction as needed for comfort.   Comments Confirmed with patient      05/23/19 1521        Code Status History    Date Active Date Inactive Code Status Order ID Comments User Context   12/26/2018 2108 12/30/2018 1927 DNR [id number]  Etta Quill, DO ED   11/07/2017 2100 11/24/2017 1510 DNR [id number]  Toy Baker, MD ED   03/24/2017 1738 03/28/2017 1635 Full Code [id number]  Eugenie Filler, MD ED   Advance Care Planning Activity    Advance Directive Documentation     Most Recent Value  Type of Advance Directive  Healthcare Power of [place], Living will  Pre-existing out of facility DNR order (yellow form or pink MOST form)  -  "MOST" Form in Place?  -      Home/SNF/Other Home  Chief Complaint bloody stool  Level of Care/Admitting Diagnosis ED Disposition    ED Disposition Condition [place]: [place] [100102]  Level of Care: Telemetry [5]  Admit to tele based on following criteria: Complex arrhythmia (Bradycardia/Tachycardia)  Covid Evaluation: Asymptomatic Screening Protocol (No Symptoms)  Diagnosis: GI bleed [086578]  Admitting Physician: Georgette Shell [[id number]]  Attending Physician: Georgette Shell [[id number]]  Estimated length of stay: 3 - 4 days  Certification:: I  certify this patient will need inpatient services for at least 2 midnights  PT Class (Do Not Modify): Inpatient [101]  PT Acc Code (Do Not Modify): Private [1]       Medical History Past Medical History:  Diagnosis Date  . AAA (abdominal aortic aneurysm) ([place]): 4 cm 03/24/2017   3.5X 3. 5 CM PER 10-6 ABDOMINAL CT FOLOOWED EVRY 2 YEARS   . Abdominal aneurysm ([place])   . Acute kidney injury superimposed on CKD [facility]): Stage 4 03/24/2017  . Anemia   . Arthritis   . Cancer ([place])    skin cancer - basal cell  . Diverticulitis 03/2017   1 UNIT BLOOD GIVEN  . Gout   . Hepatic cyst: Solitary 03/24/2017  . Hepatitis    Hepatitis A  . Hydrocele sac   . Hyperlipidemia 03/24/2017  . Hypertension   . Hyperuricemia 03/24/2017  . 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 Pre-diabetes    Pt states he has "high blood sugar" but is not diabetic or pre-diabetic. He states he hasn't had an A1C done. He does take Januvia daily.  . 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 Norvasc [Amlodipine Besylate] Other (See Comments)    Weight gain  . Tape Other (See Comments)    Tears skin    IV Location/Drains/Wounds Patient Lines/Drains/Airways Status   Active Line/Drains/Airways  Name:   Placement date:   Placement time:   Site:   Days:   Peripheral IV 05/23/19 Right;Upper Arm   05/23/19    1150    Arm   less than 1   Fistula / Graft Left Upper arm Arteriovenous fistula   11/12/18    1513    Upper arm   192   Fistula / Graft Left Upper arm Arteriovenous vein graft   12/26/18    1419    Upper arm   148   Incision (Closed) 11/12/18 Arm Left   11/12/18    1507     192   Incision (Closed) 12/26/18 Arm Left   12/26/18    1448     148          Labs/Imaging Results for orders placed or performed during the hospital encounter of 05/23/19 (from the past 48 hour(s))  CBC     Status: Abnormal   Collection Time: 05/23/19 10:48 AM  Result Value Ref  Range   WBC 10.3 4.0 - 10.5 K/uL   RBC 3.47 (L) 4.22 - 5.81 MIL/uL   Hemoglobin 10.7 (L) 13.0 - 17.0 g/dL   HCT 36.1 (L) 39.0 - 52.0 %   MCV 104.0 (H) 80.0 - 100.0 fL   MCH 30.8 26.0 - 34.0 pg   MCHC 29.6 (L) 30.0 - 36.0 g/dL   RDW 14.7 11.5 - 15.5 %   Platelets 136 (L) 150 - 400 K/uL   nRBC 0.0 0.0 - 0.2 %    Comment: Performed at [facility], [place] [address] [place], [place] [address]  Type and screen [place]     Status: None   Collection Time: 05/23/19 10:48 AM  Result Value Ref Range   ABO/RH(D) O NEG    Antibody Screen NEG    Sample Expiration      05/26/2019,2359 Performed at [facility], [place] [address] [place], [place] [address]   POC occult blood, ED     Status: Abnormal   Collection Time: 05/23/19 11:15 AM  Result Value Ref Range   Fecal Occult Bld POSITIVE (A) NEGATIVE  Comprehensive metabolic panel     Status: Abnormal   Collection Time: 05/23/19  1:16 PM  Result Value Ref Range   Sodium 141 135 - 145 mmol/L   Potassium 4.5 3.5 - 5.1 mmol/L   Chloride 104 98 - 111 mmol/L   CO2 26 22 - 32 mmol/L   Glucose, Bld 117 (H) 70 - 99 mg/dL   BUN 54 (H) 8 - 23 mg/dL   Creatinine, Ser 3.12 (H) 0.61 - 1.24 mg/dL   Calcium 9.6 8.9 - 10.3 mg/dL   Total Protein 5.4 (L) 6.5 - 8.1 g/dL   Albumin 2.8 (L) 3.5 - 5.0 g/dL   AST 10 (L) 15 - 41 U/L   ALT 8 0 - 44 U/L   Alkaline Phosphatase 55 38 - 126 U/L   Total Bilirubin 0.7 0.3 - 1.2 mg/dL   GFR calc non Af Amer 18 (L) >60 mL/min   GFR calc Af Amer 21 (L) >60 mL/min   Anion gap 11 5 - 15    Comment: Performed at [facility], [place] [address], [place], [place] [address]  Hemoglobin     Status: Abnormal   Collection Time: 05/23/19  3:37 PM  Result Value Ref Range   Hemoglobin 10.3 (L) 13.0 - 17.0 g/dL    Comment: Performed at [facility]  [place] [address] [place], [place] [address]  Hematocrit     Status: Abnormal   Collection  Time: 05/23/19  3:37 PM  Result Value Ref Range   HCT 33.9 (L) 39.0 - 52.0 %    Comment: Performed at [facility], [place] [address], [place], [place] [address]   Dg Chest Port 1 View  Result Date: 05/23/2019 CLINICAL DATA:  GI bleed EXAM: PORTABLE CHEST 1 VIEW COMPARISON:  11/17/2017 FINDINGS: The patient's neck and chin are obscuring the apices. No focal opacity or pleural effusion. Normal heart size. Aortic atherosclerosis. No pneumothorax. Clips in the left axillary region. IMPRESSION: No active disease. Electronically Signed   By: Donavan Foil M.D.   On: 05/23/2019 14:10    Pending Labs Unresulted Labs (From admission, onward)    Start     Ordered   05/24/19 1975  Basic metabolic panel  Tomorrow morning,   R     05/23/19 1521   05/24/19 0500  CBC  Tomorrow morning,   R     05/23/19 1521   05/23/19 1427  SARS CORONAVIRUS 2 (TAT 6-24 HRS) Nasopharyngeal Nasopharyngeal Swab  (Asymptomatic/Tier 3)  Once,   STAT    Question Answer Comment  Is this test for diagnosis or screening Screening   Symptomatic for COVID-19 as defined by CDC No   Hospitalized for COVID-19 No   Admitted to ICU for COVID-19 No   Previously tested for COVID-19 Yes   Resident in a congregate (group) care setting No   Employed in healthcare setting No      05/23/19 1426          Vitals/Pain Today's Vitals   05/23/19 1630 05/23/19 1700 05/23/19 1730 05/23/19 1830  BP: (!) 157/59 (!) 152/59 (!) 150/60 (!) 163/57  Pulse: (!) 53 (!) 54 (!) 52 (!) 55  Resp: 18   18  Temp:      TempSrc:      SpO2: 96% 95% 97% 100%  Weight:      Height:      PainSc:        Isolation Precautions No active isolations  Medications Medications  allopurinol (ZYLOPRIM) tablet 200 mg (has no administration in time range)  atorvastatin (LIPITOR) tablet 40 mg (has no administration in time range)  calcitRIOL (ROCALTROL) capsule 0.25 mcg (has no administration in time range)  metoprolol tartrate (LOPRESSOR)  tablet 50 mg (has no administration in time range)  predniSONE (DELTASONE) tablet 5 mg (has no administration in time range)  sodium bicarbonate tablet 1,300 mg (has no administration in time range)  pantoprazole (PROTONIX) injection 40 mg (has no administration in time range)    Mobility walks

## 2019-05-23 NOTE — Telephone Encounter (Signed)
Noted. Agree with ED dispo.  Algis Greenhouse. Jerline Pain, MD 05/23/2019 1:11 PM

## 2019-05-23 NOTE — ED Notes (Signed)
Called main lab to come stick patient for repeat CMP due to first one being hemolized.

## 2019-05-23 NOTE — ED Triage Notes (Signed)
Patient c/o bright red diarrhea this morning.

## 2019-05-23 NOTE — H&P (Signed)
History and Physical    Caleb Hicks:[id number] DOB: 14-Oct-1940 DOA: 05/23/2019  PCP: [name], MD Patient coming from: Home  Chief Complaint: Rectal bleeding  HPI: Caleb Hicks is a 78 y.o. male with medical history significant of failed kidney transplant, ESRD, gout, hypertension, hyperlipidemia, anemia, AAA admitted with rectal bleeding that started at 6 AM today.  He had 6 loose bloody maroon bowel movements.  And he had a maroon bowel movement while he was in the ER.  He denies nausea vomiting abdominal pain chest pain shortness of breath dizziness fever chills or cough.  He had a colonoscopy in [place] 5 years ago which was  told to be clear.  He does not know if he ever had a EGD.  He takes aspirin 325 mg daily at home.  He denies hematemesis or hematuria. Chest x-ray negative Covid pending He denies Covid exposure he lives at home alone but he does visit his daughters often.  He denies fever nasal congestion cough lack of sensation smell or taste. He had a AV fistula placed left upper extremity last summer for possible dialysis. Nephrologist is Dr. [name].  ED Course: His vital signs were stable in the ER.  His hemoglobin was 10.7 white count 10.3 platelet count 136 glucose 117 fecal occult blood was positive sodium 141 potassium 4.5 BUN 54 creatinine 3.12.  Review of Systems: As per HPI otherwise all other systems reviewed and are negative  Ambulatory Status:  Past Medical History:  Diagnosis Date  . AAA (abdominal aortic aneurysm) ([place]): 4 cm 03/24/2017   3.5X 3. 5 CM PER 10-6 ABDOMINAL CT FOLOOWED EVRY 2 YEARS   . Abdominal aneurysm ([place])   . Acute kidney injury superimposed on CKD [facility]): Stage 4 03/24/2017  . Anemia   . Arthritis   . Cancer ([place])    skin cancer - basal cell  . Diverticulitis 03/2017   1 UNIT BLOOD GIVEN  . Gout   . Hepatic cyst: Solitary 03/24/2017  . Hepatitis    Hepatitis A  . Hydrocele sac   . Hyperlipidemia 03/24/2017  .  Hypertension   . Hyperuricemia 03/24/2017  . 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 Pre-diabetes    Pt states he has "high blood sugar" but is not diabetic or pre-diabetic. He states he hasn't had an A1C done. He does take Januvia daily.  . 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 AV FISTULA PLACEMENT Left 11/12/2018   Procedure: BRACHIOBASILIC ARTERIOVENOUS (AV) FISTULA CREATION LEFT ARM;  Surgeon: Waynetta Sandy, MD;  Location: [place];  Service: Vascular;  Laterality: Left;  . AV FISTULA PLACEMENT Left 12/26/2018   Procedure: Insertion Of Arteriovenous (Av) Gore-Tex Graft Left Arm;  Surgeon: Waynetta Sandy, MD;  Location: [place];  Service: Vascular;  Laterality: Left;  . COLONOSCOPY    . CYST REMOVED FROM SPINE  YRS AGO  . HERNIA REPAIR     SEVERAL  . HYDROCELE EXCISION Bilateral 06/04/2017   Procedure: BILATERAL HYDROCELECTOMY ADULT;  Surgeon: Lucas Mallow, MD;  Location[facility];  Service: Urology;  Laterality: Bilateral;  . KIDNEY TRANSPLANT    . PILIODINAL CYST  40-50 YRS AGO    Social History   Socioeconomic History  . Marital status: Divorced    Spouse name: Not on file  . Number of children: Not on file  . Years of education: Not on file  .  Highest education level: Not on file  Occupational History  . Not on file  Social Needs  . Financial resource strain: Not on file  . Food insecurity    Worry: Not on file    Inability: Not on file  . Transportation needs    Medical: Not on file    Non-medical: Not on file  Tobacco Use  . Smoking status: Former Smoker    Packs/day: 2.00    Years: 20.00    Pack years: 40.00    Types: Cigarettes  . Smokeless tobacco: Never Used  . Tobacco comment: 1984  Substance and Sexual Activity  . Alcohol use: No    Frequency: Never  . Drug use: No  . Sexual activity: Not on file  Lifestyle  . Physical activity    Days  per week: Not on file    Minutes per session: Not on file  . Stress: Not on file  Relationships  . Social Herbalist on phone: Not on file    Gets together: Not on file    Attends religious service: Not on file    Active member of club or organization: Not on file    Attends meetings of clubs or organizations: Not on file    Relationship status: Not on file  . Intimate partner violence    Fear of current or ex partner: Not on file    Emotionally abused: Not on file    Physically abused: Not on file    Forced sexual activity: Not on file  Other Topics Concern  . Not on file  Social History Narrative  . Not on file    Allergies  Allergen Reactions  . Norvasc [Amlodipine Besylate] Other (See Comments)    Weight gain  . Tape Other (See Comments)    Tears skin    Family History  Problem Relation Age of Onset  . Heart attack Father   . AAA (abdominal aortic aneurysm) Brother   . Cerebral aneurysm Brother       Prior to Admission medications   Medication Sig Start Date End Date Taking? Authorizing Provider  allopurinol (ZYLOPRIM) 100 MG tablet Take 200 mg by mouth daily. 04/16/19   [provider]  aspirin 325 MG EC tablet Take 325 mg by mouth daily.    [provider]  atorvastatin (LIPITOR) 40 MG tablet Take 1 tablet (40 mg total) by mouth daily. 05/14/18   [name], MD  calcitRIOL (ROCALTROL) 0.25 MCG capsule Take 1 capsule (0.25 mcg total) by mouth daily. Patient taking differently: Take 0.25 mcg by mouth every other day.  11/24/17   Charlynne Cousins, MD  calcium carbonate (OS-CAL - DOSED IN MG OF ELEMENTAL CALCIUM) 1250 (500 Ca) MG tablet Take 1-2 tablets by mouth See admin instructions. 2 tablets in the morning, and 1 tablet in the evening    [provider]  Ferrous Sulfate (IRON PO) Take 1 tablet by mouth 2 (two) times a day.    [provider]  furosemide (LASIX) 40 MG tablet Take 1 tablet (40 mg total) by mouth  daily. 11/27/17   Charlynne Cousins, MD  JANUVIA 25 MG tablet TAKE 1 TABLET(25 MG) BY MOUTH DAILY 04/14/19   [name], MD  MAGNESIUM-OXIDE 400 (241.3 Mg) MG tablet Take 1 tablet by mouth 2 (two) times a day. 12/19/18   [provider]  metoprolol tartrate (LOPRESSOR) 50 MG tablet Take 50 mg by mouth 2 (two) times  daily. 12/25/16   [provider]  NEORAL 25 MG capsule Take 2 tablets by mouth 2 (two) times a day. 10/25/18   [provider]  predniSONE (DELTASONE) 5 MG tablet Take 5 mg by mouth daily. 03/16/17   [provider]  senna-docusate (SENOKOT-S) 8.6-50 MG tablet Take 1 tablet by mouth at bedtime. Patient taking differently: Take 1 tablet by mouth at bedtime as needed for moderate constipation.  03/28/17   Eugenie Filler, MD  sodium bicarbonate 650 MG tablet Take 1,300 mg by mouth 2 (two) times daily.    [provider]    Physical Exam: Vitals:   05/23/19 1047 05/23/19 1330 05/23/19 1332 05/23/19 1400  BP: (!) 174/53 (!) 182/71 (!) 179/63 (!) 160/73  Pulse: (!) 55 60 (!) 55 (!) 56  Resp: 16 15 18  (!) 25  Temp: (!) 97.5 F (36.4 C)     TempSrc: Oral     SpO2: 100% 99% 98% 100%  Weight:      Height:         . General:  Appears calm and comfortable . Eyes:  PERRL, EOMI, normal lids, iris . ENT: grossly normal hearing, lips & tongue, mmm . Neck: no LAD, masses or thyromegaly . Cardiovascular:  RRR, no m/r/g. No LE edema.  Marland Kitchen Respiratory:  CTA bilaterally, no w/r/r. Normal respiratory effort. . Abdomen:  soft, ntnd, NABS . Skin: no rash or induration seen on limited exam . Musculoskeletal: grossly normal tone BUE/BLE, good ROM, no bony abnormality . Psychiatric: grossly normal mood and affect, speech fluent and appropriate, AOx3 . Neurologic: CN 2-12 grossly intact, moves all extremities in coordinated fashion, sensation intact  Labs on Admission: I have personally reviewed following labs and imaging studies  CBC: Recent  Labs  Lab 05/23/19 1048  WBC 10.3  HGB 10.7*  HCT 36.1*  MCV 104.0*  PLT 242*   Basic Metabolic Panel: Recent Labs  Lab 05/23/19 1316  NA 141  K 4.5  CL 104  CO2 26  GLUCOSE 117*  BUN 54*  CREATININE 3.12*  CALCIUM 9.6   GFR: Estimated Creatinine Clearance: 17.9 mL/min (A) (by C-G formula based on SCr of 3.12 mg/dL (H)). Liver Function Tests: Recent Labs  Lab 05/23/19 1316  AST 10*  ALT 8  ALKPHOS 55  BILITOT 0.7  PROT 5.4*  ALBUMIN 2.8*   No results for input(s): LIPASE, AMYLASE in the last 168 hours. No results for input(s): AMMONIA in the last 168 hours. Coagulation Profile: No results for input(s): INR, PROTIME in the last 168 hours. Cardiac Enzymes: No results for input(s): CKTOTAL, CKMB, CKMBINDEX, TROPONINI in the last 168 hours. BNP (last 3 results) No results for input(s): PROBNP in the last 8760 hours. HbA1C: No results for input(s): HGBA1C in the last 72 hours. CBG: No results for input(s): GLUCAP in the last 168 hours. Lipid Profile: No results for input(s): CHOL, HDL, LDLCALC, TRIG, CHOLHDL, LDLDIRECT in the last 72 hours. Thyroid Function Tests: No results for input(s): TSH, T4TOTAL, FREET4, T3FREE, THYROIDAB in the last 72 hours. Anemia Panel: No results for input(s): VITAMINB12, FOLATE, FERRITIN, TIBC, IRON, RETICCTPCT in the last 72 hours. Urine analysis:    Component Value Date/Time   COLORURINE YELLOW 04/09/2018 [place] 04/09/2018 1449   LABSPEC 1.011 04/09/2018 1449   PHURINE 5.0 04/09/2018 1449   GLUCOSEU NEGATIVE 04/09/2018 1449   HGBUR MODERATE (A) 04/09/2018 1449   BILIRUBINUR NEGATIVE 04/09/2018 [place] 04/09/2018 1449  PROTEINUR NEGATIVE 04/09/2018 1449   NITRITE NEGATIVE 04/09/2018 1449   LEUKOCYTESUR NEGATIVE 04/09/2018 1449    Creatinine Clearance: Estimated Creatinine Clearance: 17.9 mL/min (A) (by C-G formula based on SCr of 3.12 mg/dL (H)).  Sepsis Labs:  @LABRCNTIP (procalcitonin:4,lacticidven:4) )No results found for this or any previous visit (from the past 240 hour(s)).   Radiological Exams on Admission: Dg Chest Port 1 View  Result Date: 05/23/2019 CLINICAL DATA:  GI bleed EXAM: PORTABLE CHEST 1 VIEW COMPARISON:  11/17/2017 FINDINGS: The patient's neck and chin are obscuring the apices. No focal opacity or pleural effusion. Normal heart size. Aortic atherosclerosis. No pneumothorax. Clips in the left axillary region. IMPRESSION: No active disease. Electronically Signed   By: Donavan Foil M.D.   On: 05/23/2019 14:10      Assessment/Plan Active Problems:   GI bleed    #1 lower GI bleed-rule out diverticulitis AV malformations mass/malignancy.  Patient has a history of diverticulitis.  He has had blood transfusions in the past.  We will keep him on clear liquids.  ED physician contacted lab our GI for consult for possible colonoscopy.  Repeat H&H this evening and again tomorrow.  Transfuse to keep his hemoglobin above 8.  Hold aspirin.  #2 ESRD failed kidney transplant has a fistula in preparation for dialysis when needed.  His creatinine is at baseline now.  Creatinine is 3.12.  Continue prednisone 5 mg daily.  Continue sodium bicarbonate.  #3 hypertension continue Lopressor hold Lasix with GI bleed.  #4 hyperlipidemia continue Lipitor.  #5 anemia of chronic disease I will check anemia panel he is hemoglobin was 7.24 December 2018.  He is on iron supplementation.  #6 gout continue allopurinol  #7 mild thrombocytopenia follow.  Severity of Illness: The appropriate patient status for this patient is INPATIENT. Inpatient status is judged to be reasonable and necessary in order to provide the required intensity of service to ensure the patient's safety. The patient's presenting symptoms, physical exam findings, and initial radiographic and laboratory data in the context of their chronic comorbidities is felt to place them at high risk for  further clinical deterioration. Furthermore, it is not anticipated that the patient will be medically stable for discharge from the hospital within 2 midnights of admission. The following factors support the patient status of inpatient.   " The patient's presenting symptoms include rectal bleeding painless. " The worrisome physical exam findings include anemia, maroon stools " The initial radiographic and laboratory data are worrisome because of anemia " The chronic co-morbidities include ESRD hypertension hyperlipidemia gout   * I certify that at the point of admission it is my clinical judgment that the patient will require inpatient hospital care spanning beyond 2 midnights from the point of admission due to high intensity of service, high risk for further deterioration and high frequency of surveillance required.*    Estimated body mass index is 23.65 kg/m as calculated from the following:   Height as of this encounter: 5\' 7"  (1.702 m).   Weight as of this encounter: 68.5 kg.   DVT prophylaxis: SCD Code Status: DO NOT RESUSCITATE Family Communication: None Disposition Plan: Pending clinical improvement Consults called: ED physician discussed with lab our GI Admission status: Inpatient   Georgette Shell MD [facility]  If 7PM-7AM, please contact night-coverage [email or web address] Password [email or web address]  05/23/2019, 3:22 PM

## 2019-05-23 NOTE — ED Provider Notes (Signed)
[facility] DEPT Provider Note   CSN: [id number] Arrival date & time: 05/23/19  1031     History   Chief Complaint Chief Complaint  Patient presents with  . GI Bleeding    HPI Caleb Hicks is a 78 y.o. male.     HPI  78 year old male history of AAA, anemia, status post kidney transplant with failure, end-stage renal disease, gout, hyperlipidemia, hypertension presents today with rectal bleeding.  He reports that at 6 AM he had a loose bloody stool.  A total of 6 loose bloody stools.  He denies any abdominal pain, nausea, vomiting, or history pain.  Although patient's history states diverticulitis, patient does not recall seeing any gastroenterologist.  He states he did have a colonoscopy many years ago.  He is followed by Dr.Deterding doing for nephrology.  He has had a AV fistula placed in his left upper arm last summer in preparation for probable beginning of dialysis.  He denies any Covid exposure, nasal congestion, cough, or signs or symptoms of Covid.  Past Medical History:  Diagnosis Date  . AAA (abdominal aortic aneurysm) ([place]): 4 cm 03/24/2017   3.5X 3. 5 CM PER 10-6 ABDOMINAL CT FOLOOWED EVRY 2 YEARS   . Abdominal aneurysm ([place])   . Acute kidney injury superimposed on CKD [facility]): Stage 4 03/24/2017  . Anemia   . Arthritis   . Cancer ([place])    skin cancer - basal cell  . Diverticulitis 03/2017   1 UNIT BLOOD GIVEN  . Gout   . Hepatic cyst: Solitary 03/24/2017  . Hepatitis    Hepatitis A  . Hydrocele sac   . Hyperlipidemia 03/24/2017  . Hypertension   . Hyperuricemia 03/24/2017  . 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 Pre-diabetes    Pt states he has "high blood sugar" but is not diabetic or pre-diabetic. He states he hasn't had an A1C done. He does take Januvia daily.  . 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 DM2 (diabetes mellitus, type 2) ([place]) 12/26/2018   . CKD (chronic kidney disease) stage 4, GFR 15-29 ml/min (HCC) 04/24/2018  . Diverticulosis with recurrent diverticulitis 04/24/2018  . Iron deficiency anemia 03/25/2017  . Low vitamin B12 level 03/25/2017  . HTN (hypertension) 03/24/2017  . AAA [facility]): 4 cm - repeat 10/2019 03/24/2017  . IgA nephropathy 03/24/2017  . Hyperlipidemia 03/24/2017  . 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 Hyperuricemia 03/24/2017  . Hepatic cyst: Solitary 03/24/2017  . Renal cyst 03/24/2017  . 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 AV FISTULA PLACEMENT Left 11/12/2018   Procedure: BRACHIOBASILIC ARTERIOVENOUS (AV) FISTULA CREATION LEFT ARM;  Surgeon: Waynetta Sandy, MD;  Location: [facility];  Service: Vascular;  Laterality: Left;  . AV FISTULA PLACEMENT Left 12/26/2018   Procedure: Insertion Of Arteriovenous (Av) Gore-Tex Graft Left Arm;  Surgeon: Waynetta Sandy, MD;  Location: [facility];  Service: Vascular;  Laterality: Left;  . COLONOSCOPY    . CYST REMOVED FROM SPINE  YRS AGO  . HERNIA REPAIR     SEVERAL  . HYDROCELE EXCISION Bilateral 06/04/2017   Procedure: BILATERAL HYDROCELECTOMY ADULT;  Surgeon: Lucas Mallow, MD;  Location[facility];  Service: Urology;  Laterality: Bilateral;  . KIDNEY TRANSPLANT    . PILIODINAL CYST  40-50 YRS AGO  Home Medications    Prior to Admission medications   Medication Sig Start Date End Date Taking? Authorizing Provider  aspirin 325 MG EC tablet Take 325 mg by mouth daily.    [provider]  atorvastatin (LIPITOR) 40 MG tablet Take 1 tablet (40 mg total) by mouth daily. 05/14/18   [name], MD  calcitRIOL (ROCALTROL) 0.25 MCG capsule Take 1 capsule (0.25 mcg total) by mouth daily. Patient taking differently: Take 0.25 mcg by mouth every other day.  11/24/17   Charlynne Cousins, MD  calcium carbonate (OS-CAL - DOSED  IN MG OF ELEMENTAL CALCIUM) 1250 (500 Ca) MG tablet Take 1-2 tablets by mouth See admin instructions. 2 tablets in the morning, and 1 tablet in the evening    [provider]  Ferrous Sulfate (IRON PO) Take 1 tablet by mouth 2 (two) times a day.    [provider]  furosemide (LASIX) 40 MG tablet Take 1 tablet (40 mg total) by mouth daily. 11/27/17   Charlynne Cousins, MD  JANUVIA 25 MG tablet TAKE 1 TABLET(25 MG) BY MOUTH DAILY 04/14/19   [name], MD  MAGNESIUM-OXIDE 400 (241.3 Mg) MG tablet Take 1 tablet by mouth 2 (two) times a day. 12/19/18   [provider]  metoprolol tartrate (LOPRESSOR) 50 MG tablet Take 50 mg by mouth 2 (two) times daily. 12/25/16   [provider]  NEORAL 25 MG capsule Take 2 tablets by mouth 2 (two) times a day. 10/25/18   [provider]  predniSONE (DELTASONE) 5 MG tablet Take 5 mg by mouth daily. 03/16/17   [provider]  senna-docusate (SENOKOT-S) 8.6-50 MG tablet Take 1 tablet by mouth at bedtime. Patient taking differently: Take 1 tablet by mouth at bedtime as needed for moderate constipation.  03/28/17   Eugenie Filler, MD  sodium bicarbonate 650 MG tablet Take 1,300 mg by mouth 2 (two) times daily.    [provider]    Family History Family History  Problem Relation Age of Onset  . Heart attack Father   . AAA (abdominal aortic aneurysm) Brother   . Cerebral aneurysm Brother     Social History Social History   Tobacco Use  . Smoking status: Former Smoker    Packs/day: 2.00    Years: 20.00    Pack years: 40.00    Types: Cigarettes  . Smokeless tobacco: Never Used  . Tobacco comment: 1984  Substance Use Topics  . Alcohol use: No    Frequency: Never  . Drug use: No     Allergies   Norvasc [amlodipine besylate] and Tape   Review of Systems Review of Systems   Physical Exam Updated Vital Signs BP (!) 174/53 (BP Location: Right Arm)   Pulse (!) 55   Temp (!) 97.5 F  (36.4 C) (Oral)   Resp 16   Ht 1.702 m (5\' 7" )   Wt 68.5 kg   SpO2 100%   BMI 23.65 kg/m   Physical Exam Vitals signs and nursing note reviewed.  Constitutional:      Appearance: Normal appearance.  HENT:     Head: Normocephalic.     Right Ear: External ear normal.     Left Ear: External ear normal.     Nose: Nose normal.  Eyes:     Extraocular Movements: Extraocular movements intact.     Pupils: Pupils are equal, round, and reactive to light.  Neck:     Musculoskeletal: Normal range  of motion and neck supple.  Cardiovascular:     Rate and Rhythm: Normal rate and regular rhythm.     Pulses: Normal pulses.  Pulmonary:     Effort: Pulmonary effort is normal.     Breath sounds: Normal breath sounds.  Abdominal:     General: Abdomen is flat.     Palpations: Abdomen is soft.  Genitourinary:    Comments: No masses noted on rectal exam Maroon stool noted Musculoskeletal: Normal range of motion.        General: No swelling or tenderness.     Comments: Mild swelling left foot with mild erythema around ankle- patient states unchanged from baseline  Skin:    General: Skin is warm and dry.     Capillary Refill: Capillary refill takes less than 2 seconds.     Coloration: Skin is not jaundiced or pale.  Neurological:     General: No focal deficit present.     Mental Status: He is alert and oriented to person, place, and time.  Psychiatric:        Mood and Affect: Mood normal.      ED Treatments / Results  Labs (all labs ordered are listed, but only abnormal results are displayed) Labs Reviewed  CBC - Abnormal; Notable for the following components:      Result Value   RBC 3.47 (*)    Hemoglobin 10.7 (*)    HCT 36.1 (*)    MCV 104.0 (*)    MCHC 29.6 (*)    Platelets 136 (*)    All other components within normal limits  POC OCCULT BLOOD, ED - Abnormal; Notable for the following components:   Fecal Occult Bld POSITIVE (*)    All other components within normal limits   COMPREHENSIVE METABOLIC PANEL  TYPE AND SCREEN    EKG EKG Interpretation  Date/Time:  Friday May 23 2019 13:22:37 EST Ventricular Rate:  54 PR Interval:    QRS Duration: 86 QT Interval:  428 QTC Calculation: 406 R Axis:   -9 Text Interpretation: Sinus rhythm no acute changes Confirmed by Pattricia Boss [phone]) on 05/23/2019 2:08:17 PM   Radiology No results found.  Procedures .Critical Care Performed by: Pattricia Boss, MD Authorized by: Pattricia Boss, MD   Critical care provider statement:    Critical care time (minutes):  45   Critical care end time:  05/23/2019 2:27 PM   Critical care was necessary to treat or prevent imminent or life-threatening deterioration of the following conditions: GI bleeding.   Critical care was time spent personally by me on the following activities:  Discussions with consultants, evaluation of patient's response to treatment, examination of patient, ordering and performing treatments and interventions, ordering and review of laboratory studies, ordering and review of radiographic studies, pulse oximetry, re-evaluation of patient's condition, obtaining history from patient or surrogate and review of old charts   (including critical care time)  Medications Ordered in ED Medications - No data to display   Initial Impression / Assessment and Plan / ED Course  I have reviewed the triage vital signs and the nursing notes.  Pertinent labs & imaging results that were available during my care of the patient were reviewed by me and considered in my medical decision making (see chart for details).       78 year old male end-stage renal disease status post failed transplant not on dialysis yet presents today with rectal bleeding.  Hemoglobin has decreased slightly from first prior which is 1.3 and  today is 10.7.  Otherwise the patient appears hemodynamically stable.  Discussed with [facility] gastroenterology.  Plan consult to hospitalist for admission  Discussed with Dr. Kathreen Cosier and will see for admission Final Clinical Impressions(s) / ED Diagnoses   Final diagnoses:  Rectal bleeding    ED Discharge Orders    None       Pattricia Boss, MD 05/23/19 1428

## 2019-05-24 ENCOUNTER — Inpatient Hospital Stay (HOSPITAL_COMMUNITY): Payer: Medicare Other | Admitting: Certified Registered"

## 2019-05-24 ENCOUNTER — Encounter (HOSPITAL_COMMUNITY)
Admission: EM | Disposition: A | Discharge status: Home / Self Care | Payer: Self-pay | Source: Home / Self Care | Attending: Internal Medicine

## 2019-05-24 ENCOUNTER — Encounter (HOSPITAL_COMMUNITY): Payer: Self-pay | Admitting: Certified Registered"

## 2019-05-24 DIAGNOSIS — K635 Polyp of colon: Secondary | ICD-10-CM | POA: Diagnosis not present

## 2019-05-24 DIAGNOSIS — D125 Benign neoplasm of sigmoid colon: Secondary | ICD-10-CM

## 2019-05-24 HISTORY — PX: HEMOSTASIS CLIP PLACEMENT: SHX6857

## 2019-05-24 HISTORY — PX: COLONOSCOPY: SHX5424

## 2019-05-24 HISTORY — PX: POLYPECTOMY: SHX5525

## 2019-05-24 LAB — BASIC METABOLIC PANEL
Anion gap: 15 (ref 5–15)
BUN: 55 mg/dL — ABNORMAL HIGH (ref 8–23)
CO2: 23 mmol/L (ref 22–32)
Calcium: 9 mg/dL (ref 8.9–10.3)
Chloride: 104 mmol/L (ref 98–111)
Creatinine, Ser: 3.24 mg/dL — ABNORMAL HIGH (ref 0.61–1.24)
GFR calc Af Amer: 20 mL/min — ABNORMAL LOW (ref >60)
GFR calc non Af Amer: 17 mL/min — ABNORMAL LOW (ref >60)
Glucose, Bld: 182 mg/dL — ABNORMAL HIGH (ref 70–99)
Potassium: 4.9 mmol/L (ref 3.5–5.1)
Sodium: 142 mmol/L (ref 135–145)

## 2019-05-24 LAB — CBC
HCT: 29.6 % — ABNORMAL LOW (ref 39.0–52.0)
Hemoglobin: 8.9 g/dL — ABNORMAL LOW (ref 13.0–17.0)
MCH: 30.2 pg (ref 26.0–34.0)
MCHC: 30.1 g/dL (ref 30.0–36.0)
MCV: 100.3 fL — ABNORMAL HIGH (ref 80.0–100.0)
Platelets: 210 10*3/uL (ref 150–400)
RBC: 2.95 MIL/uL — ABNORMAL LOW (ref 4.22–5.81)
RDW: 14.6 % (ref 11.5–15.5)
WBC: 11.4 10*3/uL — ABNORMAL HIGH (ref 4.0–10.5)
nRBC: 0 % (ref 0.0–0.2)

## 2019-05-24 SURGERY — COLONOSCOPY
Anesthesia: Monitor Anesthesia Care

## 2019-05-24 MED ORDER — PROPOFOL 10 MG/ML IV BOLUS
INTRAVENOUS | Status: AC
  Filled 2019-05-24: qty 20

## 2019-05-24 MED ORDER — CYCLOSPORINE MODIFIED (NEORAL) 25 MG PO CAPS
50.0000 mg | ORAL_CAPSULE | Freq: Two times a day (BID) | ORAL | Status: DC
  Administered 2019-05-24 – 2019-05-27 (×7): 50 mg via ORAL
  Filled 2019-05-24 (×7): qty 2

## 2019-05-24 MED ORDER — PROPOFOL 10 MG/ML IV BOLUS
INTRAVENOUS | Status: DC | PRN
  Administered 2019-05-24: 10 mg via INTRAVENOUS
  Administered 2019-05-24: 20 mg via INTRAVENOUS
  Administered 2019-05-24 (×2): 10 mg via INTRAVENOUS
  Administered 2019-05-24: 40 mg via INTRAVENOUS
  Administered 2019-05-24: 10 mg via INTRAVENOUS
  Administered 2019-05-24: 20 mg via INTRAVENOUS

## 2019-05-24 MED ORDER — SODIUM CHLORIDE 0.9 % IV SOLN
INTRAVENOUS | Status: DC
  Administered 2019-05-24: 10:00:00 via INTRAVENOUS

## 2019-05-24 MED ORDER — LIDOCAINE 2% (20 MG/ML) 5 ML SYRINGE
INTRAMUSCULAR | Status: DC | PRN
  Administered 2019-05-24: 40 mg via INTRAVENOUS

## 2019-05-24 NOTE — Interval H&P Note (Signed)
History and Physical Interval Note:  05/24/2019 9:39 AM  Caleb Hicks  has presented today for surgery, with the diagnosis of lower GI bleed.  The various methods of treatment have been discussed with the patient and family. After consideration of risks, benefits and other options for treatment, the patient has consented to  Procedure(s): COLONOSCOPY (N/A) as a surgical intervention.  The patient's history has been reviewed, patient examined, no change in status, stable for surgery.  I have reviewed the patient's chart and labs.  Questions were answered to the patient's satisfaction.     [place]

## 2019-05-24 NOTE — Op Note (Signed)
[facility] Patient Name: Caleb Hicks Procedure Date: 05/24/2019 MRN: [id number] Attending MD: Carlota Raspberry. Havery Moros , MD Date of Birth: 28-Oct-1940 CSN: [id number] Age: 78 Admit Type: Inpatient Procedure:                Colonoscopy Indications:              Hematochezia / lower GI bleed - history of                            diverticulosis / diverticulitis, GI bleeding in                            2018, last colonoscopy 10 years ago - patient                            stopped bleeding with the bowel prep Providers:                Remo Lipps P. Havery Moros, MD, Vista Lawman, RN, Laverda Sorenson, Technician, Glenis Smoker, CRNA Referring MD:              Medicines:                Monitored Anesthesia Care Complications:            No immediate complications. Estimated blood loss:                            Minimal. Estimated Blood Loss:     Estimated blood loss was minimal. Procedure:                Pre-Anesthesia Assessment:                           - Prior to the procedure, a History and Physical                            was performed, and patient medications and                            allergies were reviewed. The patient's tolerance of                            previous anesthesia was also reviewed. The risks                            and benefits of the procedure and the sedation                            options and risks were discussed with the patient.                            All questions were answered, and informed consent  was obtained. Prior Anticoagulants: The patient has                            taken no previous anticoagulant or antiplatelet                            agents. ASA Grade Assessment: III - A patient with                            severe systemic disease. After reviewing the risks                            and benefits, the patient was deemed in   satisfactory condition to undergo the procedure.                           After obtaining informed consent, the colonoscope                            was passed under direct vision. Throughout the                            procedure, the patient's blood pressure, pulse, and                            oxygen saturations were monitored continuously. The                            CF-HQ190L ([id number]) Olympus colonoscope was                            introduced through the anus and advanced to the the                            terminal ileum, with identification of the                            appendiceal orifice and IC valve. The colonoscopy                            was performed without difficulty. The patient                            tolerated the procedure well. The quality of the                            bowel preparation was adequate. The terminal ileum,                            ileocecal valve, appendiceal orifice, and rectum                            were photographed. Scope In: 10:17:54 AM Scope Out: 10:39:39 AM Scope Withdrawal Time: 0 hours 16 minutes 23  seconds  Total Procedure Duration: 0 hours 21 minutes 45 seconds  Findings:      The perianal and digital rectal examinations were normal.      The terminal ileum appeared normal.      A 3 mm polyp was found in the transverse colon. The polyp was sessile.       The polyp was removed with a cold snare. Resection and retrieval were       complete.      Many small and large-mouthed diverticula were found in the recto-sigmoid       colon, sigmoid colon, descending colon and cecum. There was superficial       erythema in the sigmoid colon associated with diverticulosis. No       bleeding noted or stigmata of bleeding.      A 6 to 7 mm polyp was found in the sigmoid colon. The polyp was       pedunculated and quite inflamed. The polyp was removed with a hot snare.       Resection and retrieval were complete. Given the  patient's recent       bleeding, one hemostatic clip was successfully placed acorss the defect       to avoid confounding the situation if rebleeding occurs.      The exam was otherwise without abnormality. Impression:               - The examined portion of the ileum was normal.                           - One 3 mm polyp in the transverse colon, removed                            with a cold snare. Resected and retrieved.                           - Severe diverticulosis in the recto-sigmoid colon,                            in the sigmoid colon, in the descending colon and                            in the cecum.                           - One 6 to 7 mm polyp in the sigmoid colon, removed                            with a hot snare. Resected and retrieved. Clip was                            placed prophylactically given the patient's recent                            bleeding.                           - The examination was otherwise normal.  Suspect the patient most likely had a diverticular                            bleed from the sigmoid colon (highest burden of                            diverticuli), no active bleeding noted or stigmata                            of bleeding. Moderate Sedation:      No moderate sedation, case performed with MAC Recommendation:           - Return patient to hospital ward for ongoing care.                           - Advance diet as tolerated.                           - Continue present medications.                           - Await pathology results.                           - if no further bleeding today can be discharged                            home tomorrow                           - call with questions Procedure Code(s):        --- Professional ---                           [phone], Colonoscopy, flexible; with removal of                            tumor(s), polyp(s), or other lesion(s) by snare                             technique Diagnosis Code(s):        --- Professional ---                           K63.5, Polyp of colon                           K92.1, Melena (includes Hematochezia)                           K57.30, Diverticulosis of large intestine without                            perforation or abscess without bleeding CPT copyright 2019 [employer]. All rights reserved. The codes documented in this report are preliminary and upon coder review may  be revised to meet current compliance requirements. Remo Lipps P.  Havery Moros, MD 05/24/2019 10:53:58 AM This report has been signed electronically. Number of Addenda: 0

## 2019-05-24 NOTE — Interval H&P Note (Signed)
History and Physical Interval Note:  05/24/2019 9:39 AM  Caleb Hicks  has presented today for surgery, with the diagnosis of lower GI bleed.  The various methods of treatment have been discussed with the patient and family. After consideration of risks, benefits and other options for treatment, the patient has consented to  Procedure(s): COLONOSCOPY (N/A) as a surgical intervention.  The patient's history has been reviewed, patient examined, no change in status, stable for surgery.  I have reviewed the patient's chart and labs.  Questions were answered to the patient's satisfaction.     [place]

## 2019-05-24 NOTE — Progress Notes (Signed)
PROGRESS NOTE    Caleb Hicks  XTK:[id number] DOB: March 09, 1941 DOA: 05/23/2019 PCP: [name], MD   Brief Narrative:  Patient is a 78 year old male with history of failed kidney transplant, ESRD, gout, hypertension, hyperlipidemia, anemia, abdominal aortic aneurysm who presented from home with complaints of rectal bleeding.  He reported 6 loose bloody bowel movements at home.  Never had EGD in the past.  He was on aspirin 325 mg at home.  Patient was admitted for the management of hematochezia.  GI consulted.  Underwent colonoscopy today without finding of any active bleeding.  Plan is to continue monitoring H&H and discharge tomorrow if remains stable.  Assessment & Plan:   Active Problems:   Rectal bleeding   Lower GI bleed   ESRD (end stage renal disease) (HCC)   Benign neoplasm of sigmoid colon   Hematochezia/lower GI bleed: Underwent colonoscopy this morning.  Suspected diverticular bleed.  No bleeding source seen, no active bleeding.  GI following.  Will check CBC tomorrow.  Plan for discharge tomorrow if hemoglobin remains stable.  ESRD/failed kidney transplant: Follows with nephrology, Dr. [name].  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  Baseline creatinine around 3-4.  Currently kidney function at baseline.  Continue prednisone, cyclosporine.  Continue sodium bicarb.  Hypertension: Currently blood pressure stable.  Will resume his home medicines on discharge.  Hyperlipidemia: Continue Lipitor  Anemia of chronic disease: Most likely associated  with ESRD.  Hemoglobin dropped from 10.7-8.9 today.  No active bleeding at present.  Check CBC tomorrow.  Gout: Continue allopurinol  Mild thrombocytopenia: Currently stable.  Continue to monitor            DVT prophylaxis: SCD Code Status: DNR Family Communication: None present at the bedside Disposition Plan: Home tomorrow   Consultants: GI  Procedures: Colonoscopy  Antimicrobials:  Anti-infectives (From  admission, onward)   None      Subjective:  Patient seen and examined the bedside this morning.  Hemodynamically stable.  Comfortable but denies any nausea or vomiting or abdominal pain.  No bloody bowel movement after he presented here.  Objective: Vitals:   05/24/19 1049 05/24/19 1103 05/24/19 1112 05/24/19 1129  BP: (!) 187/45 (!) 131/35 (!) 137/54 (!) 158/57  Pulse: 69 65 68 66  Resp: (!) 22 17 18 16   Temp:    97.7 F (36.5 C)  TempSrc:    Oral  SpO2: 99% 98% 98% 100%  Weight:      Height:        Intake/Output Summary (Last 24 hours) at 05/24/2019 1324 Last data filed at 05/24/2019 1200 Gross per 24 hour  Intake 420 ml  Output 0 ml  Net 420 ml   Filed Weights   05/23/19 1044  Weight: 68.5 kg    Examination:  General exam: Appears calm and comfortable ,Not in distress,average built HEENT:PERRL,Oral mucosa moist, Ear/Nose normal on gross exam Respiratory system: Bilateral equal air entry, normal vesicular breath sounds, no wheezes or crackles  Cardiovascular system: S1 & S2 heard, RRR. No JVD, murmurs, rubs, gallops or clicks. No pedal edema. Gastrointestinal system: Abdomen is nondistended, soft and nontender. No organomegaly or masses felt. Normal bowel sounds heard. Central nervous system: Alert and oriented. No focal neurological deficits. Extremities: No edema, no clubbing ,no cyanosis, distal peripheral pulses palpable.AV graft on the left arm Skin: No rashes, lesions or ulcers,no icterus ,no pallor     Data Reviewed: I have personally reviewed following labs and imaging studies  CBC:  Recent Labs  Lab 05/23/19 1048 05/23/19 1537 05/24/19 0457  WBC 10.3  --  11.4*  HGB 10.7* 10.3* 8.9*  HCT 36.1* 33.9* 29.6*  MCV 104.0*  --  100.3*  PLT 136*  --  161   Basic Metabolic Panel: Recent Labs  Lab 05/23/19 1316 05/24/19 0457  NA 141 142  K 4.5 4.9  CL 104 104  CO2 26 23  GLUCOSE 117* 182*  BUN 54* 55*  CREATININE 3.12* 3.24*  CALCIUM 9.6 9.0    GFR: Estimated Creatinine Clearance: 17.3 mL/min (A) (by C-G formula based on SCr of 3.24 mg/dL (H)). Liver Function Tests: Recent Labs  Lab 05/23/19 1316  AST 10*  ALT 8  ALKPHOS 55  BILITOT 0.7  PROT 5.4*  ALBUMIN 2.8*   No results for input(s): LIPASE, AMYLASE in the last 168 hours. No results for input(s): AMMONIA in the last 168 hours. Coagulation Profile: No results for input(s): INR, PROTIME in the last 168 hours. Cardiac Enzymes: No results for input(s): CKTOTAL, CKMB, CKMBINDEX, TROPONINI in the last 168 hours. BNP (last 3 results) No results for input(s): PROBNP in the last 8760 hours. HbA1C: No results for input(s): HGBA1C in the last 72 hours. CBG: No results for input(s): GLUCAP in the last 168 hours. Lipid Profile: No results for input(s): CHOL, HDL, LDLCALC, TRIG, CHOLHDL, LDLDIRECT in the last 72 hours. Thyroid Function Tests: No results for input(s): TSH, T4TOTAL, FREET4, T3FREE, THYROIDAB in the last 72 hours. Anemia Panel: No results for input(s): VITAMINB12, FOLATE, FERRITIN, TIBC, IRON, RETICCTPCT in the last 72 hours. Sepsis Labs: No results for input(s): PROCALCITON, LATICACIDVEN in the last 168 hours.  Recent Results (from the past 240 hour(s))  SARS CORONAVIRUS 2 (TAT 6-24 HRS) Nasopharyngeal Nasopharyngeal Swab     Status: None   Collection Time: 05/23/19  2:51 PM   Specimen: Nasopharyngeal Swab  Result Value Ref Range Status   SARS Coronavirus 2 NEGATIVE NEGATIVE Final    Comment: (NOTE) SARS-CoV-2 target nucleic acids are NOT DETECTED. The SARS-CoV-2 RNA is generally detectable in upper and lower respiratory specimens during the acute phase of infection. Negative results do not preclude SARS-CoV-2 infection, do not rule out co-infections with other pathogens, and should not be used as the sole basis for treatment or other patient management decisions. Negative results must be combined with clinical observations, patient history, and  epidemiological information. The expected result is Negative. Fact Sheet for Patients: SugarRoll.be Fact Sheet for Healthcare Providers: https://www.woods-mathews.com/ This test is not yet approved or cleared by the [place] FDA and  has been authorized for detection and/or diagnosis of SARS-CoV-2 by FDA under an Emergency Use Authorization (EUA). This EUA will remain  in effect (meaning this test can be used) for the duration of the COVID-19 declaration under Section 56 4(b)(1) of the Act, 21 U.S.C. section 360bbb-3(b)(1), unless the authorization is terminated or revoked sooner. Performed at [facility], [place] [address] [place], [place] [address]          Radiology Studies: Dg Chest Port 1 View  Result Date: 05/23/2019 CLINICAL DATA:  GI bleed EXAM: PORTABLE CHEST 1 VIEW COMPARISON:  11/17/2017 FINDINGS: The patient's neck and chin are obscuring the apices. No focal opacity or pleural effusion. Normal heart size. Aortic atherosclerosis. No pneumothorax. Clips in the left axillary region. IMPRESSION: No active disease. Electronically Signed   By: Donavan Foil M.D.   On: 05/23/2019 14:10        Scheduled Meds: .  allopurinol  200 mg Oral Daily  . atorvastatin  40 mg Oral Daily  . calcitRIOL  0.25 mcg Oral Daily  . metoprolol tartrate  50 mg Oral BID  . pantoprazole (PROTONIX) IV  40 mg Intravenous Q12H  . predniSONE  5 mg Oral Daily  . sodium bicarbonate  1,300 mg Oral BID   Continuous Infusions:   LOS: 1 day    Time spent: 35 mins.More than 50% of that time was spent in counseling and/or coordination of care.      Shelly Coss, MD [facility] Pager [phone]  If 7PM-7AM, please contact night-coverage [email or web address] Password [email or web address] 05/24/2019, 1:24 PM

## 2019-05-24 NOTE — Anesthesia Procedure Notes (Signed)
Procedure Name: MAC Date/Time: 05/24/2019 10:10 AM Performed by: Cynda Familia, CRNA Pre-anesthesia Checklist: Patient identified, Emergency Drugs available, Suction available, Patient being monitored and Timeout performed Patient Re-evaluated:Patient Re-evaluated prior to induction Oxygen Delivery Method: Simple face mask Placement Confirmation: positive ETCO2 and breath sounds checked- equal and bilateral Dental Injury: Teeth and Oropharynx as per pre-operative assessment

## 2019-05-24 NOTE — Transfer of Care (Signed)
Immediate Anesthesia Transfer of Care Note  Patient: Caleb Hicks  Procedure(s) Performed: COLONOSCOPY (N/A ) HEMOSTASIS CLIP PLACEMENT POLYPECTOMY  Patient Location: PACU and Endoscopy Unit  Anesthesia Type:MAC  Level of Consciousness: awake and alert   Airway & Oxygen Therapy: Patient Spontanous Breathing and Patient connected to face mask oxygen  Post-op Assessment: Report given to RN and Post -op Vital signs reviewed and stable  Post vital signs: Reviewed and stable  Last Vitals:  Vitals Value Taken Time  BP    Temp    Pulse    Resp    SpO2      Last Pain:  Vitals:   05/24/19 0935  TempSrc: Oral  PainSc: 0-No pain         Complications: No apparent anesthesia complications

## 2019-05-24 NOTE — Anesthesia Preprocedure Evaluation (Signed)
Anesthesia Evaluation  Patient identified by MRN, date of birth, ID band Patient awake    Reviewed: Allergy & Precautions, NPO status , Patient's Chart, lab work & pertinent test results, reviewed documented beta blocker date and time   History of Anesthesia Complications Negative for: history of anesthetic complications  Airway Mallampati: II  TM Distance: >3 FB Neck ROM: Full    Dental  (+) Teeth Intact   Pulmonary neg pulmonary ROS, former smoker,    Pulmonary exam normal        Cardiovascular hypertension, Pt. on medications and Pt. on home beta blockers Normal cardiovascular exam  AAA   Neuro/Psych negative neurological ROS  negative psych ROS   GI/Hepatic negative GI ROS, Neg liver ROS,   Endo/Other  negative endocrine ROS  Renal/GU ESRFRenal diseaseS/p kidney transplant 1997, now with chronic graft failure, not currently requiring dialysis  negative genitourinary   Musculoskeletal  (+) Arthritis ,   Abdominal   Peds  Hematology  (+) anemia ,   Anesthesia Other Findings EF 65-70%, grade 1 dd, mild AI  Reproductive/Obstetrics                            Anesthesia Physical Anesthesia Plan  ASA: III and emergent  Anesthesia Plan: MAC   Post-op Pain Management:    Induction: Intravenous  PONV Risk Score and Plan: Propofol infusion, TIVA and Treatment may vary due to age or medical condition  Airway Management Planned: Natural Airway, Nasal Cannula and Simple Face Mask  Additional Equipment: None  Intra-op Plan:   Post-operative Plan:   Informed Consent: I have reviewed the patients History and Physical, chart, labs and discussed the procedure including the risks, benefits and alternatives for the proposed anesthesia with the patient or authorized representative who has indicated his/her understanding and acceptance.       Plan Discussed with:   Anesthesia Plan  Comments:         Anesthesia Quick Evaluation

## 2019-05-24 NOTE — Anesthesia Postprocedure Evaluation (Signed)
Anesthesia Post Note  Patient: Caleb Hicks  Procedure(s) Performed: COLONOSCOPY (N/A ) HEMOSTASIS CLIP PLACEMENT POLYPECTOMY     Patient location during evaluation: Endoscopy Anesthesia Type: MAC Level of consciousness: awake and alert Pain management: pain level controlled Vital Signs Assessment: post-procedure vital signs reviewed and stable Respiratory status: spontaneous breathing, nonlabored ventilation and respiratory function stable Cardiovascular status: blood pressure returned to baseline and stable Postop Assessment: no apparent nausea or vomiting Anesthetic complications: no    Last Vitals:  Vitals:   05/24/19 0935 05/24/19 1049  BP: (!) 145/34 (!) 187/45  Pulse: 75 69  Resp: 19 (!) 22  Temp: 36.6 C   SpO2: 100% 99%    Last Pain:  Vitals:   05/24/19 1049  TempSrc:   PainSc: 0-No pain                 Lidia Collum

## 2019-05-25 DIAGNOSIS — K5731 Diverticulosis of large intestine without perforation or abscess with bleeding: Secondary | ICD-10-CM | POA: Diagnosis not present

## 2019-05-25 DIAGNOSIS — K922 Gastrointestinal hemorrhage, unspecified: Secondary | ICD-10-CM | POA: Diagnosis not present

## 2019-05-25 LAB — CBC WITH DIFFERENTIAL/PLATELET
Abs Immature Granulocytes: 0.04 10*3/uL (ref 0.00–0.07)
Basophils Absolute: 0 10*3/uL (ref 0.0–0.1)
Basophils Relative: 0 %
Eosinophils Absolute: 0.1 10*3/uL (ref 0.0–0.5)
Eosinophils Relative: 1 %
HCT: 21.2 % — ABNORMAL LOW (ref 39.0–52.0)
Hemoglobin: 6.5 g/dL — CL (ref 13.0–17.0)
Immature Granulocytes: 1 %
Lymphocytes Relative: 24 %
Lymphs Abs: 2 10*3/uL (ref 0.7–4.0)
MCH: 30.7 pg (ref 26.0–34.0)
MCHC: 30.7 g/dL (ref 30.0–36.0)
MCV: 100 fL (ref 80.0–100.0)
Monocytes Absolute: 0.6 10*3/uL (ref 0.1–1.0)
Monocytes Relative: 7 %
Neutro Abs: 5.7 10*3/uL (ref 1.7–7.7)
Neutrophils Relative %: 67 %
Platelets: 175 10*3/uL (ref 150–400)
RBC: 2.12 MIL/uL — ABNORMAL LOW (ref 4.22–5.81)
RDW: 14.9 % (ref 11.5–15.5)
WBC: 8.5 10*3/uL (ref 4.0–10.5)
nRBC: 0 % (ref 0.0–0.2)

## 2019-05-25 LAB — PREPARE RBC (CROSSMATCH)

## 2019-05-25 MED ORDER — SODIUM CHLORIDE 0.9% IV SOLUTION
Freq: Once | INTRAVENOUS | Status: AC
  Administered 2019-05-25: 13:00:00 via INTRAVENOUS

## 2019-05-25 NOTE — Progress Notes (Signed)
     [facility] Progress Note  CC:  GIB   Subjective:  Hgb 6.5 grams this AM from 8.9 grams yesterday.  Receiving 2 units PRBC's.  Reports 2 smaller volume bloody stools overnight, none this AM.  Colonoscopy 12/5 showed the following:  - The examined portion of the ileum was normal. - One 3 mm polyp in the transverse colon, removed with a cold snare. Resected and retrieved. - Severe diverticulosis in the recto-sigmoid colon, in the sigmoid colon, in the descending colon and in the cecum. - One 6 to 7 mm polyp in the sigmoid colon, removed with a hot snare. Resected and retrieved. Clip was placed prophylactically given the patient's recent bleeding. - The examination was otherwise normal. Suspect the patient most likely had a diverticular bleed from the sigmoid colon (highest burden of diverticuli), no active bleeding noted or stigmata of bleeding.  Objective:  Vital signs in last 24 hours: Temp:  [97.7 F (36.5 C)-98.8 F (37.1 C)] 98 F (36.7 C) (12/06 0447) Pulse Rate:  [63-71] 69 (12/06 0915) Resp:  [16-20] 18 (12/06 0447) BP: (136-158)/(51-62) 136/58 (12/06 0915) SpO2:  [100 %] 100 % (12/06 0447) Last BM Date: 05/24/19 General:  Alert, Well-developed, in NAD Heart:  Regular rate and rhythm; no murmurs Pulm:  CTAB.  No increased WOB. Abdomen:  Soft, non-distended.  BS present.  Non-tender. Extremities:  Without edema. Neurologic:  Alert and oriented x 4;  grossly normal neurologically. Psych:  Alert and cooperative. Normal mood and affect.  Intake/Output from previous day: 12/05 0701 - 12/06 0700 In: 540 [P.O.:240; I.V.:300] Out: 200 [Urine:200]  Lab Results: Recent Labs    05/23/19 1048 05/23/19 1537 05/24/19 0457 05/25/19 0514  WBC 10.3  --  11.4* 8.5  HGB 10.7* 10.3* 8.9* 6.5*  HCT 36.1* 33.9* 29.6* 21.2*  PLT 136*  --  210 175   BMET Recent Labs    05/23/19 1316 05/24/19 0457  NA 141 142  K 4.5 4.9  CL 104 104  CO2 26 23  GLUCOSE 117*  182*  BUN 54* 55*  CREATININE 3.12* 3.24*  CALCIUM 9.6 9.0   LFT Recent Labs    05/23/19 1316  PROT 5.4*  ALBUMIN 2.8*  AST 10*  ALT 8  ALKPHOS 55  BILITOT 0.7   Dg Chest Port 1 View  Result Date: 05/23/2019 CLINICAL DATA:  GI bleed EXAM: PORTABLE CHEST 1 VIEW COMPARISON:  11/17/2017 FINDINGS: The patient's neck and chin are obscuring the apices. No focal opacity or pleural effusion. Normal heart size. Aortic atherosclerosis. No pneumothorax. Clips in the left axillary region. IMPRESSION: No active disease. Electronically Signed   By: Donavan Foil M.D.   On: 05/23/2019 14:10   Assessment / Plan: *GIB:  Very likely diverticular in origin after colonoscopy with no other source on 12/5.  Has history of the same in 2018.  Bleeding had stopped during bowel prep, now with two smaller volume bloody stools overnight. *Anemia:  Has chronic anemia, likely at least in part due to his renal disease.  Hgb did drop from 8.9 grams to 6.5 grams overnight.  Receiving two units PRBC's.  -Monitor Hgb and transfuse further prn. -If re-bleeds briskly then needs NM tagged scan. -Would not advance past full liquids for now.   LOS: 2 days   Caleb Hicks.   05/25/2019, 11:25 AM

## 2019-05-25 NOTE — Progress Notes (Signed)
PROGRESS NOTE    Caleb Hicks  EHM:[id number] DOB: [birth year] DOA: 05/23/2019 PCP: [name], MD   Brief Narrative:  Patient is a 78 year old male with history of failed kidney transplant, ESRD, gout, hypertension, hyperlipidemia, anemia, abdominal aortic aneurysm who presented from home with complaints of rectal bleeding.  He reported 6 loose bloody bowel movements at home.  Never had EGD in the past.  He was on aspirin 325 mg at home.  Patient was admitted for the management of hematochezia.  GI consulted.  Underwent colonoscopy on 05/24/19  without finding of any active bleeding.  Patient reported 2 bowel movements which were bloody last night.  He said they were small amount.  His hemoglobin dropped to the range of 6.5 today.  He has been transfused with 2 units of PRBC  Assessment & Plan:   Active Problems:   Rectal bleeding   Lower GI bleed   ESRD (end stage renal disease) (HCC)   Benign neoplasm of sigmoid colon   Hematochezia/lower GI bleed: Underwent colonoscopy .  Suspected diverticular bleed.  No bleeding source seen, no active bleeding.  GI following.    Patient reported 2 bowel movements which were bloody last night.  He said they were small amount.  His hemoglobin dropped to the range of 6 today.  He has been transfused with 2 units of PRBC.Check CBC tomorrow.  ESRD/failed kidney transplant: Follows with nephrology, Dr. [name].  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  Baseline creatinine around 3-4.  Currently kidney function at baseline.  Continue prednisone, cyclosporine.  Continue sodium bicarb.  Hypertension: Currently blood pressure stable.  Will resume his home medicines on discharge.  Hyperlipidemia: Continue Lipitor  Normocytic anemia : Secondary to GI bleed, ESRD.  Being transfused with 2 units of PRBC today.  Gout: Continue allopurinol  Mild thrombocytopenia: Currently stable.  Continue to monitor         DVT prophylaxis: SCD Code Status: DNR  Family Communication: Called daughter on phone on 05/24/19 Disposition Plan: Home after GI clearance  Consultants: GI  Procedures: Colonoscopy  Antimicrobials:  Anti-infectives (From admission, onward)   None      Subjective:  Patient seen and examined the bedside this morning.  Hemodynamically stable.  He had 2 bloody bowel movements last night which were small to moderate size.  Denies any dizziness or lightheadedness.  Being transfused with 2 units of PRBC.  Objective: Vitals:   05/24/19 1550 05/24/19 2046 05/25/19 0447 05/25/19 0915  BP: 138/62 (!) 143/56 (!) 140/51 (!) 136/58  Pulse: 71 66 63 69  Resp: 18 20 18    Temp: 98 F (36.7 C) 98.8 F (37.1 C) 98 F (36.7 C)   TempSrc: Oral Oral Oral   SpO2: 100% 100% 100%   Weight:      Height:        Intake/Output Summary (Last 24 hours) at 05/25/2019 1058 Last data filed at 05/24/2019 1800 Gross per 24 hour  Intake 240 ml  Output 200 ml  Net 40 ml   Filed Weights   05/23/19 1044  Weight: 68.5 kg    Examination:  General exam: Appears calm and comfortable ,Not in distress,average built HEENT:PERRL,Oral mucosa moist, Ear/Nose normal on gross exam Respiratory system: Bilateral equal air entry, normal vesicular breath sounds, no wheezes or crackles  Cardiovascular system: S1 & S2 heard, RRR. No JVD, murmurs, rubs, gallops or clicks. No pedal edema. Gastrointestinal system: Abdomen is nondistended, soft and nontender. No organomegaly or masses  felt. Normal bowel sounds heard. Central nervous system: Alert and oriented. No focal neurological deficits. Extremities: No edema, no clubbing ,no cyanosis, distal peripheral pulses palpable.AV graft on the left arm Skin: No rashes, lesions or ulcers,no icterus ,no pallor     Data Reviewed: I have personally reviewed following labs and imaging studies  CBC: Recent Labs  Lab 05/23/19 1048 05/23/19 1537 05/24/19 0457 05/25/19 0514  WBC 10.3  --  11.4* 8.5  NEUTROABS   --   --   --  5.7  HGB 10.7* 10.3* 8.9* 6.5*  HCT 36.1* 33.9* 29.6* 21.2*  MCV 104.0*  --  100.3* 100.0  PLT 136*  --  210 254   Basic Metabolic Panel: Recent Labs  Lab 05/23/19 1316 05/24/19 0457  NA 141 142  K 4.5 4.9  CL 104 104  CO2 26 23  GLUCOSE 117* 182*  BUN 54* 55*  CREATININE 3.12* 3.24*  CALCIUM 9.6 9.0   GFR: Estimated Creatinine Clearance: 17.3 mL/min (A) (by C-G formula based on SCr of 3.24 mg/dL (H)). Liver Function Tests: Recent Labs  Lab 05/23/19 1316  AST 10*  ALT 8  ALKPHOS 55  BILITOT 0.7  PROT 5.4*  ALBUMIN 2.8*   No results for input(s): LIPASE, AMYLASE in the last 168 hours. No results for input(s): AMMONIA in the last 168 hours. Coagulation Profile: No results for input(s): INR, PROTIME in the last 168 hours. Cardiac Enzymes: No results for input(s): CKTOTAL, CKMB, CKMBINDEX, TROPONINI in the last 168 hours. BNP (last 3 results) No results for input(s): PROBNP in the last 8760 hours. HbA1C: No results for input(s): HGBA1C in the last 72 hours. CBG: No results for input(s): GLUCAP in the last 168 hours. Lipid Profile: No results for input(s): CHOL, HDL, LDLCALC, TRIG, CHOLHDL, LDLDIRECT in the last 72 hours. Thyroid Function Tests: No results for input(s): TSH, T4TOTAL, FREET4, T3FREE, THYROIDAB in the last 72 hours. Anemia Panel: No results for input(s): VITAMINB12, FOLATE, FERRITIN, TIBC, IRON, RETICCTPCT in the last 72 hours. Sepsis Labs: No results for input(s): PROCALCITON, LATICACIDVEN in the last 168 hours.  Recent Results (from the past 240 hour(s))  SARS CORONAVIRUS 2 (TAT 6-24 HRS) Nasopharyngeal Nasopharyngeal Swab     Status: None   Collection Time: 05/23/19  2:51 PM   Specimen: Nasopharyngeal Swab  Result Value Ref Range Status   SARS Coronavirus 2 NEGATIVE NEGATIVE Final    Comment: (NOTE) SARS-CoV-2 target nucleic acids are NOT DETECTED. The SARS-CoV-2 RNA is generally detectable in upper and lower respiratory  specimens during the acute phase of infection. Negative results do not preclude SARS-CoV-2 infection, do not rule out co-infections with other pathogens, and should not be used as the sole basis for treatment or other patient management decisions. Negative results must be combined with clinical observations, patient history, and epidemiological information. The expected result is Negative. Fact Sheet for Patients: SugarRoll.be Fact Sheet for Healthcare Providers: https://www.woods-mathews.com/ This test is not yet approved or cleared by the [place] FDA and  has been authorized for detection and/or diagnosis of SARS-CoV-2 by FDA under an Emergency Use Authorization (EUA). This EUA will remain  in effect (meaning this test can be used) for the duration of the COVID-19 declaration under Section 56 4(b)(1) of the Act, 21 U.S.C. section 360bbb-3(b)(1), unless the authorization is terminated or revoked sooner. Performed at [facility], [place] [address] [place], [place] [address]          Radiology Studies: Dg Chest [place]  1 View  Result Date: 05/23/2019 CLINICAL DATA:  GI bleed EXAM: PORTABLE CHEST 1 VIEW COMPARISON:  11/17/2017 FINDINGS: The patient's neck and chin are obscuring the apices. No focal opacity or pleural effusion. Normal heart size. Aortic atherosclerosis. No pneumothorax. Clips in the left axillary region. IMPRESSION: No active disease. Electronically Signed   By: Donavan Foil M.D.   On: 05/23/2019 14:10        Scheduled Meds: . sodium chloride   Intravenous Once  . allopurinol  200 mg Oral Daily  . atorvastatin  40 mg Oral Daily  . calcitRIOL  0.25 mcg Oral Daily  . cycloSPORINE modified  50 mg Oral BID  . metoprolol tartrate  50 mg Oral BID  . pantoprazole (PROTONIX) IV  40 mg Intravenous Q12H  . predniSONE  5 mg Oral Daily  . sodium bicarbonate  1,300 mg Oral BID   Continuous Infusions:   LOS: 2 days     Time spent: 35 mins.More than 50% of that time was spent in counseling and/or coordination of care.      Shelly Coss, MD [facility] Pager [phone]  If 7PM-7AM, please contact night-coverage [email or web address] Password [email or web address] 05/25/2019, 10:58 AM

## 2019-05-25 NOTE — Progress Notes (Addendum)
CRITICAL VALUE ALERT  Critical Value:  Hemoglobin 6.5.  Date & Time Notied:  12/6 @0620   Provider Notified: Silas [place] via [place] 12/6 @ 7530  Orders Received/Actions taken: awaiting new orders

## 2019-05-26 ENCOUNTER — Encounter (HOSPITAL_COMMUNITY): Payer: Self-pay | Admitting: Gastroenterology

## 2019-05-26 ENCOUNTER — Encounter (HOSPITAL_COMMUNITY)
Admission: EM | Disposition: A | Discharge status: Home / Self Care | Payer: Self-pay | Source: Home / Self Care | Attending: Internal Medicine

## 2019-05-26 DIAGNOSIS — N186 End stage renal disease: Secondary | ICD-10-CM | POA: Diagnosis not present

## 2019-05-26 DIAGNOSIS — K633 Ulcer of intestine: Secondary | ICD-10-CM

## 2019-05-26 DIAGNOSIS — K922 Gastrointestinal hemorrhage, unspecified: Secondary | ICD-10-CM

## 2019-05-26 DIAGNOSIS — K625 Hemorrhage of anus and rectum: Secondary | ICD-10-CM

## 2019-05-26 DIAGNOSIS — K91840 Postprocedural hemorrhage and hematoma of a digestive system organ or structure following a digestive system procedure: Secondary | ICD-10-CM

## 2019-05-26 HISTORY — PX: FLEXIBLE SIGMOIDOSCOPY: SHX5431

## 2019-05-26 HISTORY — PX: HEMOSTASIS CLIP PLACEMENT: SHX6857

## 2019-05-26 LAB — HEMOGLOBIN AND HEMATOCRIT, BLOOD
HCT: 27.3 % — ABNORMAL LOW (ref 39.0–52.0)
Hemoglobin: 8.8 g/dL — ABNORMAL LOW (ref 13.0–17.0)

## 2019-05-26 LAB — BPAM RBC
Blood Product Expiration Date: 202101052359
Blood Product Expiration Date: 202101062359
ISSUE DATE / TIME: 202012061222
ISSUE DATE / TIME: 202012061553
Unit Type and Rh: 9500
Unit Type and Rh: 9500

## 2019-05-26 LAB — BASIC METABOLIC PANEL
Anion gap: 9 (ref 5–15)
BUN: 50 mg/dL — ABNORMAL HIGH (ref 8–23)
CO2: 23 mmol/L (ref 22–32)
Calcium: 8.3 mg/dL — ABNORMAL LOW (ref 8.9–10.3)
Chloride: 106 mmol/L (ref 98–111)
Creatinine, Ser: 3.11 mg/dL — ABNORMAL HIGH (ref 0.61–1.24)
GFR calc Af Amer: 21 mL/min — ABNORMAL LOW (ref >60)
GFR calc non Af Amer: 18 mL/min — ABNORMAL LOW (ref >60)
Glucose, Bld: 107 mg/dL — ABNORMAL HIGH (ref 70–99)
Potassium: 4.7 mmol/L (ref 3.5–5.1)
Sodium: 138 mmol/L (ref 135–145)

## 2019-05-26 LAB — CBC WITH DIFFERENTIAL/PLATELET
Abs Immature Granulocytes: 0.04 10*3/uL (ref 0.00–0.07)
Basophils Absolute: 0 10*3/uL (ref 0.0–0.1)
Basophils Relative: 0 %
Eosinophils Absolute: 0.1 10*3/uL (ref 0.0–0.5)
Eosinophils Relative: 2 %
HCT: 26.7 % — ABNORMAL LOW (ref 39.0–52.0)
Hemoglobin: 8.5 g/dL — ABNORMAL LOW (ref 13.0–17.0)
Immature Granulocytes: 1 %
Lymphocytes Relative: 20 %
Lymphs Abs: 1.6 10*3/uL (ref 0.7–4.0)
MCH: 29.4 pg (ref 26.0–34.0)
MCHC: 31.8 g/dL (ref 30.0–36.0)
MCV: 92.4 fL (ref 80.0–100.0)
Monocytes Absolute: 0.5 10*3/uL (ref 0.1–1.0)
Monocytes Relative: 7 %
Neutro Abs: 5.7 10*3/uL (ref 1.7–7.7)
Neutrophils Relative %: 70 %
Platelets: 153 10*3/uL (ref 150–400)
RBC: 2.89 MIL/uL — ABNORMAL LOW (ref 4.22–5.81)
RDW: 16.1 % — ABNORMAL HIGH (ref 11.5–15.5)
WBC: 8 10*3/uL (ref 4.0–10.5)
nRBC: 0 % (ref 0.0–0.2)

## 2019-05-26 LAB — TYPE AND SCREEN
ABO/RH(D): O NEG
Antibody Screen: NEGATIVE
Unit division: 0
Unit division: 0

## 2019-05-26 SURGERY — SIGMOIDOSCOPY, FLEXIBLE
Anesthesia: Moderate Sedation

## 2019-05-26 MED ORDER — MIDAZOLAM HCL (PF) 10 MG/2ML IJ SOLN
INTRAMUSCULAR | Status: DC | PRN
  Administered 2019-05-26 (×3): 1 mg via INTRAVENOUS

## 2019-05-26 MED ORDER — FENTANYL CITRATE (PF) 100 MCG/2ML IJ SOLN
INTRAMUSCULAR | Status: AC
  Filled 2019-05-26: qty 4

## 2019-05-26 MED ORDER — DIPHENHYDRAMINE HCL 50 MG/ML IJ SOLN
INTRAMUSCULAR | Status: AC
  Filled 2019-05-26: qty 1

## 2019-05-26 MED ORDER — SODIUM CHLORIDE 0.9 % IV SOLN
INTRAVENOUS | Status: DC
  Administered 2019-05-26: 500 mL via INTRAVENOUS

## 2019-05-26 MED ORDER — FENTANYL CITRATE (PF) 100 MCG/2ML IJ SOLN
INTRAMUSCULAR | Status: DC | PRN
  Administered 2019-05-26 (×2): 25 ug via INTRAVENOUS

## 2019-05-26 MED ORDER — MIDAZOLAM HCL (PF) 5 MG/ML IJ SOLN
INTRAMUSCULAR | Status: AC
  Filled 2019-05-26: qty 2

## 2019-05-26 NOTE — Interval H&P Note (Signed)
History and Physical Interval Note:  05/26/2019 2:32 PM  Caleb Hicks  has presented today for surgery, with the diagnosis of hematochezia.  The various methods of treatment have been discussed with the patient and family. After consideration of risks, benefits and other options for treatment, the patient has consented to  Procedure(s): FLEXIBLE SIGMOIDOSCOPY (N/A) as a surgical intervention.  The patient's history has been reviewed, patient examined, no change in status, stable for surgery.  I have reviewed the patient's chart and labs.  Questions were answered to the patient's satisfaction.     [employer]

## 2019-05-26 NOTE — Care Management Important Message (Signed)
Important Message  Patient Details IM Letter given to Dessa Phi RN to present to the Patient Name: Caleb Hicks MRN: [id number] Date of Birth: 11/19/1940   Medicare Important Message Given:  Yes     Kerin Salen 05/26/2019, 12:34 PM

## 2019-05-26 NOTE — H&P (View-Only) (Signed)
     Progress Note    ASSESSMENT AND PLAN:   1. GI bleed, likely diverticular hemorrhage on ASA.  He had a medium size bloody stool with blood on tissue this am per nursing staff.  -Given the ongoing bleeding will arrange for flexible sigmoidoscopy to make sure bleeding now not from polypectomy site. The risks and benefits of flexible sigmoidoscopy were discussed and the patient agrees to proceed.  -tap water enema, NPO now.  2. Acute on chronic anemia requiring 2uPRBC yesterday after hgb declined from 10.3 to 6.5. Hgb improved post-transfusion to 8.8 and remained stable overnight but watch closely given #1.   3. Colon polyp, path pending  4. Hx of failed renal transplant   SUBJECTIVE    Feels okay. Feels like the bloody stool this am was fresh   OBJECTIVE:     Vital signs in last 24 hours: Temp:  [97.7 F (36.5 C)-99.1 F (37.3 C)] 97.7 F (36.5 C) (12/07 0408) Pulse Rate:  [57-73] 63 (12/07 0408) Resp:  [17-20] 18 (12/07 0408) BP: (125-174)/(53-60) 174/53 (12/07 0408) SpO2:  [99 %-100 %] 100 % (12/07 0408) Last BM Date: 05/24/19 General:   Alert, well-developed male in NAD EENT:  Normal hearing, non icteric sclera   Heart:  Regular rate and rhythm;  No lower extremity edema   Pulm: Normal respiratory effort   Abdomen:  Soft, nondistended, nontender.  Normal bowel sounds.          Neurologic:  Alert and  oriented x4;  grossly normal neurologically. Psych:  Pleasant, cooperative.  Normal mood and affect.   Intake/Output from previous day: 12/06 0701 - 12/07 0700 In: 959 [P.O.:340; I.V.:15; Blood:604] Out: 350 [Urine:350] Intake/Output this shift: No intake/output data recorded.  Lab Results: Recent Labs    05/24/19 0457 05/25/19 0514 05/25/19 2233 05/26/19 0439  WBC 11.4* 8.5  --  8.0  HGB 8.9* 6.5* 8.8* 8.5*  HCT 29.6* 21.2* 27.3* 26.7*  PLT 210 175  --  153   BMET Recent Labs    05/23/19 1316 05/24/19 0457 05/26/19 0439  NA 141 142 138  K 4.5  4.9 4.7  CL 104 104 106  CO2 26 23 23   GLUCOSE 117* 182* 107*  BUN 54* 55* 50*  CREATININE 3.12* 3.24* 3.11*  CALCIUM 9.6 9.0 8.3*   LFT Recent Labs    05/23/19 1316  PROT 5.4*  ALBUMIN 2.8*  AST 10*  ALT 8  ALKPHOS 55  BILITOT 0.7      Active Problems:   Rectal bleeding   Lower GI bleed   ESRD (end stage renal disease) ([place])   Benign neoplasm of sigmoid colon   Diverticulosis of colon with hemorrhage     LOS: 3 days   Tye Savoy ,NP 05/26/2019, 8:44 AM     Attending physician's note   I have taken an interval history, reviewed the chart and examined the patient. I agree with the Advanced Practitioner's note, impression and recommendations.   Continues to have rectal bleeding, hemoglobin downtrending s/p transfusion. Possible etiology bleeding from post polypectomy site in sigmoid colon versus diverticular hemorrhage. Plan for sigmoidoscopy to evaluate and for possible therapeutic intervention if needed.  Will be scheduled with Dr. Rush Landmark. The risks and benefits as well as alternatives of endoscopic procedure(s) have been discussed and reviewed. All questions answered. The patient agrees to proceed.  Damaris Hippo , MD [phone]

## 2019-05-26 NOTE — Consult Note (Addendum)
     Progress Note    ASSESSMENT AND PLAN:   1. GI bleed, likely diverticular hemorrhage on ASA.  He had a medium size bloody stool with blood on tissue this am per nursing staff.  -Given the ongoing bleeding will arrange for flexible sigmoidoscopy to make sure bleeding now not from polypectomy site. The risks and benefits of flexible sigmoidoscopy were discussed and the patient agrees to proceed.  -tap water enema, NPO now.  2. Acute on chronic anemia requiring 2uPRBC yesterday after hgb declined from 10.3 to 6.5. Hgb improved post-transfusion to 8.8 and remained stable overnight but watch closely given #1.   3. Colon polyp, path pending  4. Hx of failed renal transplant   SUBJECTIVE    Feels okay. Feels like the bloody stool this am was fresh   OBJECTIVE:     Vital signs in last 24 hours: Temp:  [97.7 F (36.5 C)-99.1 F (37.3 C)] 97.7 F (36.5 C) (12/07 0408) Pulse Rate:  [57-73] 63 (12/07 0408) Resp:  [17-20] 18 (12/07 0408) BP: (125-174)/(53-60) 174/53 (12/07 0408) SpO2:  [99 %-100 %] 100 % (12/07 0408) Last BM Date: 05/24/19 General:   Alert, well-developed male in NAD EENT:  Normal hearing, non icteric sclera   Heart:  Regular rate and rhythm;  No lower extremity edema   Pulm: Normal respiratory effort   Abdomen:  Soft, nondistended, nontender.  Normal bowel sounds.          Neurologic:  Alert and  oriented x4;  grossly normal neurologically. Psych:  Pleasant, cooperative.  Normal mood and affect.   Intake/Output from previous day: 12/06 0701 - 12/07 0700 In: 959 [P.O.:340; I.V.:15; Blood:604] Out: 350 [Urine:350] Intake/Output this shift: No intake/output data recorded.  Lab Results: Recent Labs    05/24/19 0457 05/25/19 0514 05/25/19 2233 05/26/19 0439  WBC 11.4* 8.5  --  8.0  HGB 8.9* 6.5* 8.8* 8.5*  HCT 29.6* 21.2* 27.3* 26.7*  PLT 210 175  --  153   BMET Recent Labs    05/23/19 1316 05/24/19 0457 05/26/19 0439  NA 141 142 138  K 4.5  4.9 4.7  CL 104 104 106  CO2 26 23 23   GLUCOSE 117* 182* 107*  BUN 54* 55* 50*  CREATININE 3.12* 3.24* 3.11*  CALCIUM 9.6 9.0 8.3*   LFT Recent Labs    05/23/19 1316  PROT 5.4*  ALBUMIN 2.8*  AST 10*  ALT 8  ALKPHOS 55  BILITOT 0.7      Active Problems:   Rectal bleeding   Lower GI bleed   ESRD (end stage renal disease) ([place])   Benign neoplasm of sigmoid colon   Diverticulosis of colon with hemorrhage     LOS: 3 days   Tye Savoy ,NP 05/26/2019, 8:44 AM     Attending physician's note   I have taken an interval history, reviewed the chart and examined the patient. I agree with the Advanced Practitioner's note, impression and recommendations.   Continues to have rectal bleeding, hemoglobin downtrending s/p transfusion. Possible etiology bleeding from post polypectomy site in sigmoid colon versus diverticular hemorrhage. Plan for sigmoidoscopy to evaluate and for possible therapeutic intervention if needed.  Will be scheduled with Dr. Rush Landmark. The risks and benefits as well as alternatives of endoscopic procedure(s) have been discussed and reviewed. All questions answered. The patient agrees to proceed.  Damaris Hippo , MD [phone]

## 2019-05-26 NOTE — Op Note (Signed)
[facility] Patient Name: Caleb Hicks Procedure Date: 05/26/2019 MRN: [id number] Attending MD: Justice Britain , MD Date of Birth: 06-Apr-1941 CSN: [id number] Age: 78 Admit Type: Inpatient Procedure:                Flexible Sigmoidoscopy Indications:              Hematochezia Providers:                Justice Britain, MD, Marguerita Merles, Technician Referring MD:             Dr. Havery Moros, [facility] Medicines:                Fentanyl 50 micrograms IV, Midazolam 3 mg IV Complications:            No immediate complications. Estimated Blood Loss:     Estimated blood loss: none. Procedure:                Pre-Anesthesia Assessment:                           - Prior to the procedure, a History and Physical                            was performed, and patient medications and                            allergies were reviewed. The patient's tolerance of                            previous anesthesia was also reviewed. The risks                            and benefits of the procedure and the sedation                            options and risks were discussed with the patient.                            All questions were answered, and informed consent                            was obtained. Prior Anticoagulants: The patient has                            taken no previous anticoagulant or antiplatelet                            agents except for aspirin. ASA Grade Assessment:                            III - A patient with severe systemic disease. After                            reviewing the risks and benefits, the patient was  deemed in satisfactory condition to undergo the                            procedure.                           After obtaining informed consent, the scope was                            passed under direct vision. The GIF-H190 ([id number])                            Olympus gastroscope was introduced through  the anus                            and advanced to the the left transverse colon. The                            flexible sigmoidoscopy was accomplished without                            difficulty. The patient tolerated the procedure.                            The quality of the bowel preparation was poor. Scope In: 3:12:01 PM Scope Out: 3:52:30 PM Total Procedure Duration: 0 hours 40 minutes 29 seconds  Findings:      The digital rectal exam findings include hemorrhoids. Pertinent       negatives include no palpable rectal lesions.      Clotted blood was found in the rectum, in the recto-sigmoid colon and in       the sigmoid colon.      A large amount of solid brown stool was found in the descending colon,       in the transverse colon, at the hepatic flexure and in the ascending       colon.      Extensive amounts of semi-solid and solid stool that was admixed with       red blood and was marroon/dark brown in color was found in the rectum,       in the recto-sigmoid colon and in the sigmoid colon, interfering with       visualization. Lavage of the area was performed using copious amounts,       resulting in incomplete clearance with continued poor visualization.      A single (solitary) eight mm ulcer with adjacent clip was found in the       sigmoid colon at approximately 28 cm from anal verge. No active bleeding       was present but this was within the region that had the admixed       blood/stool. To prevent bleeding post-intervention if this was the       recent source, two more hemostatic clips were successfully placed (MR       conditional). There was no bleeding at the end of the procedure.      Many small and large-mouthed diverticula were found in the recto-sigmoid       colon, sigmoid colon and descending colon without active  bleeding noted.      Non-bleeding non-thrombosed external and internal hemorrhoids were found       during retroflexion, during perianal exam  and during digital exam. The       hemorrhoids were Grade I (internal hemorrhoids that do not prolapse). Impression:               - Preparation of the colon was poor.                           - Hemorrhoids found on digital rectal exam.                           - Blood in the rectum, in the recto-sigmoid colon                            and in the sigmoid colon.                           [place] Shark Stool in the descending colon, in the                            transverse colon, at the hepatic flexure and in the                            ascending colon.                           - Brown/Marroon Admixed Stool in the rectum, in the                            recto-sigmoid colon and in the sigmoid colon.                           - A single (solitary) ulcer with adjacent hemoclip                            in the sigmoid colon of previous polypectomy. Clips                            (MR conditional) were placed to further decrease                            risk of rebleeding in presumption of this being                            source.                           - Diverticulosis in the recto-sigmoid colon, in the                            sigmoid colon and in the descending colon.                           -  Non-bleeding non-thrombosed external and internal                            hemorrhoids.                           - Query bleed from recent polypectomy site vs                            another diverticular hemorrhage. Moderate Sedation:      Moderate (conscious) sedation was administered by the endoscopy nurse       and supervised by the endoscopist. The following parameters were       monitored: oxygen saturation, heart rate, blood pressure, and response       to care. Total physician intraservice time was 46 minutes. Recommendation:           - The patient will be observed post-procedure,                            until all discharge criteria are met.                            - Return patient to hospital ward for ongoing care.                           - Trend Hgb/Hct.                           - Expect that his next 1-2 bowel movements will                            have some blood but should be brown in the next                            24-48 hours thereafter. If patient has recurrent                            transfusion dependent anemia and hematochezia recur                            would consider repeat Flex sigmoidoscopy with                            preparation +/- Tagged RBC vs CT-Angiography based                            on clinical stability.                           - The findings and recommendations were discussed                            with the patient.                           -  The findings and recommendations were discussed                            with the referring physician. Procedure Code(s):        --- Professional ---                           [phone], Sigmoidoscopy, flexible; diagnostic,                            including collection of specimen(s) by brushing or                            washing, when performed (separate procedure)                           99153, Moderate sedation; each additional 15                            minutes intraservice time                           99153, Moderate sedation; each additional 15                            minutes intraservice time                           G0500, Moderate sedation services provided by the                            same physician or other qualified health care                            professional performing a gastrointestinal                            endoscopic service that sedation supports,                            requiring the presence of an independent trained                            observer to assist in the monitoring of the                            patient's level of consciousness and physiological                            status;  initial 15 minutes of intra-service time;                            patient age 51 years or older (additional time may                            be  reported with [phone], as appropriate) Diagnosis Code(s):        --- Professional ---                           K64.1, Second degree hemorrhoids                           K62.5, Hemorrhage of anus and rectum                           K92.2, Gastrointestinal hemorrhage, unspecified                           K63.3, Ulcer of intestine                           K92.1, Melena (includes Hematochezia)                           K57.30, Diverticulosis of large intestine without                            perforation or abscess without bleeding CPT copyright 2019 American Medical Association. All rights reserved. The codes documented in this report are preliminary and upon coder review may  be revised to meet current compliance requirements. Justice Britain, MD 05/26/2019 4:09:40 PM Number of Addenda: 0

## 2019-05-26 NOTE — Progress Notes (Signed)
Patient had one medium size bloody stool this morning. Bright red blood noted on tissue paper.

## 2019-05-26 NOTE — Progress Notes (Signed)
PROGRESS NOTE    Caleb Hicks  TWS:[id number] DOB: 1941/05/07 DOA: 05/23/2019 PCP: [name], MD   Brief Narrative:  Patient is a 78 year old male with history of failed kidney transplant, ESRD, gout, hypertension, hyperlipidemia, anemia, abdominal aortic aneurysm who presented from home with complaints of rectal bleeding.  He reported 6 loose bloody bowel movements at home.  Never had EGD in the past.  He was on aspirin 325 mg at home.  Patient was admitted for the management of hematochezia.  GI consulted.  Underwent colonoscopy on 05/24/19  without finding of any active bleeding,and underwent polypectomy of an inflamed polyp in the sigmoid colon.  On the night after colonoscopy,he had  2 bowel movements which were bloody.His hemoglobin dropped to the range of 6.5 the next day so was  transfused with 2 units of PRBC.He again had an episode of bloody bowel movement on the night of 05/25/19.  He underwent flexible sigmoidoscopy on 05/26/19  by GI.  Suspected to have  bleeding secondary to polypectomy or from another diverticular site.  Plan to check his hemoglobin tomorrow, if it remains stable, patient will will be discharged home.  Assessment & Plan:   Active Problems:   Rectal bleeding   Lower GI bleed   ESRD (end stage renal disease) ([facility])   Benign neoplasm of sigmoid colon   Diverticulosis of colon with hemorrhage   Hematochezia/lower GI bleed:   GI consulted.  Underwent colonoscopy on 05/24/19  without finding of any active bleeding,and underwent polypectomy of an inflamed polyp in the sigmoid colon.  On the night after colonoscopy,he had  2 bowel movements which were bloody.His hemoglobin dropped to the range of 6.5 the next day so was  transfused with 2 units of PRBC.He again had an episode of bloody bowel movement on the night of 05/25/19.  He underwent flexible sigmoidoscopy on 05/26/19  by GI.  Suspected to have  bleeding secondary to polypectomy or from another diverticular site.   Plan to check his hemoglobin tomorrow.  ESRD/failed kidney transplant: Follows with nephrology, Dr. [name].  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  Baseline creatinine around 3-4.  Currently kidney function at baseline.  Continue prednisone, cyclosporine.  Continue sodium bicarb.  Hypertension: Currently blood pressure stable.  Will resume his home medicines on discharge.  Hyperlipidemia: Continue Lipitor  Normocytic anemia : Secondary to GI bleed, ESRD.  S/P  2 units of PRBC.  Gout: Continue allopurinol  Mild thrombocytopenia: Currently stable.  Continue to monitor         DVT prophylaxis: SCD Code Status: DNR Family Communication: Called daughter on phone on 05/24/19 Disposition Plan: Home likely tomorrow if Hb remains stable  Consultants: GI  Procedures: Colonoscopy  Antimicrobials:  Anti-infectives (From admission, onward)   None      Subjective:  Patient seen and examined at the bedside this morning.  Remains hemodynamically stable.  Had another moderate amount of bloody bowel movement last night.  He was waiting for  flexible sigmoidoscopy today.No abdominal pain.  Objective: Vitals:   05/26/19 1540 05/26/19 1545 05/26/19 1550 05/26/19 1601  BP: (!) 144/56 (!) 145/46 (!) 149/46 (!) 145/51  Pulse: 64 66 66 61  Resp: (!) 25 (!) 26 (!) 26 (!) 23  Temp:    97.6 F (36.4 C)  TempSrc:    Temporal  SpO2: 100% 97% 100% 99%  Weight:      Height:        Intake/Output Summary (Last 24 hours)  at 05/26/2019 1726 Last data filed at 05/26/2019 1410 Gross per 24 hour  Intake 864 ml  Output 300 ml  Net 564 ml   Filed Weights   05/23/19 1044  Weight: 68.5 kg    Examination:  General exam: Appears calm and comfortable ,Not in distress,average built HEENT:PERRL,Oral mucosa moist, Ear/Nose normal on gross exam Respiratory system: Bilateral equal air entry, normal vesicular breath sounds, no wheezes or crackles  Cardiovascular system: S1 & S2 heard, RRR. No JVD,  murmurs, rubs, gallops or clicks. No pedal edema. Gastrointestinal system: Abdomen is nondistended, soft and nontender. No organomegaly or masses felt. Normal bowel sounds heard. Central nervous system: Alert and oriented. No focal neurological deficits. Extremities: No edema, no clubbing ,no cyanosis, distal peripheral pulses palpable.AV graft on the left arm Skin: No rashes, lesions or ulcers,no icterus ,no pallor     Data Reviewed: I have personally reviewed following labs and imaging studies  CBC: Recent Labs  Lab 05/23/19 1048 05/23/19 1537 05/24/19 0457 05/25/19 0514 05/25/19 2233 05/26/19 0439  WBC 10.3  --  11.4* 8.5  --  8.0  NEUTROABS  --   --   --  5.7  --  5.7  HGB 10.7* 10.3* 8.9* 6.5* 8.8* 8.5*  HCT 36.1* 33.9* 29.6* 21.2* 27.3* 26.7*  MCV 104.0*  --  100.3* 100.0  --  92.4  PLT 136*  --  210 175  --  831   Basic Metabolic Panel: Recent Labs  Lab 05/23/19 1316 05/24/19 0457 05/26/19 0439  NA 141 142 138  K 4.5 4.9 4.7  CL 104 104 106  CO2 26 23 23   GLUCOSE 117* 182* 107*  BUN 54* 55* 50*  CREATININE 3.12* 3.24* 3.11*  CALCIUM 9.6 9.0 8.3*   GFR: Estimated Creatinine Clearance: 18 mL/min (A) (by C-G formula based on SCr of 3.11 mg/dL (H)). Liver Function Tests: Recent Labs  Lab 05/23/19 1316  AST 10*  ALT 8  ALKPHOS 55  BILITOT 0.7  PROT 5.4*  ALBUMIN 2.8*   No results for input(s): LIPASE, AMYLASE in the last 168 hours. No results for input(s): AMMONIA in the last 168 hours. Coagulation Profile: No results for input(s): INR, PROTIME in the last 168 hours. Cardiac Enzymes: No results for input(s): CKTOTAL, CKMB, CKMBINDEX, TROPONINI in the last 168 hours. BNP (last 3 results) No results for input(s): PROBNP in the last 8760 hours. HbA1C: No results for input(s): HGBA1C in the last 72 hours. CBG: No results for input(s): GLUCAP in the last 168 hours. Lipid Profile: No results for input(s): CHOL, HDL, LDLCALC, TRIG, CHOLHDL, LDLDIRECT in  the last 72 hours. Thyroid Function Tests: No results for input(s): TSH, T4TOTAL, FREET4, T3FREE, THYROIDAB in the last 72 hours. Anemia Panel: No results for input(s): VITAMINB12, FOLATE, FERRITIN, TIBC, IRON, RETICCTPCT in the last 72 hours. Sepsis Labs: No results for input(s): PROCALCITON, LATICACIDVEN in the last 168 hours.  Recent Results (from the past 240 hour(s))  SARS CORONAVIRUS 2 (TAT 6-24 HRS) Nasopharyngeal Nasopharyngeal Swab     Status: None   Collection Time: 05/23/19  2:51 PM   Specimen: Nasopharyngeal Swab  Result Value Ref Range Status   SARS Coronavirus 2 NEGATIVE NEGATIVE Final    Comment: (NOTE) SARS-CoV-2 target nucleic acids are NOT DETECTED. The SARS-CoV-2 RNA is generally detectable in upper and lower respiratory specimens during the acute phase of infection. Negative results do not preclude SARS-CoV-2 infection, do not rule out co-infections with other pathogens, and should not be used  as the sole basis for treatment or other patient management decisions. Negative results must be combined with clinical observations, patient history, and epidemiological information. The expected result is Negative. Fact Sheet for Patients: SugarRoll.be Fact Sheet for Healthcare Providers: https://www.woods-mathews.com/ This test is not yet approved or cleared by the [place] FDA and  has been authorized for detection and/or diagnosis of SARS-CoV-2 by FDA under an Emergency Use Authorization (EUA). This EUA will remain  in effect (meaning this test can be used) for the duration of the COVID-19 declaration under Section 56 4(b)(1) of the Act, 21 U.S.C. section 360bbb-3(b)(1), unless the authorization is terminated or revoked sooner. Performed at [facility], [place] [address] [place], [place] [address]          Radiology Studies: No results found.      Scheduled Meds: . allopurinol  200 mg Oral Daily  .  atorvastatin  40 mg Oral Daily  . calcitRIOL  0.25 mcg Oral Daily  . cycloSPORINE modified  50 mg Oral BID  . metoprolol tartrate  50 mg Oral BID  . pantoprazole (PROTONIX) IV  40 mg Intravenous Q12H  . predniSONE  5 mg Oral Daily  . sodium bicarbonate  1,300 mg Oral BID   Continuous Infusions:   LOS: 3 days    Time spent: 35 mins.More than 50% of that time was spent in counseling and/or coordination of care.      Shelly Coss, MD [facility] Pager [phone]  If 7PM-7AM, please contact night-coverage [email or web address] Password [facility] 05/26/2019, 5:26 PM

## 2019-05-27 ENCOUNTER — Telehealth: Payer: Self-pay

## 2019-05-27 DIAGNOSIS — K625 Hemorrhage of anus and rectum: Secondary | ICD-10-CM

## 2019-05-27 LAB — CBC WITH DIFFERENTIAL/PLATELET
Abs Immature Granulocytes: 0.05 10*3/uL (ref 0.00–0.07)
Basophils Absolute: 0 10*3/uL (ref 0.0–0.1)
Basophils Relative: 0 %
Eosinophils Absolute: 0.2 10*3/uL (ref 0.0–0.5)
Eosinophils Relative: 2 %
HCT: 27.6 % — ABNORMAL LOW (ref 39.0–52.0)
Hemoglobin: 8.7 g/dL — ABNORMAL LOW (ref 13.0–17.0)
Immature Granulocytes: 1 %
Lymphocytes Relative: 24 %
Lymphs Abs: 1.9 10*3/uL (ref 0.7–4.0)
MCH: 30.4 pg (ref 26.0–34.0)
MCHC: 31.5 g/dL (ref 30.0–36.0)
MCV: 96.5 fL (ref 80.0–100.0)
Monocytes Absolute: 0.5 10*3/uL (ref 0.1–1.0)
Monocytes Relative: 7 %
Neutro Abs: 5.3 10*3/uL (ref 1.7–7.7)
Neutrophils Relative %: 66 %
Platelets: 179 10*3/uL (ref 150–400)
RBC: 2.86 MIL/uL — ABNORMAL LOW (ref 4.22–5.81)
RDW: 15.6 % — ABNORMAL HIGH (ref 11.5–15.5)
WBC: 7.9 10*3/uL (ref 4.0–10.5)
nRBC: 0 % (ref 0.0–0.2)

## 2019-05-27 LAB — SURGICAL PATHOLOGY

## 2019-05-27 MED ORDER — PANTOPRAZOLE SODIUM 40 MG PO TBEC: 40.0000 mg | DELAYED_RELEASE_TABLET | Freq: Every day | ORAL | 0 refills | Status: AC

## 2019-05-27 NOTE — Telephone Encounter (Signed)
CBC has been added to Epic.  The pt will be advised at discharge.

## 2019-05-27 NOTE — Progress Notes (Signed)
     Progress Note    ASSESSMENT AND PLAN:   1. GI bleed, likely diverticular hemorrhage on ASA . Rebled prompting flexible sigmoidoscopy yesterday with findings of post-polypectomoy ulcer. Clip placed on ulcer ( presumed site of recurrent bleed). No bleeding since.   -Stable for discharge from GI standpoint.  -Follow up with me 06/06/19 at 2pm  2. Acute on chronic anemia requiring 2uPRBC. Hgb improved post-transfusion to 8.8 and has remained stable  3. Colon polyp, path pending  4. Hx of failed renal transplant    SUBJECTIVE   No complaints. No further bleeding    OBJECTIVE:     Vital signs in last 24 hours: Temp:  [97.5 F (36.4 C)-98.7 F (37.1 C)] 97.5 F (36.4 C) (12/08 0454) Pulse Rate:  [56-67] 61 (12/08 0454) Resp:  [13-26] 16 (12/08 0454) BP: (119-171)/(42-63) 152/54 (12/08 0454) SpO2:  [91 %-100 %] 98 % (12/08 0454) Last BM Date: 05/26/19 General:   Alert, well-developed male in NAD   Heart:  Regular rate and rhythm Pulm: Normal respiratory effort   Abdomen:  Soft, nondistended, nontender.  Normal bowel sounds.          Neurologic:  Alert and  oriented x4;  grossly normal neurologically. Psych:  Pleasant, cooperative.  Normal mood and affect.   Intake/Output from previous day: 12/07 0701 - 12/08 0700 In: 720 [P.O.:720] Out: 510 [Urine:510] Intake/Output this shift: No intake/output data recorded.  Lab Results: Recent Labs    05/25/19 0514 05/25/19 2233 05/26/19 0439  WBC 8.5  --  8.0  HGB 6.5* 8.8* 8.5*  HCT 21.2* 27.3* 26.7*  PLT 175  --  153   BMET Recent Labs    05/26/19 0439  NA 138  K 4.7  CL 106  CO2 23  GLUCOSE 107*  BUN 50*  CREATININE 3.11*  CALCIUM 8.3*      Active Problems:   Rectal bleeding   Lower GI bleed   ESRD (end stage renal disease) (HCC)   Benign neoplasm of sigmoid colon   Diverticulosis of colon with hemorrhage     LOS: 4 days   Tye Savoy ,NP 05/27/2019, 8:56 AM

## 2019-05-27 NOTE — Telephone Encounter (Signed)
-----   Message from Irving Copas., MD sent at 05/27/2019 11:30 AM EST ----- Regarding: Follow-up Caleb Hicks,Please schedule a CBC in 1 week (post discharge most likely will be today or tomorrow) and have that addressed to Dr. Havery Moros.  Follow-up will be dictated by Dr. Doyne Keel pathology and results of CBC.Thanks.[place]

## 2019-05-27 NOTE — Discharge Summary (Signed)
Physician Discharge Summary  Caleb Hicks SLH:[id number] DOB: 1940/06/27 DOA: 05/23/2019  PCP: [name], MD  Admit date: 05/23/2019 Discharge date: 05/27/2019  Admitted From: Home Disposition:  Home  Discharge Condition:Stable CODE STATUS: DNR Diet recommendation: Heart Healthy   Brief/Interim Summary:  Patient is a 78 year old male with history of failed kidney transplant, ESRD, gout, hypertension, hyperlipidemia, anemia, abdominal aortic aneurysm who presented from home with complaints of rectal bleeding.  He reported 6 loose bloody bowel movements at home.  Never had EGD in the past.  He was on aspirin 325 mg at home.  Patient was admitted for the management of hematochezia.  GI consulted.  Underwent colonoscopy on 05/24/19  without finding of any active bleeding,and underwent polypectomy of an inflamed polyp in the sigmoid colon.  On the night after colonoscopy,he had  2 bowel movements which were bloody.His hemoglobin dropped to the range of 6.5 the next day so was  transfused with 2 units of PRBC.He again had an episode of bloody bowel movement on the night of 05/25/19.  He underwent flexible sigmoidoscopy on 05/26/19  by GI.  Suspected to have  bleeding secondary to polypectomy or from another diverticular site.  His Hb is stable this morning, patient will  be discharged home.  Following problems were addressed during his hospitalization:   Hematochezia/lower GI bleed:   GI consulted.  Underwent colonoscopy on 05/24/19  without finding of any active bleeding,and underwent polypectomy of an inflamed polyp in the sigmoid colon.  On the night after colonoscopy,he had  2 bowel movements which were bloody.His hemoglobin dropped to the range of 6.5 the next day so was  transfused with 2 units of PRBC.He again had an episode of bloody bowel movement on the night of 05/25/19.  He underwent flexible sigmoidoscopy on 05/26/19  by GI.  Suspected to have  bleeding secondary to polypectomy or from  another diverticular site. Hb stable this morning. He was on aspirin at home and it has been discontinued.  ESRD/failed kidney transplant: Follows with nephrology, Dr. [name].  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  Baseline creatinine around 3-4.  Currently kidney function at baseline.  Continue prednisone, cyclosporine.  Continue sodium bicarb.  Hypertension: Currently blood pressure stable.  Will resume his home medicines on discharge.  Hyperlipidemia: Continue Lipitor  Normocytic anemia : Secondary to GI bleed, ESRD.  S/P  2 units of PRBC.  Gout: Continue allopurinol  Mild thrombocytopenia: Currently stable.  Continue to monitor  Discharge Diagnoses:  Active Problems:   Rectal bleeding   Lower GI bleed   ESRD (end stage renal disease) (HCC)   Benign neoplasm of sigmoid colon   Diverticulosis of colon with hemorrhage    Discharge Instructions  Discharge Instructions    Diet - low sodium heart healthy   Complete by: As directed    Discharge instructions   Complete by: As directed    1)Please follow up with your PCP in a week.Do a CBC and BMP tests during the follow up. 2)You have an appointment with gastroenterology on Dec 18,2 pm. 3)We have stopped aspirin. Take prescribed medication as instructed.   Increase activity slowly   Complete by: As directed      Allergies as of 05/27/2019      Reactions   Norvasc [amlodipine Besylate] Other (See Comments)   Weight gain   Tape Other (See Comments)   Tears skin      Medication List    STOP taking these medications  aspirin 325 MG EC tablet     TAKE these medications   allopurinol 100 MG tablet Commonly known as: ZYLOPRIM Take 200 mg by mouth daily.   atorvastatin 40 MG tablet Commonly known as: LIPITOR Take 1 tablet (40 mg total) by mouth daily.   calcitRIOL 0.25 MCG capsule Commonly known as: ROCALTROL Take 1 capsule (0.25 mcg total) by mouth daily. What changed: when to take this   calcium  carbonate 1250 (500 Ca) MG tablet Commonly known as: OS-CAL - dosed in mg of elemental calcium Take 1-2 tablets by mouth See admin instructions. 2 tablets in the morning, and 1 tablet in the evening   furosemide 40 MG tablet Commonly known as: LASIX Take 1 tablet (40 mg total) by mouth daily.   IRON PO Take 1 tablet by mouth 2 (two) times a day.   Januvia 25 MG tablet Generic drug: sitaGLIPtin TAKE 1 TABLET(25 MG) BY MOUTH DAILY What changed: See the new instructions.   MAGnesium-Oxide 400 (241.3 Mg) MG tablet Generic drug: magnesium oxide Take 1 tablet by mouth 2 (two) times a day.   metoprolol tartrate 50 MG tablet Commonly known as: LOPRESSOR Take 50 mg by mouth 2 (two) times daily.   Neoral 25 MG capsule Generic drug: cycloSPORINE modified Take 2 tablets by mouth 2 (two) times a day.   pantoprazole 40 MG tablet Commonly known as: Protonix Take 1 tablet (40 mg total) by mouth daily.   predniSONE 5 MG tablet Commonly known as: DELTASONE Take 5 mg by mouth daily.   senna-docusate 8.6-50 MG tablet Commonly known as: Senokot-S Take 1 tablet by mouth at bedtime. What changed:   when to take this  reasons to take this   sodium bicarbonate 650 MG tablet Take 1,300 mg by mouth 2 (two) times daily.      Follow-up Information    [name], NP Follow up on 06/06/2019.   Specialty: Gastroenterology Why: at 2pm. Please arrive 10 minutes early Contact information: [place] [address] [phone]        [name], MD. Schedule an appointment as soon as possible for a visit in 1 week(s).   Specialty: Family Medicine Contact information: [place] [address] [phone]          Allergies  Allergen Reactions  . Norvasc [Amlodipine Besylate] Other (See Comments)    Weight gain  . Tape Other (See Comments)    Tears skin    Consultations:  GI   Procedures/Studies: Dg Chest Port 1 View  Result  Date: 05/23/2019 CLINICAL DATA:  GI bleed EXAM: PORTABLE CHEST 1 VIEW COMPARISON:  11/17/2017 FINDINGS: The patient's neck and chin are obscuring the apices. No focal opacity or pleural effusion. Normal heart size. Aortic atherosclerosis. No pneumothorax. Clips in the left axillary region. IMPRESSION: No active disease. Electronically Signed   By: Donavan Foil M.D.   On: 05/23/2019 14:10       Subjective: Patient seen and examined the bedside this morning.  Hemodynamically stable for discharge today.  Discharge Exam: Vitals:   05/26/19 2200 05/27/19 0454  BP:  (!) 152/54  Pulse: 61 61  Resp:  16  Temp:  (!) 97.5 F (36.4 C)  SpO2:  98%   Vitals:   05/26/19 1601 05/26/19 2101 05/26/19 2200 05/27/19 0454  BP: (!) 145/51 (!) 153/50  (!) 152/54  Pulse: 61 (!) 57 61 61  Resp: (!) 23 20  16   Temp: 97.6 F (  36.4 C) 98.1 F (36.7 C)  (!) 97.5 F (36.4 C)  TempSrc: Temporal Oral  Oral  SpO2: 99% 100%  98%  Weight:      Height:        General: Pt is alert, awake, not in acute distress Cardiovascular: RRR, S1/S2 +, no rubs, no gallops Respiratory: CTA bilaterally, no wheezing, no rhonchi Abdominal: Soft, NT, ND, bowel sounds + Extremities: no edema, no cyanosis    The results of significant diagnostics from this hospitalization (including imaging, microbiology, ancillary and laboratory) are listed below for reference.     Microbiology: Recent Results (from the past 240 hour(s))  SARS CORONAVIRUS 2 (TAT 6-24 HRS) Nasopharyngeal Nasopharyngeal Swab     Status: None   Collection Time: 05/23/19  2:51 PM   Specimen: Nasopharyngeal Swab  Result Value Ref Range Status   SARS Coronavirus 2 NEGATIVE NEGATIVE Final    Comment: (NOTE) SARS-CoV-2 target nucleic acids are NOT DETECTED. The SARS-CoV-2 RNA is generally detectable in upper and lower respiratory specimens during the acute phase of infection. Negative results do not preclude SARS-CoV-2 infection, do not rule  out co-infections with other pathogens, and should not be used as the sole basis for treatment or other patient management decisions. Negative results must be combined with clinical observations, patient history, and epidemiological information. The expected result is Negative. Fact Sheet for Patients: SugarRoll.be Fact Sheet for Healthcare Providers: https://www.woods-mathews.com/ This test is not yet approved or cleared by the [place] FDA and  has been authorized for detection and/or diagnosis of SARS-CoV-2 by FDA under an Emergency Use Authorization (EUA). This EUA will remain  in effect (meaning this test can be used) for the duration of the COVID-19 declaration under Section 56 4(b)(1) of the Act, 21 U.S.C. section 360bbb-3(b)(1), unless the authorization is terminated or revoked sooner. Performed at [facility], [place] [address], [place], [place] [address]      Labs: BNP (last 3 results) No results for input(s): BNP in the last 8760 hours. Basic Metabolic Panel: Recent Labs  Lab 05/23/19 1316 05/24/19 0457 05/26/19 0439  NA 141 142 138  K 4.5 4.9 4.7  CL 104 104 106  CO2 26 23 23   GLUCOSE 117* 182* 107*  BUN 54* 55* 50*  CREATININE 3.12* 3.24* 3.11*  CALCIUM 9.6 9.0 8.3*   Liver Function Tests: Recent Labs  Lab 05/23/19 1316  AST 10*  ALT 8  ALKPHOS 55  BILITOT 0.7  PROT 5.4*  ALBUMIN 2.8*   No results for input(s): LIPASE, AMYLASE in the last 168 hours. No results for input(s): AMMONIA in the last 168 hours. CBC: Recent Labs  Lab 05/23/19 1048  05/24/19 0457 05/25/19 0514 05/25/19 2233 05/26/19 0439 05/27/19 0907  WBC 10.3  --  11.4* 8.5  --  8.0 7.9  NEUTROABS  --   --   --  5.7  --  5.7 5.3  HGB 10.7*   < > 8.9* 6.5* 8.8* 8.5* 8.7*  HCT 36.1*   < > 29.6* 21.2* 27.3* 26.7* 27.6*  MCV 104.0*  --  100.3* 100.0  --  92.4 96.5  PLT 136*  --  210 175  --  153 179   < > = values in this interval  not displayed.   Cardiac Enzymes: No results for input(s): CKTOTAL, CKMB, CKMBINDEX, TROPONINI in the last 168 hours. BNP: Invalid input(s): POCBNP CBG: No results for input(s): GLUCAP in the last 168 hours. D-Dimer No results for input(s): DDIMER  in the last 72 hours. Hgb A1c No results for input(s): HGBA1C in the last 72 hours. Lipid Profile No results for input(s): CHOL, HDL, LDLCALC, TRIG, CHOLHDL, LDLDIRECT in the last 72 hours. Thyroid function studies No results for input(s): TSH, T4TOTAL, T3FREE, THYROIDAB in the last 72 hours.  Invalid input(s): FREET3 Anemia work up No results for input(s): VITAMINB12, FOLATE, FERRITIN, TIBC, IRON, RETICCTPCT in the last 72 hours. Urinalysis    Component Value Date/Time   COLORURINE YELLOW 04/09/2018 [place] 04/09/2018 1449   LABSPEC 1.011 04/09/2018 1449   PHURINE 5.0 04/09/2018 1449   GLUCOSEU NEGATIVE 04/09/2018 1449   HGBUR MODERATE (A) 04/09/2018 1449   BILIRUBINUR NEGATIVE 04/09/2018 1449   KETONESUR NEGATIVE 04/09/2018 1449   PROTEINUR NEGATIVE 04/09/2018 1449   NITRITE NEGATIVE 04/09/2018 1449   LEUKOCYTESUR NEGATIVE 04/09/2018 1449   Sepsis Labs Invalid input(s): PROCALCITONIN,  WBC,  LACTICIDVEN Microbiology Recent Results (from the past 240 hour(s))  SARS CORONAVIRUS 2 (TAT 6-24 HRS) Nasopharyngeal Nasopharyngeal Swab     Status: None   Collection Time: 05/23/19  2:51 PM   Specimen: Nasopharyngeal Swab  Result Value Ref Range Status   SARS Coronavirus 2 NEGATIVE NEGATIVE Final    Comment: (NOTE) SARS-CoV-2 target nucleic acids are NOT DETECTED. The SARS-CoV-2 RNA is generally detectable in upper and lower respiratory specimens during the acute phase of infection. Negative results do not preclude SARS-CoV-2 infection, do not rule out co-infections with other pathogens, and should not be used as the sole basis for treatment or other patient management decisions. Negative results must be combined  with clinical observations, patient history, and epidemiological information. The expected result is Negative. Fact Sheet for Patients: SugarRoll.be Fact Sheet for Healthcare Providers: https://www.woods-mathews.com/ This test is not yet approved or cleared by the [place] FDA and  has been authorized for detection and/or diagnosis of SARS-CoV-2 by FDA under an Emergency Use Authorization (EUA). This EUA will remain  in effect (meaning this test can be used) for the duration of the COVID-19 declaration under Section 56 4(b)(1) of the Act, 21 U.S.C. section 360bbb-3(b)(1), unless the authorization is terminated or revoked sooner. Performed at [facility], [place] [address] [place], [place] [address]     Please note: You were cared for by a hospitalist during your hospital stay. Once you are discharged, your primary care physician will handle any further medical issues. Please note that NO REFILLS for any discharge medications will be authorized once you are discharged, as it is imperative that you return to your primary care physician (or establish a relationship with a primary care physician if you do not have one) for your post hospital discharge needs so that they can reassess your need for medications and monitor your lab values.    Time coordinating discharge: 40 minutes  SIGNED:   Shelly Coss, MD  [facility] 05/27/2019, 11:37 AM Pager [phone]  If 7PM-7AM, please contact night-coverage [email or web address] Password [email or web address]

## 2019-05-30 ENCOUNTER — Encounter: Payer: Self-pay | Admitting: *Deleted

## 2019-05-30 LAB — CYCLOSPORINE: Cyclosporine, LabCorp: 83 ng/mL — ABNORMAL LOW (ref 100–400)

## 2019-05-31 ENCOUNTER — Encounter: Payer: Self-pay | Admitting: Gastroenterology

## 2019-06-02 ENCOUNTER — Telehealth: Payer: Self-pay | Admitting: Gastroenterology

## 2019-06-02 NOTE — Telephone Encounter (Signed)
Patient is scheduled for a hospital f/u on 12/18 with [facility]. We are having to reschedule this appointments. There is no more PA appointments and Dr. Havery Moros is booked out until the end of January. Will this patient be okay waiting that long for an appointment? Or what should we do?

## 2019-06-06 ENCOUNTER — Ambulatory Visit: Payer: Medicare Other | Admitting: Nurse Practitioner

## 2019-06-06 ENCOUNTER — Ambulatory Visit: Payer: Medicare Other | Admitting: Physician Assistant

## 2019-06-26 ENCOUNTER — Ambulatory Visit: Payer: Medicare PPO | Admitting: Physician Assistant

## 2019-06-26 ENCOUNTER — Encounter: Payer: Self-pay | Admitting: Physician Assistant

## 2019-06-26 ENCOUNTER — Other Ambulatory Visit (INDEPENDENT_AMBULATORY_CARE_PROVIDER_SITE_OTHER): Payer: Medicare PPO

## 2019-06-26 VITALS — BP 110/70 | HR 68 | Temp 96.6°F | Ht 67.0 in | Wt 159.5 lb

## 2019-06-26 DIAGNOSIS — Z8719 Personal history of other diseases of the digestive system: Secondary | ICD-10-CM

## 2019-06-26 DIAGNOSIS — Z86010 Personal history of colonic polyps: Secondary | ICD-10-CM | POA: Diagnosis not present

## 2019-06-26 DIAGNOSIS — D369 Benign neoplasm, unspecified site: Secondary | ICD-10-CM | POA: Insufficient documentation

## 2019-06-26 DIAGNOSIS — D649 Anemia, unspecified: Secondary | ICD-10-CM

## 2019-06-26 LAB — CBC
HCT: 34.3 % — ABNORMAL LOW (ref 39.0–52.0)
Hemoglobin: 11.1 g/dL — ABNORMAL LOW (ref 13.0–17.0)
MCHC: 32.3 g/dL (ref 30.0–36.0)
MCV: 92.9 fl (ref 78.0–100.0)
Platelets: 292 10*3/uL (ref 150.0–400.0)
RBC: 3.69 Mil/uL — ABNORMAL LOW (ref 4.22–5.81)
RDW: 15.8 % — ABNORMAL HIGH (ref 11.5–15.5)
WBC: 12.4 10*3/uL — ABNORMAL HIGH (ref 4.0–10.5)

## 2019-06-26 NOTE — Progress Notes (Signed)
Agree with assessment and plan as outlined.  

## 2019-06-26 NOTE — Progress Notes (Signed)
Subjective:    Patient ID: Caleb Hicks, male    DOB: Feb 23, 1941, 79 y.o.   MRN: [id number]  HPI Caleb Hicks is a pleasant 79 year old white male, recently known to Dr. Havery Moros and seen during recent hospitalization 12 4/2 through 05/27/2019 at which time he presented with GI bleeding.  Patient has history of hypertension, adult onset diabetes mellitus, end-stage renal disease status post previous failed transplant.  Also with history of chronic anemia. He had an admission in 2018 with a GI bleed which was felt to be diverticular in origin.  He was seen by [facility] at that time and was to be followed as an outpatient for colonoscopy but that did not occur.  Patient's baseline hemoglobin is between 10 and 11..  Patient's hemoglobin dropped to 6.5 while he was in the hospital secondary to bleeding.  He did require transfusions and hemoglobin was 8.7 on discharge. He underwent colonoscopy with Dr. Havery Moros on 05/24/2019 with finding of a 3 mm polyp in the transverse colon there were multiple large and small mouth diverticuli diffusely and he also had a 6 to 7 mm polyp removed from the sigmoid colon this was noted to be inflamed.  Endo Clip was placed. Path on the polyps returned showing tubular adenomas. Patient did require flexible sigmoidoscopy on 05/26/2019 due to recurrent bleeding.  He was found to have an 8 mm ulcer adjacent to the Endo Clip and 2 more endoclips were placed for hemostasis.  He did not have any further active bleeding. Patient comes back in today for follow-up stating that he has been doing well since discharge.  He is on chronic iron therapy and says that his stools stay dark but he has not noted any melena or hematochezia.  He has no complaints of abdominal pain or discomfort.  Appetite has been fine.  Energy level overall is not good but he says that is not unusual for him.  No dizziness or lightheadedness.   Review of Systems Pertinent positive and negative review of systems  were noted in the above HPI section.  All other review of systems was otherwise negative.  Outpatient Encounter Medications as of 06/26/2019  Medication Sig  . allopurinol (ZYLOPRIM) 100 MG tablet Take 200 mg by mouth daily.  Marland Kitchen atorvastatin (LIPITOR) 40 MG tablet Take 1 tablet (40 mg total) by mouth daily.  . calcitRIOL (ROCALTROL) 0.25 MCG capsule Take 1 capsule (0.25 mcg total) by mouth daily. (Patient taking differently: Take 0.25 mcg by mouth every 3 (three) days. )  . calcium carbonate (OS-CAL - DOSED IN MG OF ELEMENTAL CALCIUM) 1250 (500 Ca) MG tablet Take 1-2 tablets by mouth See admin instructions. 2 tablets in the morning, and 1 tablet in the evening  . Ferrous Sulfate (IRON PO) Take 1 tablet by mouth 2 (two) times a day.  . furosemide (LASIX) 40 MG tablet Take 1 tablet (40 mg total) by mouth daily.  Marland Kitchen JANUVIA 25 MG tablet TAKE 1 TABLET(25 MG) BY MOUTH DAILY (Patient taking differently: Take 25 mg by mouth daily. )  . MAGNESIUM-OXIDE 400 (241.3 Mg) MG tablet Take 1 tablet by mouth 2 (two) times a day.  . metoprolol tartrate (LOPRESSOR) 50 MG tablet Take 50 mg by mouth 2 (two) times daily.  Marland Kitchen NEORAL 25 MG capsule Take 2 tablets by mouth 2 (two) times a day.  . pantoprazole (PROTONIX) 40 MG tablet Take 1 tablet (40 mg total) by mouth daily.  . predniSONE (DELTASONE) 5 MG tablet Take 5  mg by mouth daily.  Marland Kitchen senna-docusate (SENOKOT-S) 8.6-50 MG tablet Take 1 tablet by mouth at bedtime. (Patient taking differently: Take 1 tablet by mouth at bedtime as needed for moderate constipation. )  . sodium bicarbonate 650 MG tablet Take 1,300 mg by mouth 2 (two) times daily.   No facility-administered encounter medications on file as of 06/26/2019.   Allergies  Allergen Reactions  . Norvasc [Amlodipine Besylate] Other (See Comments)    Weight gain  . Tape Other (See Comments)    Tears skin   Patient Active Problem List   Diagnosis Date Noted  . Adenomatous polyps 06/26/2019  . Diverticulosis of  colon with hemorrhage   . Benign neoplasm of sigmoid colon   . Lower GI bleed 05/23/2019  . ESRD (end stage renal disease) ([place]) 05/23/2019  . DM2 (diabetes mellitus, type 2) ([place]) 12/26/2018  . CKD (chronic kidney disease) stage 4, GFR 15-29 ml/min (HCC) 04/24/2018  . Diverticulosis with recurrent diverticulitis 04/24/2018  . Iron deficiency anemia 03/25/2017  . Low vitamin B12 level 03/25/2017  . HTN (hypertension) 03/24/2017  . AAA [facility]): 4 cm - repeat 10/2019 03/24/2017  . IgA nephropathy 03/24/2017  . Hyperlipidemia 03/24/2017  . 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 Hyperuricemia 03/24/2017  . Hepatic cyst: Solitary 03/24/2017  . Renal cyst 03/24/2017  . 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 Rectal bleeding    Social History   Socioeconomic History  . Marital status: Divorced    Spouse name: Not on file  . Number of children: Not on file  . Years of education: Not on file  . Highest education level: Not on file  Occupational History  . Not on file  Tobacco Use  . Smoking status: Former Smoker    Packs/day: 2.00    Years: 20.00    Pack years: 40.00    Types: Cigarettes  . Smokeless tobacco: Never Used  . Tobacco comment: 1984  Substance and Sexual Activity  . Alcohol use: No  . Drug use: No  . Sexual activity: Not on file  Other Topics Concern  . Not on file  Social History Narrative  . Not on file   Social Determinants of Health   Financial Resource Strain:   . Difficulty of Paying Living Expenses: Not on file  Food Insecurity:   . Worried About Charity fundraiser in the Last Year: Not on file  . Ran Out of Food in the Last Year: Not on file  Transportation Needs:   . Lack of Transportation (Medical): Not on file  . Lack of Transportation (Non-Medical): Not on file  Physical Activity:   . Days of Exercise per Week: Not on file  . Minutes of Exercise per Session: Not on file  Stress:   . Feeling of Stress : Not on  file  Social Connections:   . Frequency of Communication with Friends and Family: Not on file  . Frequency of Social Gatherings with Friends and Family: Not on file  . Attends Religious Services: Not on file  . Active Member of Clubs or Organizations: Not on file  . Attends Archivist Meetings: Not on file  . Marital Status: Not on file  Intimate Partner Violence:   . Fear of Current or Ex-Partner: Not on file  . Emotionally Abused: Not on file  . Physically Abused: Not on file  . Sexually Abused: Not on file    Caleb Hicks family history includes AAA (  abdominal aortic aneurysm) in his brother; Cerebral aneurysm in his brother; Heart attack in his father.      Objective:    Vitals:   06/26/19 1347  BP: 110/70  Pulse: 68  Temp: (!) 96.6 F (35.9 C)    Physical Exam Well-developed well-nourished elderly WM in no acute distress.  Very Pleasant   weight, 159  BMI 24.9  HEENT; nontraumatic normocephalic, EOMI, PER R LA, sclera anicteric. Oropharynx; not examined/mask/Covid Neck; supple, no JVD Cardiovascular; regular rate and rhythm with S1-S2, no murmur rub or gallop Pulmonary; Clear bilaterally Abdomen; soft, nontender, nondistended, no palpable mass or hepatosplenomegaly, bowel sounds are active, infraumbilical hernia, nontender Rectal; not done today Skin; benign exam, no jaundice rash or appreciable lesions Extremities; no clubbing cyanosis or edema skin warm and dry Neuro/Psych; alert and oriented x4, grossly nonfocal mood and affect appropriate       Assessment & Plan:   #31 79 year old white male with recent hospitalization with diverticular hemorrhage.  Patient was also noted on colonoscopy during hospitalization to have an inflamed friable 6 to 7 mm sigmoid colon polyp which was endoclipped.  He had a second procedure 2 days later due to recurrent bleeding and was found to have an ulcer at the Endo Clip/polyp site and to further endoclips were  placed. Initial bleed still felt most likely diverticular.  #2 acute on chronic anemia-patient required transfusions during admission and hemoglobin 8.7 on discharge. 3.  Adenomatous colon polyps. 4.  Adult onset diabetes mellitus 5.  End-stage renal disease status post failed transplant 6.  Hypertension  Plan; Continue ferrous sulfate twice daily. We will check follow-up CBC today .  Due to age, patient will not likely need follow-up colonoscopy in 5 years. Patient will follow up with Dr. Havery Moros or myself on an as-needed basis.  Kaavya Puskarich Genia Harold PA-C 06/26/2019   Cc: [name], MD

## 2019-06-26 NOTE — Patient Instructions (Addendum)
If you are age 80 or older, your body mass index should be between 23-30. Your Body mass index is 24.98 kg/m. If this is out of the aforementioned range listed, please consider follow up with your Primary Care Provider.  If you are age 59 or younger, your body mass index should be between 19-25. Your Body mass index is 24.98 kg/m. If this is out of the aformentioned range listed, please consider follow up with your Primary Care Provider.   Your provider has requested that you go to the basement level for lab work before leaving today. Press "B" on the elevator. The lab is located at the first door on the left as you exit the elevator.  We will call you with results.  Follow up with me or Dr. Havery Moros as needed.  Thank you for choosing me and [facility].   Amy Esterwood, PA-C

## 2019-06-30 NOTE — Progress Notes (Signed)
Please let patient know his hemoglobin is much better at 11.1

## 2019-07-06 ENCOUNTER — Ambulatory Visit: Payer: Medicare PPO | Attending: Internal Medicine

## 2019-07-06 DIAGNOSIS — Z23 Encounter for immunization: Secondary | ICD-10-CM | POA: Insufficient documentation

## 2019-07-06 NOTE — Progress Notes (Signed)
   Covid-19 Vaccination Clinic  Name:  KOBI MARIO    MRN: [id number] DOB: January 08, 1941  07/06/2019  Mr. Hopwood was observed post Covid-19 immunization for 15 minutes without incidence. He was provided with Vaccine Information Sheet and instruction to access the V-Safe system.   Mr. Santagata was instructed to call 911 with any severe reactions post vaccine: Marland Kitchen Difficulty breathing  . Swelling of your face and throat  . A fast heartbeat  . A bad rash all over your body  . Dizziness and weakness

## 2019-07-27 ENCOUNTER — Ambulatory Visit: Payer: Medicare PPO | Attending: Internal Medicine

## 2019-07-27 DIAGNOSIS — Z23 Encounter for immunization: Secondary | ICD-10-CM | POA: Insufficient documentation

## 2019-07-27 NOTE — Progress Notes (Signed)
   Covid-19 Vaccination Clinic  Name:  JARVIN OGREN    MRN: [id number] DOB: [birth year]  07/27/2019  Mr. Ohlin was observed post Covid-19 immunization for 15 minutes without incidence. He was provided with Vaccine Information Sheet and instruction to access the V-Safe system.   Mr. Canterbury was instructed to call 911 with any severe reactions post vaccine: Marland Kitchen Difficulty breathing  . Swelling of your face and throat  . A fast heartbeat  . A bad rash all over your body  . Dizziness and weakness    Immunizations Administered    Name Date Dose VIS Date Route   Pfizer COVID-19 Vaccine 07/27/2019 11:21 AM 0.3 mL 05/30/2019 Intramuscular   Manufacturer: [place]   Lot: [id number]   [place]: [id number]

## 2019-08-05 ENCOUNTER — Other Ambulatory Visit: Payer: Self-pay | Admitting: Family Medicine

## 2020-02-16 IMAGING — CT CT ABDOMEN AND PELVIS WITHOUT CONTRAST
2 of 4 series · 16 of 46 positions shown, 18 images · non-contrast
Comparison: 11/09/2017

CLINICAL DATA: Unexplained anemia. Suspected retroperitoneal
hemorrhage.

EXAM:
CT ABDOMEN AND PELVIS WITHOUT CONTRAST
TECHNIQUE: Multidetector CT imaging of the abdomen and pelvis was performed
following the standard protocol without IV contrast.

[Series 3: ap without · axial · non-contrast · 0.86mm/px · z∈[+991,+1391]mm · 13 of 91 slices shown, 15 images]
[im 6/91  soft-tissue]
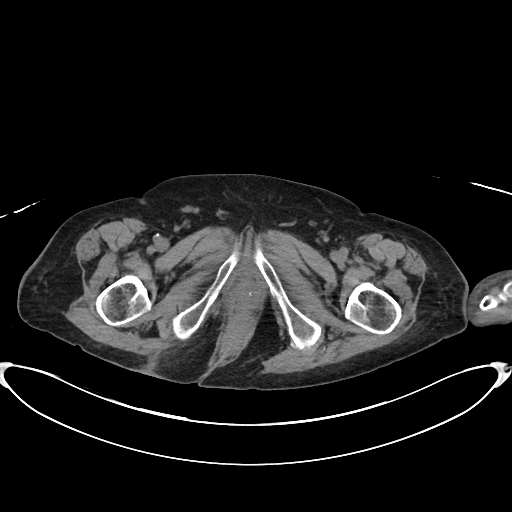
[im 6/91  bone]
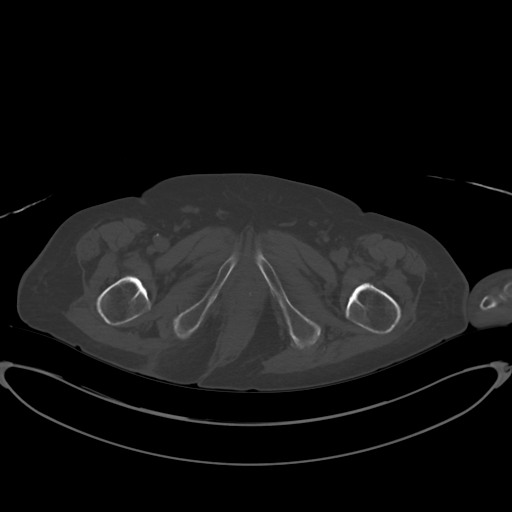
[im 11/91  soft-tissue]
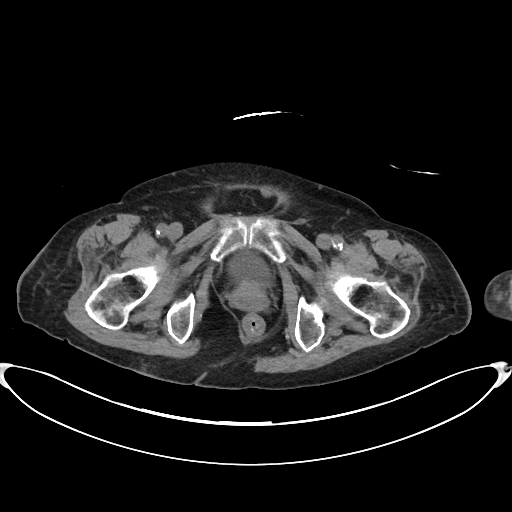
[im 21/91  soft-tissue]
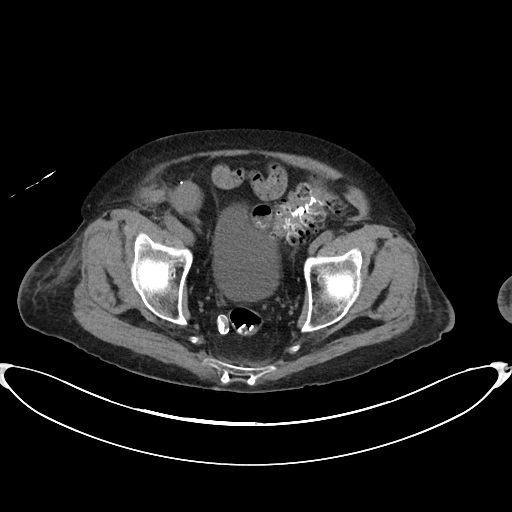
[im 26/91  soft-tissue]
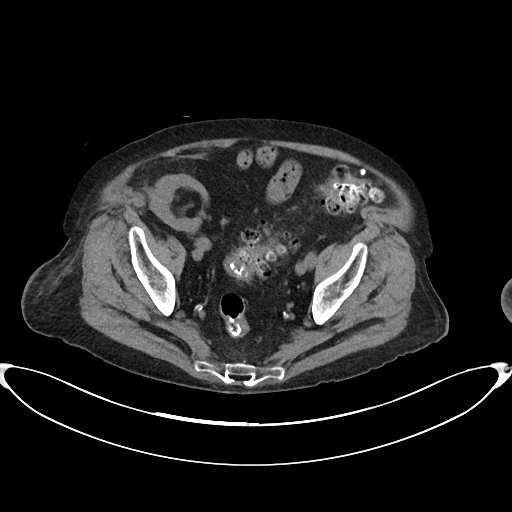
[im 31/91  soft-tissue]
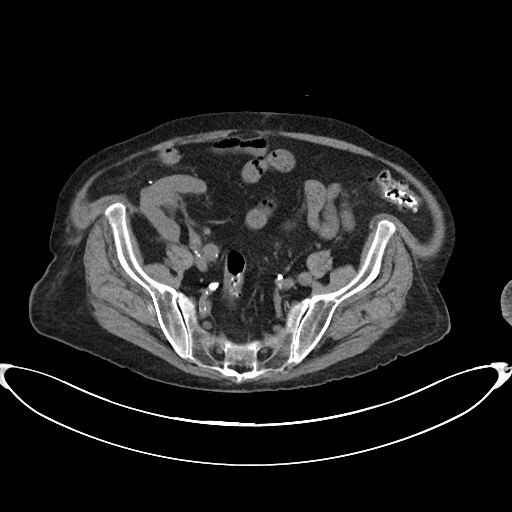
[im 41/91  soft-tissue]
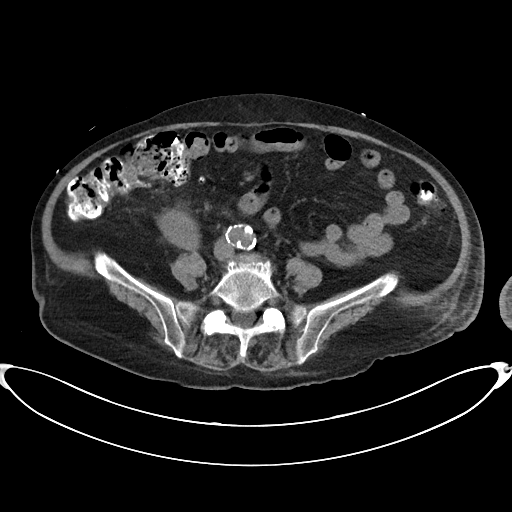
[im 46/91  soft-tissue]
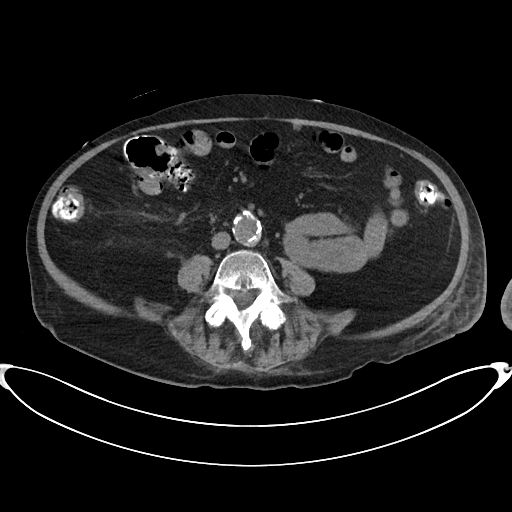
[im 51/91  soft-tissue]
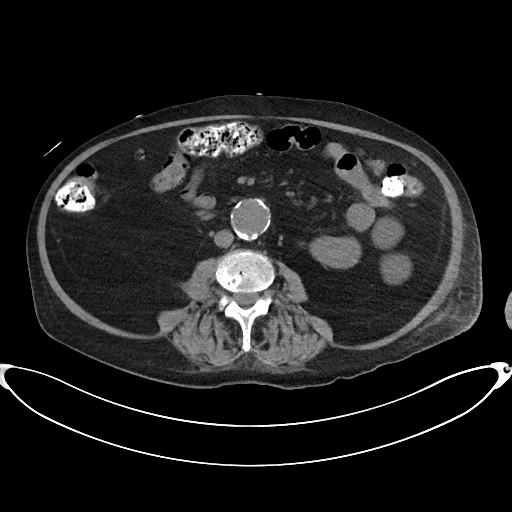
[im 61/91  soft-tissue]
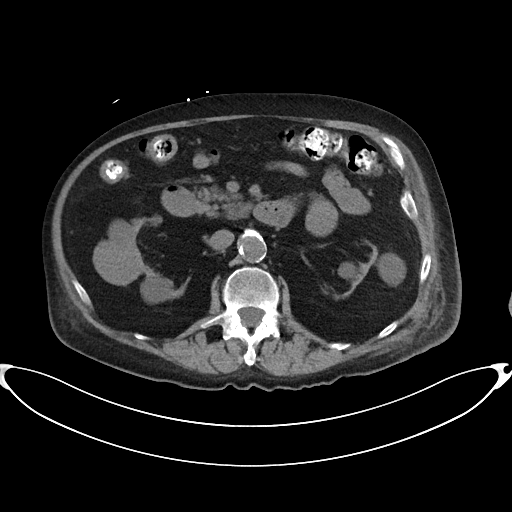
[im 61/91  bone]
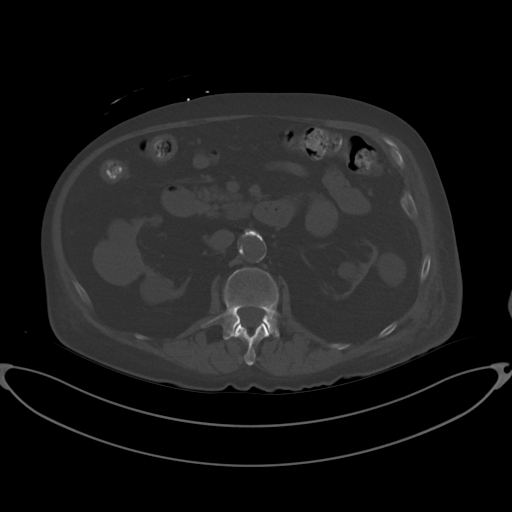
[im 66/91  soft-tissue]
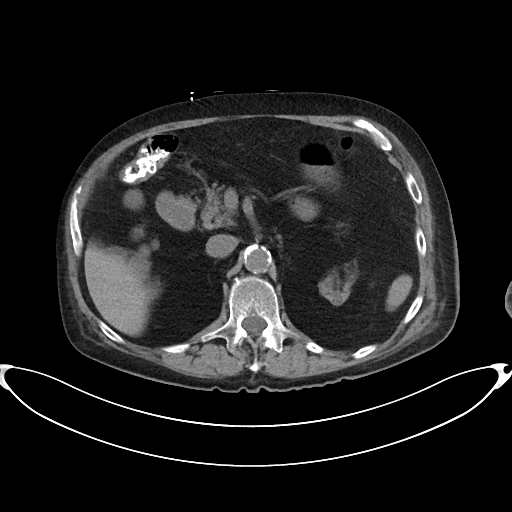
[im 71/91  soft-tissue]
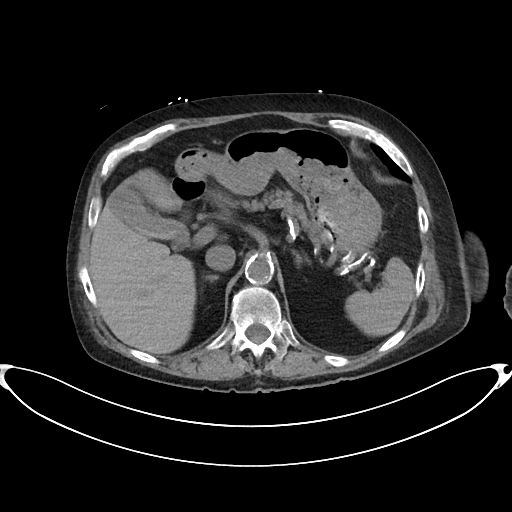
[im 81/91  soft-tissue]
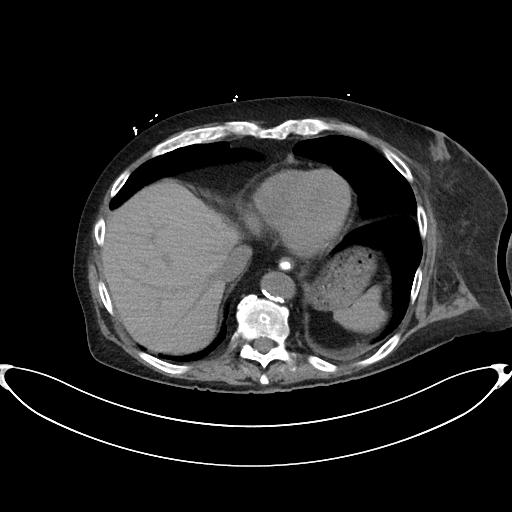
[im 86/91  soft-tissue]
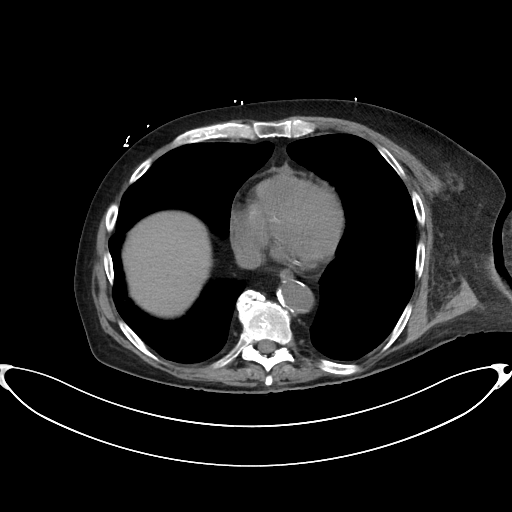

[Series 6: cor · coronal · 0.85mm/px · 3 of 82 slices shown]
[im 28/82  soft-tissue]
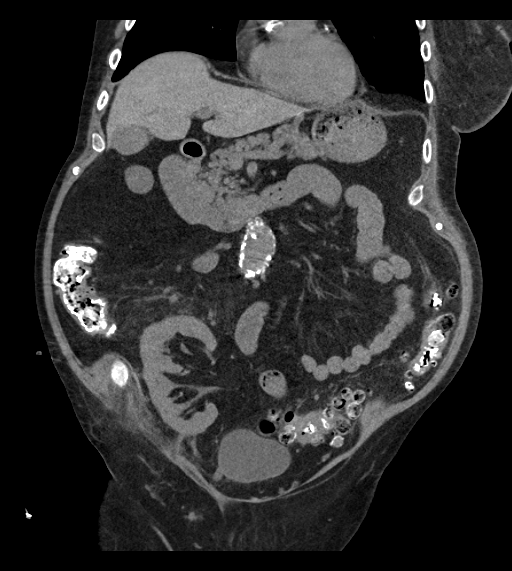
[im 37/82  soft-tissue]
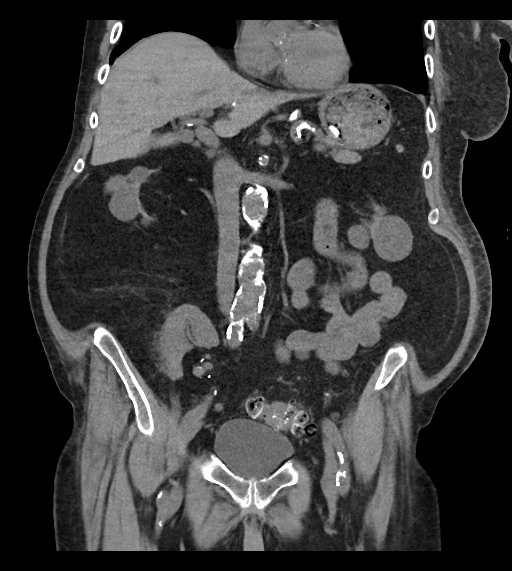
[im 46/82  soft-tissue]
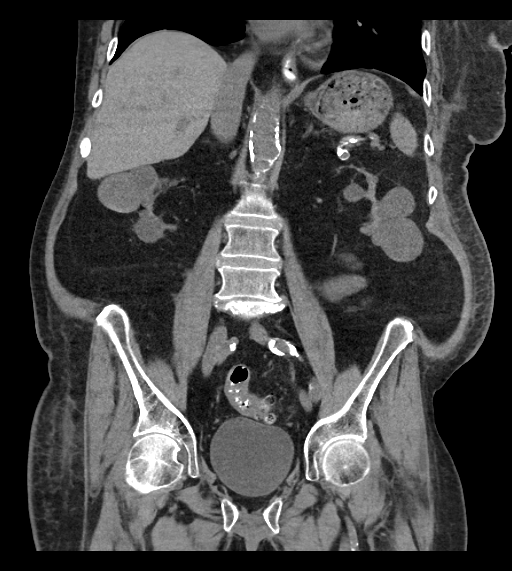

[16 of 46 positions shown; findings below may reference images not displayed]

FINDINGS: Lower chest: No acute findings.

Hepatobiliary: No mass visualized on this unenhanced exam. Tiny
calcified gallstones or sludge are seen, however there is no
evidence of cholecystitis or biliary ductal dilatation.

Pancreas: No mass or inflammatory process visualized on this
unenhanced exam.

Spleen:  Within normal limits in size.

Adrenals/Urinary tract: Diffuse atrophy of native kidneys and
multiple cysts are again seen, including small hyperdense
hemorrhagic cyst in the lower pole the right kidney. No evidence of
urolithiasis or hydronephrosis. Renal transplant is seen in the
right iliac fossa which is unremarkable in appearance on this
unenhanced exam. No evidence of hydronephrosis. Unremarkable
unopacified urinary bladder.

Stomach/Bowel: Severe diverticulosis is seen mainly involving the
sigmoid colon, however there is no evidence of diverticulitis. No
evidence of obstruction, inflammatory process, or abnormal fluid
collections.

Vascular/Lymphatic: No evidence of retroperitoneal hemorrhage. No
pathologically enlarged lymph nodes identified. 3.6 cm infrarenal
abdominal aortic aneurysm is mildly enlarged from 3.3 cm previously.

Reproductive:  No mass or other significant abnormality.

Other:  None.

Musculoskeletal: No suspicious bone lesions identified. Advanced
spondylosis is again seen at L4-5 with grade 2 anterolisthesis.
IMPRESSION: No evidence of retroperitoneal hemorrhage or other acute findings.

3.6 cm infrarenal abdominal aortic aneurysm, mildly increased in
size from prior exam. Recommend followup by ultrasound in 2 years.
This recommendation follows ACR consensus guidelines: White Paper of
the ACR Incidental Findings Committee II on Vascular Findings. [HOSPITAL] 6742; [DATE]. Aortic aneurysm NOS ([id number].B)

Normal appearance of renal transplant. No evidence of transplant
hydronephrosis.

Cholelithiasis.  No radiographic evidence of cholecystitis.

Colonic diverticulosis, without radiographic evidence of
diverticulitis.

## 2020-03-30 ENCOUNTER — Emergency Department (HOSPITAL_COMMUNITY)
Admission: EM | Admit: 2020-03-30 | Discharge: 2020-03-30 | Disposition: A | Discharge status: Home / Self Care | Payer: Medicare PPO | Attending: Emergency Medicine | Admitting: Emergency Medicine

## 2020-03-30 ENCOUNTER — Other Ambulatory Visit: Payer: Self-pay

## 2020-03-30 ENCOUNTER — Emergency Department (HOSPITAL_COMMUNITY): Payer: Medicare PPO

## 2020-03-30 DIAGNOSIS — Z85828 Personal history of other malignant neoplasm of skin: Secondary | ICD-10-CM | POA: Diagnosis not present

## 2020-03-30 DIAGNOSIS — S0990XA Unspecified injury of head, initial encounter: Secondary | ICD-10-CM | POA: Diagnosis present

## 2020-03-30 DIAGNOSIS — I129 Hypertensive chronic kidney disease with stage 1 through stage 4 chronic kidney disease, or unspecified chronic kidney disease: Secondary | ICD-10-CM | POA: Diagnosis not present

## 2020-03-30 DIAGNOSIS — Z87891 Personal history of nicotine dependence: Secondary | ICD-10-CM | POA: Insufficient documentation

## 2020-03-30 DIAGNOSIS — S0181XA Laceration without foreign body of other part of head, initial encounter: Secondary | ICD-10-CM | POA: Insufficient documentation

## 2020-03-30 DIAGNOSIS — Y9289 Other specified places as the place of occurrence of the external cause: Secondary | ICD-10-CM | POA: Diagnosis not present

## 2020-03-30 DIAGNOSIS — Z992 Dependence on renal dialysis: Secondary | ICD-10-CM | POA: Diagnosis not present

## 2020-03-30 DIAGNOSIS — Z23 Encounter for immunization: Secondary | ICD-10-CM | POA: Insufficient documentation

## 2020-03-30 DIAGNOSIS — Y9389 Activity, other specified: Secondary | ICD-10-CM | POA: Insufficient documentation

## 2020-03-30 DIAGNOSIS — Z79899 Other long term (current) drug therapy: Secondary | ICD-10-CM | POA: Insufficient documentation

## 2020-03-30 DIAGNOSIS — E119 Type 2 diabetes mellitus without complications: Secondary | ICD-10-CM | POA: Diagnosis not present

## 2020-03-30 DIAGNOSIS — S61411A Laceration without foreign body of right hand, initial encounter: Secondary | ICD-10-CM | POA: Insufficient documentation

## 2020-03-30 DIAGNOSIS — W01198A Fall on same level from slipping, tripping and stumbling with subsequent striking against other object, initial encounter: Secondary | ICD-10-CM | POA: Insufficient documentation

## 2020-03-30 DIAGNOSIS — R000 Tachycardia, unspecified: Secondary | ICD-10-CM | POA: Diagnosis not present

## 2020-03-30 DIAGNOSIS — N186 End stage renal disease: Secondary | ICD-10-CM | POA: Insufficient documentation

## 2020-03-30 DIAGNOSIS — N184 Chronic kidney disease, stage 4 (severe): Secondary | ICD-10-CM | POA: Diagnosis not present

## 2020-03-30 DIAGNOSIS — W19XXXA Unspecified fall, initial encounter: Secondary | ICD-10-CM

## 2020-03-30 LAB — COMPREHENSIVE METABOLIC PANEL
ALT: 16 U/L (ref 0–44)
AST: 19 U/L (ref 15–41)
Albumin: 2.8 g/dL — ABNORMAL LOW (ref 3.5–5.0)
Alkaline Phosphatase: 80 U/L (ref 38–126)
Anion gap: 13 (ref 5–15)
BUN: 9 mg/dL (ref 8–23)
CO2: 29 mmol/L (ref 22–32)
Calcium: 8.2 mg/dL — ABNORMAL LOW (ref 8.9–10.3)
Chloride: 95 mmol/L — ABNORMAL LOW (ref 98–111)
Creatinine, Ser: 2 mg/dL — ABNORMAL HIGH (ref 0.61–1.24)
GFR, Estimated: 31 mL/min — ABNORMAL LOW (ref >60)
Glucose, Bld: 144 mg/dL — ABNORMAL HIGH (ref 70–99)
Potassium: 3.5 mmol/L (ref 3.5–5.1)
Sodium: 137 mmol/L (ref 135–145)
Total Bilirubin: 0.6 mg/dL (ref 0.3–1.2)
Total Protein: 5.7 g/dL — ABNORMAL LOW (ref 6.5–8.1)

## 2020-03-30 LAB — CBC WITH DIFFERENTIAL/PLATELET
Abs Immature Granulocytes: 0.02 10*3/uL (ref 0.00–0.07)
Basophils Absolute: 0 10*3/uL (ref 0.0–0.1)
Basophils Relative: 1 %
Eosinophils Absolute: 0.1 10*3/uL (ref 0.0–0.5)
Eosinophils Relative: 1 %
HCT: 35.1 % — ABNORMAL LOW (ref 39.0–52.0)
Hemoglobin: 11 g/dL — ABNORMAL LOW (ref 13.0–17.0)
Immature Granulocytes: 0 %
Lymphocytes Relative: 11 %
Lymphs Abs: 0.8 10*3/uL (ref 0.7–4.0)
MCH: 29.5 pg (ref 26.0–34.0)
MCHC: 31.3 g/dL (ref 30.0–36.0)
MCV: 94.1 fL (ref 80.0–100.0)
Monocytes Absolute: 0.4 10*3/uL (ref 0.1–1.0)
Monocytes Relative: 6 %
Neutro Abs: 6.1 10*3/uL (ref 1.7–7.7)
Neutrophils Relative %: 81 %
Platelets: 247 10*3/uL (ref 150–400)
RBC: 3.73 MIL/uL — ABNORMAL LOW (ref 4.22–5.81)
RDW: 15.9 % — ABNORMAL HIGH (ref 11.5–15.5)
WBC: 7.5 10*3/uL (ref 4.0–10.5)
nRBC: 0 % (ref 0.0–0.2)

## 2020-03-30 MED ORDER — LIDOCAINE HCL (PF) 1 % IJ SOLN
5.0000 mL | Freq: Once | INTRAMUSCULAR | Status: AC
  Administered 2020-03-30: 5 mL
  Filled 2020-03-30: qty 5

## 2020-03-30 MED ORDER — TETANUS-DIPHTH-ACELL PERTUSSIS 5-2.5-18.5 LF-MCG/0.5 IM SUSP
0.5000 mL | Freq: Once | INTRAMUSCULAR | Status: AC
  Administered 2020-03-30: 0.5 mL via INTRAMUSCULAR
  Filled 2020-03-30: qty 0.5

## 2020-03-30 MED ORDER — CEPHALEXIN 500 MG PO CAPS: 500.0000 mg | ORAL_CAPSULE | Freq: Three times a day (TID) | ORAL | 0 refills | Status: AC

## 2020-03-30 NOTE — ED Triage Notes (Signed)
EMS arrival from [facility], mechanical fall on carpet with cane, positive head injury, denies LOC. Scant bleeding noted to nose, controlled at this time. Per EMS pt had skin tear to bilateral hands, face and nose. Per EMS pt has not been taking medicine since Friday. Dialysis Tues/Sat, L arm restrict

## 2020-03-30 NOTE — Discharge Instructions (Signed)
Please have Parmvir stitches removed in his right hand in 7 to 10 days.  I am going to prescribe him an antibiotic called Keflex.  He needs to take this for 5 days to prevent infection in his wounds.  I have attached a significant amount of information for wound care.  Like we discussed, please make sure that he starts using a walker to get around.  If he develops any new or worsening symptoms he can always return to the emergency department for reevaluation.  It was a pleasure to meet you both.

## 2020-03-30 NOTE — ED Notes (Signed)
Pt reports receiving full dialysis session today, pt reports falling face down while using cane, pt states " I prefer to use my walker."

## 2020-03-30 NOTE — ED Notes (Signed)
Discharge instructions discussed with pt. Pt verbalized understanding. Pt stable and ambulatory. No signature pad available. 

## 2020-03-30 NOTE — ED Provider Notes (Signed)
[facility] Provider Note   CSN: [id number] Arrival date & time: 03/30/20  1909     History Chief Complaint  Patient presents with  . Fall  . Abrasion    [facility] is a 79 y.o. male.  HPI Patient is a 79 year old male with a medical history as noted below.  Patient states that he has a history of ESRD on HD and is a Tuesday, Thursday, Saturday dialysis patient.  He completed a full dialysis session this morning.  He states he is just generally feeling "out of it".  He ate dinner this evening and was going back up stairs and states that "he just felt uneasy" causing him to trip and fall to a carpeted surface.  He has a skin tear noted just lateral of the left eye with a small amount of bleeding.  Additional skin tear noted to the palmar surface of the right hand.  Patient otherwise has no complaints.  No headache, fevers, chills, nausea, vomiting, dizziness, lightheadedness.  Denies losing consciousness before or after the fall.  Patient states that he has not been taking his medications recently.  He lives at [facility].  Nursing note states that patient has not been taking it for the past 4 days.  Patient states that "it does not make me feel any better".     Past Medical History:  Diagnosis Date  . AAA (abdominal aortic aneurysm) ([place]): 4 cm 03/24/2017   3.5X 3. 5 CM PER 10-6 ABDOMINAL CT FOLOOWED EVRY 2 YEARS   . Abdominal aneurysm ([place])   . Acute kidney injury superimposed on CKD [facility]): Stage 4 03/24/2017  . Anemia   . Arthritis   . Cancer ([place])    skin cancer - basal cell  . Diverticulitis 03/2017   1 UNIT BLOOD GIVEN  . Gout   . Hepatic cyst: Solitary 03/24/2017  . Hepatitis    Hepatitis A  . Hydrocele sac   . Hyperlipidemia 03/24/2017  . Hypertension   . Hyperuricemia 03/24/2017  . 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 Pre-diabetes    Pt states he has "high blood sugar" but is not diabetic or pre-diabetic. He states he hasn't  had an A1C done. He does take Januvia daily.  . 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 Adenomatous polyps 06/26/2019  . Diverticulosis of colon with hemorrhage   . Benign neoplasm of sigmoid colon   . Lower GI bleed 05/23/2019  . ESRD (end stage renal disease) ([place]) 05/23/2019  . DM2 (diabetes mellitus, type 2) ([place]) 12/26/2018  . CKD (chronic kidney disease) stage 4, GFR 15-29 ml/min ([facility]) 04/24/2018  . Diverticulosis with recurrent diverticulitis 04/24/2018  . Iron deficiency anemia 03/25/2017  . Low vitamin B12 level 03/25/2017  . HTN (hypertension) 03/24/2017  . AAA [facility]): 4 cm - repeat 10/2019 03/24/2017  . IgA nephropathy 03/24/2017  . Hyperlipidemia 03/24/2017  . 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 Hyperuricemia 03/24/2017  . Hepatic cyst: Solitary 03/24/2017  . Renal cyst 03/24/2017  . 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 Rectal bleeding     Past Surgical History:  Procedure Laterality Date  . AV FISTULA PLACEMENT Left 11/12/2018   Procedure: BRACHIOBASILIC ARTERIOVENOUS (AV) FISTULA CREATION LEFT ARM;  Surgeon: Waynetta Sandy, MD;  Location: [place];  Service: Vascular;  Laterality: Left;  . AV FISTULA PLACEMENT Left  12/26/2018   Procedure: Insertion Of Arteriovenous (Av) Gore-Tex Graft Left Arm;  Surgeon: Waynetta Sandy, MD;  Location: [place];  Service: Vascular;  Laterality: Left;  . COLONOSCOPY    . COLONOSCOPY N/A 05/24/2019   Procedure: COLONOSCOPY;  Surgeon: Yetta Flock, MD;  Location: [facility];  Service: Gastroenterology;  Laterality: N/A;  . CYST REMOVED FROM SPINE  YRS AGO  . FLEXIBLE SIGMOIDOSCOPY N/A 05/26/2019   Procedure: FLEXIBLE SIGMOIDOSCOPY;  Surgeon: Rush Landmark Telford Nab., MD;  Location: [facility];  Service: Gastroenterology;  Laterality: N/A;  . HEMOSTASIS CLIP PLACEMENT  05/24/2019   Procedure:  HEMOSTASIS CLIP PLACEMENT;  Surgeon: Yetta Flock, MD;  Location: [facility];  Service: Gastroenterology;;  . HEMOSTASIS CLIP PLACEMENT  05/26/2019   Procedure: HEMOSTASIS CLIP PLACEMENT;  Surgeon: Irving Copas., MD;  Location: [facility];  Service: Gastroenterology;;  . HERNIA REPAIR     SEVERAL  . HYDROCELE EXCISION Bilateral 06/04/2017   Procedure: BILATERAL HYDROCELECTOMY ADULT;  Surgeon: Lucas Mallow, MD;  Location[facility];  Service: Urology;  Laterality: Bilateral;  . KIDNEY TRANSPLANT    . PILIODINAL CYST  40-50 YRS AGO  . POLYPECTOMY  05/24/2019   Procedure: POLYPECTOMY;  Surgeon: Yetta Flock, MD;  Location: [facility];  Service: Gastroenterology;;       Family History  Problem Relation Age of Onset  . Heart attack Father   . AAA (abdominal aortic aneurysm) Brother   . Cerebral aneurysm Brother     Social History   Tobacco Use  . Smoking status: Former Smoker    Packs/day: 2.00    Years: 20.00    Pack years: 40.00    Types: Cigarettes  . Smokeless tobacco: Never Used  . Tobacco comment: 1984  Vaping Use  . Vaping Use: Never used  Substance Use Topics  . Alcohol use: No  . Drug use: No    Home Medications Prior to Admission medications   Medication Sig Start Date End Date Taking? Authorizing Provider  allopurinol (ZYLOPRIM) 100 MG tablet Take 200 mg by mouth daily. 04/16/19   [provider]  atorvastatin (LIPITOR) 40 MG tablet Take 1 tablet (40 mg total) by mouth daily. 05/14/18   [name], MD  calcitRIOL (ROCALTROL) 0.25 MCG capsule Take 1 capsule (0.25 mcg total) by mouth daily. Patient taking differently: Take 0.25 mcg by mouth every 3 (three) days.  11/24/17   Charlynne Cousins, MD  calcium carbonate (OS-CAL - DOSED IN MG OF ELEMENTAL CALCIUM) 1250 (500 Ca) MG tablet Take 1-2 tablets by mouth See admin instructions. 2 tablets in the morning, and 1 tablet in the evening    [provider]  Ferrous Sulfate (IRON PO) Take 1 tablet by mouth 2 (two) times a day.    [provider]  furosemide (LASIX) 40 MG tablet Take 1 tablet (40 mg total) by mouth daily. 11/27/17   Charlynne Cousins, MD  JANUVIA 25 MG tablet TAKE 1 TABLET(25 MG) BY MOUTH DAILY 08/05/19   [name], MD  MAGNESIUM-OXIDE 400 (241.3 Mg) MG tablet Take 1 tablet by mouth 2 (two) times a day. 12/19/18   [provider]  metoprolol tartrate (LOPRESSOR) 50 MG tablet Take 50 mg by mouth 2 (two) times daily. 12/25/16   [provider]  NEORAL 25 MG capsule Take 2 tablets by mouth 2 (two) times a day. 10/25/18   [provider]  pantoprazole (PROTONIX) 40 MG tablet Take 1  tablet (40 mg total) by mouth daily. 05/27/19 06/26/19  Shelly Coss, MD  predniSONE (DELTASONE) 5 MG tablet Take 5 mg by mouth daily. 03/16/17   [provider]  senna-docusate (SENOKOT-S) 8.6-50 MG tablet Take 1 tablet by mouth at bedtime. Patient taking differently: Take 1 tablet by mouth at bedtime as needed for moderate constipation.  03/28/17   Eugenie Filler, MD  sodium bicarbonate 650 MG tablet Take 1,300 mg by mouth 2 (two) times daily.    [provider]    Allergies    Norvasc [amlodipine besylate] and Tape  Review of Systems   Review of Systems  All other systems reviewed and are negative. Ten systems reviewed and are negative for acute change, except as noted in the HPI.   Physical Exam Updated Vital Signs BP (!) 133/59 (BP Location: Right Arm)   Pulse (!) 113   Temp 98 F (36.7 C) (Oral)   Resp (!) 97   Ht 5\' 6"  (1.676 m)   Wt 62.8 kg   SpO2 99%   BMI 22.34 kg/m   Physical Exam Vitals and nursing note reviewed.  Constitutional:      General: He is in acute distress.     Appearance: He is normal weight. He is not ill-appearing, toxic-appearing or diaphoretic.     Comments: Well-developed elderly adult male.  Diffuse ecchymosis noted.  Obvious wound to the  face.  He is a bit hard of hearing but otherwise answers questions clearly and coherently.  HENT:     Head: Normocephalic.     Comments: Large skin tear noted just lateral of the left eye.  Additional small puncture wound noted to the bridge of the nose.  Please see images below.  Bleeding controlled at this time.    Right Ear: Tympanic membrane, ear canal and external ear normal. There is no impacted cerumen.     Left Ear: Tympanic membrane, ear canal and external ear normal. There is no impacted cerumen.     Ears:     Comments: No hemotympanum.     Nose: Nose normal.     Mouth/Throat:     Mouth: Mucous membranes are moist.     Pharynx: Oropharynx is clear. No oropharyngeal exudate or posterior oropharyngeal erythema.  Eyes:     General: No scleral icterus.       Right eye: No discharge.        Left eye: No discharge.     Extraocular Movements: Extraocular movements intact.     Conjunctiva/sclera: Conjunctivae normal.     Pupils: Pupils are equal, round, and reactive to light.  Neck:     Comments: No midline C, T, L-spine tenderness.  No obvious trauma noted overlying the neck or back. Cardiovascular:     Rate and Rhythm: Regular rhythm. Tachycardia present.     Pulses: Normal pulses.     Heart sounds: Normal heart sounds. No murmur heard.  No friction rub. No gallop.   Pulmonary:     Effort: Pulmonary effort is normal. No respiratory distress.     Breath sounds: Normal breath sounds. No stridor. No wheezing, rhonchi or rales.  Abdominal:     General: Abdomen is flat.     Palpations: Abdomen is soft.     Tenderness: There is no abdominal tenderness.     Comments: Abdomen is soft and nontender in all 4 quadrants.  Musculoskeletal:        General: Normal range of motion.  Cervical back: Normal range of motion and neck supple. No tenderness.     Comments: No tenderness appreciated along the anterior chest wall or throughout all 4 extremities.  Skin:    General: Skin is warm  and dry.     Comments: Skin tear noted to the palmar surface of the right hand.  Please see images below.  Neurological:     General: No focal deficit present.     Mental Status: He is alert and oriented to person, place, and time.     Comments: Patient speaks clearly, coherently, in complete sentences.  He is alert and oriented.  He is able to move all 4 extremities without difficulty.  Strength is symmetrical in all 4 extremities.  Distal sensation intact.  Psychiatric:        Mood and Affect: Mood normal.        Behavior: Behavior normal.      Right hand  ED Results / Procedures / Treatments   Labs (all labs ordered are listed, but only abnormal results are displayed) Labs Reviewed  COMPREHENSIVE METABOLIC PANEL - Abnormal; Notable for the following components:      Result Value   Chloride 95 (*)    Glucose, Bld 144 (*)    Creatinine, Ser 2.00 (*)    Calcium 8.2 (*)    Total Protein 5.7 (*)    Albumin 2.8 (*)    GFR, Estimated 31 (*)    All other components within normal limits  CBC WITH DIFFERENTIAL/PLATELET - Abnormal; Notable for the following components:   RBC 3.73 (*)    Hemoglobin 11.0 (*)    HCT 35.1 (*)    RDW 15.9 (*)    All other components within normal limits   EKG EKG Interpretation  Date/Time:  Tuesday March 30 2020 19:26:37 EDT Ventricular Rate:  111 PR Interval:    QRS Duration: 76 QT Interval:  344 QTC Calculation: 468 R Axis:   9 Text Interpretation: Sinus tachycardia Low voltage, precordial leads rate faster than prior 12/20 Confirmed by Aletta Edouard [phone]) on 03/30/2020 7:29:22 PM  Radiology CT Head Wo Contrast  Result Date: 03/30/2020 CLINICAL DATA:  Head trauma. Mechanical fall on carpet. Bleeding from nose. EXAM: CT HEAD WITHOUT CONTRAST TECHNIQUE: Contiguous axial images were obtained from the base of the skull through the vertex without intravenous contrast. COMPARISON:  None. FINDINGS: Brain: Remote infarcts involving the right  basal ganglia/overlying corona radiata and the right occipital lobe (PCA territory). Additional patchy white matter hypoattenuation is compatible with chronic microvascular ischemic disease. No evidence of acute large vascular territory infarct. No acute hemorrhage. No hydrocephalus. Mild generalized cerebral atrophy with ex vacuo ventricular dilation. Vascular: Calcific atherosclerosis. Skull: Normal. Negative for fracture or focal lesion. Sinuses/Orbits: Mild ethmoid air cell mucosal thickening. Otherwise, sinuses are clear. Fluid/blood layering within the left nasopharynx. Angulated appearance of the nasal septum, concerning for acute fracture. No evidence of acute orbital abnormality. Other: No mastoid effusions. IMPRESSION: 1. Partially imaged nondisplaced left nasal bone fracture and angulated nasal septal fracture with fluid/blood layering within the posterior nasopharynx. 2. No evidence of acute intracranial abnormality. 3. 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  Mechanical fall. EXAM: CT CERVICAL SPINE WITHOUT CONTRAST TECHNIQUE: Multidetector CT imaging of the cervical spine  was performed without intravenous contrast. Multiplanar CT image reconstructions were also generated. COMPARISON:  None. FINDINGS: Alignment: Levocurvature. Mild anterolisthesis of C4 on C5 and trace retrolisthesis of C5 on C6, favor degenerative given degenerative changes at these levels. Skull base and vertebrae: No acute fracture. Vertebral body heights are maintained. No primary bone lesion or focal pathologic process. Osteopenia. Soft tissues and spinal canal: No prevertebral fluid or swelling. No visible canal hematoma. Disc levels: Multilevel degenerative change with degenerative disc  disease greatest at C5-C6 and C6-C7 where there is disc height loss, endplate sclerosis, and posterior disc osteophyte complexes. No evidence of severe bony canal stenosis. Upper chest: Emphysema without consolidation. Other: Calcific atherosclerosis. IMPRESSION: 1. No evidence of acute fracture or traumatic malalignment. 2. Multilevel degenerative disc disease, greatest at C5-C6 and C6-C7. Electronically Signed   By: Margaretha Sheffield MD   On: 03/30/2020 21:02   Procedures .Marland KitchenLaceration Repair  Date/Time: 03/30/2020 9:30 PM Performed by: Rayna Sexton, PA-C Authorized by: Rayna Sexton, PA-C   Consent:    Consent obtained:  Verbal   Consent given by:  Patient   Risks discussed:  Pain, poor cosmetic result and infection Anesthesia (see MAR for exact dosages):    Anesthesia method:  Local infiltration   Local anesthetic:  Lidocaine 1% w/o epi Laceration details:    Location:  Hand   Hand location:  R palm   Length (cm):  6 Repair type:    Repair type:  Simple Pre-procedure details:    Preparation:  Patient was prepped and draped in usual sterile fashion Exploration:    Hemostasis achieved with:  Direct pressure   Wound exploration: wound explored through full range of motion   Treatment:    Amount of cleaning:  Standard   Irrigation solution:  Sterile saline   Irrigation method:  Pressure wash   Visualized foreign bodies/material removed: no   Skin repair:    Repair method:  Sutures   Suture size:  4-0   Suture material:  Prolene   Number of sutures:  7 Approximation:    Approximation:  Close Post-procedure details:    Dressing:  Open (no dressing)   Patient tolerance of procedure:  Tolerated well, no immediate complications .Marland KitchenLaceration Repair  Date/Time: 03/30/2020 9:31 PM Performed by: Rayna Sexton, PA-C Authorized by: Rayna Sexton, PA-C   Consent:    Consent obtained:  Verbal   Consent given by:  Patient   Risks discussed:  Pain and poor cosmetic  result Anesthesia (see MAR for exact dosages):    Anesthesia method:  None Laceration details:    Location:  Face   Face location:  Nose   Length (cm):  0.5 Repair type:    Repair type:  Simple Pre-procedure details:    Preparation:  Patient was prepped and draped in usual sterile fashion Exploration:    Hemostasis achieved with:  Direct pressure   Wound exploration: wound explored through full range of motion     Contaminated: no   Treatment:    Visualized foreign bodies/material removed: no   Skin repair:    Repair method:  Tissue adhesive Approximation:    Approximation:  Close Post-procedure details:    Dressing:  Open (no dressing)   Patient tolerance of procedure:  Tolerated well, no immediate complications    Medications Ordered in ED Medications - No data to display  ED Course  I have reviewed the triage vital signs and the nursing notes.  Pertinent labs & imaging results that were available during  my care of the patient were reviewed by me and considered in my medical decision making (see chart for details).  Clinical Course as of Mar 31 2211  Tue Mar 31, 7163  3455 79 year old male history of unsteady gait here after mechanical fall.  He is got abrasions and skin tears to his face.  Getting CT head cervical spine.  Wound care.  Hopefully will be able to ambulate and can discharge.   [MB]  2127 Consistent with prior baseline values  Hemoglobin(!): 11.0 [LJ]  2128 Patient's daughter is now at bedside and states that patient has been dealing with lightheadedness/near syncopal episodes after hemodialysis sessions for quite a while.  He was actually dropped down to 2 dialysis sessions per week because of this.  Patient has been ambulating with a cane but feels that he should be using a walker.  His daughter agrees and states that she has one for him that he apparently has not been using.  They agreed to make sure that he gets his walker after being discharged tonight.    [LJ]  2129 No acute intracranial or cervical abnormalities.  CT Cervical Spine Wo Contrast [LJ]    Clinical Course User Index [LJ] Rayna Sexton, PA-C [MB] Hayden Rasmussen, MD   MDM Rules/Calculators/A&P                          Patient is a 79 year old male who presents to the emergency department due to a fall that occurred prior to arrival.  Patient had multiple lacerations to his face and right hand.  I was able to close the laceration on his right hand with Prolene sutures.  I applied Dermabond to the bridge of his nose.  He had an additional skin tear just lateral to the left eye.  This unfortunately was unable to be closed.  Nursing staff clean the wound extensively.  I am also going to place the patient on 5 days of Keflex to help prevent infection.  We discussed wound care.  Suture removal in 7 to 10 days.  CT scan of the head and neck are reassuring.  Findings are noted above and were reviewed by myself.  Attained basic labs on the patient.  Appears similar to baseline.  His vital signs are reassuring.  He was initially tachycardic but this has improved.  He has been oxygenating around 100% on room air.  He is afebrile.  Patient's daughter is at bedside and is going to take him to her house for the night.  They are both comfortable with him being discharged at this time.  They understand he needs to return to the ER if he develops any new or worsening symptoms.  Patient has been ambulating with a cane at baseline but feels that a walker is more appropriate.  His daughter has a walker for him and is planning on making sure that he gets that this evening.  Their questions were answered and they were amicable at the time of discharge.  Note: Portions of this report may have been transcribed using voice recognition software. Every effort was made to ensure accuracy; however, inadvertent computerized transcription errors may be present.    Final Clinical Impression(s) / ED  Diagnoses Final diagnoses:  Fall, initial encounter  Laceration of right hand without foreign body, initial encounter  Facial laceration, initial encounter   Rx / DC Orders ED Discharge Orders         Ordered  cephALEXin (KEFLEX) 500 MG capsule  3 times daily        03/30/20 2201           Rayna Sexton, PA-C 03/30/20 2212    Hayden Rasmussen, MD 03/31/20 1124

## 2020-04-26 ENCOUNTER — Encounter: Payer: Medicare Other | Admitting: Family Medicine

## 2020-04-26 DIAGNOSIS — Z0289 Encounter for other administrative examinations: Secondary | ICD-10-CM

## 2020-05-20 ENCOUNTER — Telehealth: Payer: Self-pay

## 2020-05-20 NOTE — Telephone Encounter (Signed)
Spoke with patient's daughter Alyse Low and scheduled a in-person Palliative Consult for 05/24/20 @ 10AM   COVID screening was negative. No pets in home. Patient lives alone. Daughter will be at consult appointment.    Consent obtained; updated Outlook/Netsmart/Team List and Epic.

## 2020-05-24 ENCOUNTER — Other Ambulatory Visit: Payer: Self-pay

## 2020-05-24 ENCOUNTER — Other Ambulatory Visit: Payer: Medicare PPO | Admitting: Internal Medicine

## 2020-05-24 DIAGNOSIS — R531 Weakness: Secondary | ICD-10-CM

## 2020-05-24 DIAGNOSIS — N186 End stage renal disease: Secondary | ICD-10-CM

## 2020-05-24 DIAGNOSIS — Z7189 Other specified counseling: Secondary | ICD-10-CM

## 2020-05-25 NOTE — Progress Notes (Signed)
[facility] Consult Note Telephone: [phone]  Fax: [phone]  PATIENT NAME: Caleb Hicks DOB: 1941-05-08 MRN: [id number]  PRIMARY CARE PROVIDER:   [name], MD  REFERRING PROVIDER:  Dr. Madelon Hicks  RESPONSIBLE PARTY:   Self Caleb Hicks (daughter/POA)    RECOMMENDATIONS and PLAN:  Palliative care encounter  Z51.5  1.  Advanced care planning:  Explanation of palliative and hospice care to patient and daughter.  Primary goals reviewed and are inclusive of desire to transition goal of care to comfort measures only immediately after the Christmas holiday.  He plans on discontinuing hemodialysis treatments at that time.  He was able to express his awareness that this discontinuation would create a terminal health status.  His daughter is supportive of his decision. He plans to continue to reside in his independent living apartment and receive assistance from his daughter and a friend that visits 2x/week. We discussed need for additional caregiver support as nears EOL.  Patient and daughter will further discuss plans to attain additional caregiver assistance. Plans for updates to nephrologist and hospice physicians. DNR and MOST form were reviewed, completed and uploaded to pt's chart.  Comfort measures without return to hospital were delegated by patient.  Palliative care will f/u with patient in aprox 1 week.  2.  Weakness:  Related to deconditioning and ESRD requiring hemodialysis. Pt is not taking prescription medications except for 1 day per week.  Supportive care and reviewed fall prevention. No desire to improvement of current health status.  Continue use of walker/wheelchair prn. Encouraged balanced meals and hydration.  3.  ESRD on hemodialysis:  Continue treatments 2x/week. Plans for discontinuing same in 18-19 days.  Transition to hospice care after discontinuation of HD.  I spent 60 minutes providing this  consultation,  from 0900 to 1000. More than 50% of the time in this consultation was spent coordinating communication with patient and his daughter/POA.   HISTORY OF PRESENT ILLNESS:  Caleb Hicks is a 79 y.o. 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  Initiation of hemodialysis in June 2021, weakness and fatigue.   Over last several months and worsening of quality of life.  He is able with use of a rollator but spends numerous hours reclining on his sofa. He requires HD 2x/week and reports low BP occurrences during same. He reports increased weakness. Appetite is poor and has no desire to attempt to improve it or any other health issue.  Daughter is primary caregiver and will need additional assistance in the near future.  Palliative Care was asked to help address goals of care.   CODE STATUS: DNAR/DNI  PPS: 40% weak  HOSPICE ELIGIBILITY/DIAGNOSIS: TBD  PAST MEDICAL HISTORY:  Past Medical History:  Diagnosis Date  . AAA (abdominal aortic aneurysm) ([place]): 4 cm 03/24/2017   3.5X 3. 5 CM PER 10-6 ABDOMINAL CT FOLOOWED EVRY 2 YEARS   . Abdominal aneurysm ([place])   . Acute kidney injury superimposed on CKD [facility]): Stage 4 03/24/2017  . Anemia   . Arthritis   . Cancer ([place])    skin cancer - basal cell  . Diverticulitis 03/2017   1 UNIT BLOOD GIVEN  . Gout   . Hepatic cyst: Solitary 03/24/2017  . Hepatitis    Hepatitis A  . Hydrocele sac   . Hyperlipidemia 03/24/2017  . Hypertension   . Hyperuricemia 03/24/2017  . 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 Pre-diabetes  Pt states he has "high blood sugar" but is not diabetic or pre-diabetic. He states he hasn't had an A1C done. He does take Januvia daily.  . Renal transplant, status post: 21 years ago: 1997 03/24/2017    SOCIAL HX: Lives alone in [place] History   Tobacco Use  . Smoking status: Former Smoker    Packs/day: 2.00    Years: 20.00    Pack years: 40.00    Types:  Cigarettes  . Smokeless tobacco: Never Used  . Tobacco comment: 1984  Substance Use Topics  . Alcohol use: No    ALLERGIES:  Allergies  Allergen Reactions  . Norvasc [Amlodipine Besylate] Other (See Comments)    Weight gain  . Tape Other (See Comments)    Tears skin     PERTINENT MEDICATIONS:  Outpatient Encounter Medications as of 05/24/2020  Medication Sig  . allopurinol (ZYLOPRIM) 100 MG tablet Take 200 mg by mouth daily.  Marland Kitchen atorvastatin (LIPITOR) 40 MG tablet Take 1 tablet (40 mg total) by mouth daily.  . calcitRIOL (ROCALTROL) 0.25 MCG capsule Take 1 capsule (0.25 mcg total) by mouth daily. (Patient taking differently: Take 0.25 mcg by mouth every 3 (three) days. )  . calcium carbonate (OS-CAL - DOSED IN MG OF ELEMENTAL CALCIUM) 1250 (500 Ca) MG tablet Take 1-2 tablets by mouth See admin instructions. 2 tablets in the morning, and 1 tablet in the evening  . Ferrous Sulfate (IRON PO) Take 1 tablet by mouth 2 (two) times a day.  . furosemide (LASIX) 40 MG tablet Take 1 tablet (40 mg total) by mouth daily.  Marland Kitchen JANUVIA 25 MG tablet TAKE 1 TABLET(25 MG) BY MOUTH DAILY  . MAGNESIUM-OXIDE 400 (241.3 Mg) MG tablet Take 1 tablet by mouth 2 (two) times a day.  . metoprolol tartrate (LOPRESSOR) 50 MG tablet Take 50 mg by mouth 2 (two) times daily.  Marland Kitchen NEORAL 25 MG capsule Take 2 tablets by mouth 2 (two) times a day.  . predniSONE (DELTASONE) 5 MG tablet Take 5 mg by mouth daily.  Marland Kitchen senna-docusate (SENOKOT-S) 8.6-50 MG tablet Take 1 tablet by mouth at bedtime. (Patient taking differently: Take 1 tablet by mouth at bedtime as needed for moderate constipation. )  . sodium bicarbonate 650 MG tablet Take 1,300 mg by mouth 2 (two) times daily.   No facility-administered encounter medications on file as of 05/24/2020.    PHYSICAL EXAM:   General: NAD, frail and chronically ill appearing, thin male reclining on sofa Cardiovascular: regular rate and rhythm Pulmonary: clear ant fields Abdomen:  soft, nontender, + bowel sounds GU: no suprapubic tenderness Extremities: no edema Skin: exposed skin intact, pale in color Neurological: Generalized weakness, alert and oriented  Gonzella Lex, NP-C

## 2020-06-02 ENCOUNTER — Other Ambulatory Visit: Payer: Medicare PPO | Admitting: Internal Medicine

## 2020-06-02 DIAGNOSIS — Z515 Encounter for palliative care: Secondary | ICD-10-CM

## 2020-06-02 DIAGNOSIS — N186 End stage renal disease: Secondary | ICD-10-CM

## 2020-06-02 DIAGNOSIS — R531 Weakness: Secondary | ICD-10-CM

## 2020-06-03 ENCOUNTER — Other Ambulatory Visit: Payer: Self-pay

## 2020-06-03 NOTE — Progress Notes (Signed)
[facility] Consult Note Telephone: [phone]  Fax: [phone]  PATIENT NAME: Caleb Hicks DOB: 23-Aug-1940 MRN: [id number]  PRIMARY CARE PROVIDER:   [name], MD  REFERRING PROVIDER:  Dr. Madelon Hicks  RESPONSIBLE PARTY:   Self Caleb Hicks (daughter/POA)    RECOMMENDATIONS and PLAN:  Palliative care encounter  Z51.5  1.  Advanced care planning:  MOST and DNR forms are in place.  Pt desires comfort care only along with transition to Hospice care after completing dialysis.   Palliative care will f/u with patient if necessary prior to the admission to hospice.  Discussed pt status with Hospice physician Dr. Gildardo Hicks who agrees that patient has a potential of a less than 6 month life prognosis with the cessation of hemodialysis.   2.  Weakness:  Related to deconditioning and ESRD requiring hemodialysis.  Supportive care. Comfort meals.  Fall risk prevention readdressed.  Family and friend will assist with pt's needs in his home.   3.  ESRD on hemodialysis:  Final hemodialysis treatment will be Tuesday 06/08/2020.  Pt. Has already ceased taking all medications.  Transition to hospice care after discontinuation of HD. Nurse Caleb Hicks will fax Hospice referral order to [facility]   I spent 45 minutes providing this consultation,  from 1300 to 1345. More than 50% of the time in this consultation was spent coordinating communication with patient and nurse Caleb Hicks from hemodialysis clinic.   HISTORY OF PRESENT ILLNESS: Follow-up with Caleb Hicks. He reports that he has increased generalized weakness and still plans on discontinuance of  Hemodialysis in 1 week.  Crea from 12/11 was 4.4.  GFR is not available. Reports that he did discuss his plans with his nephrologist and dialysis clinic.Palliative Care was asked to help address goals of care.   CODE STATUS: DNAR/DNI  PPS: 40% weak  HOSPICE ELIGIBILITY/DIAGNOSIS: YES/ End stage  renal disease stage V. Hemodialysis will be discontinued on 06/08/2020.  PAST MEDICAL HISTORY:  Past Medical History:  Diagnosis Date   AAA (abdominal aortic aneurysm) ([place]): 4 cm 03/24/2017   3.5X 3. 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 He states he hasn't had an A1C done. He does take Januvia daily.   Renal transplant, status post: 21 years ago: 1997 03/24/2017    SOCIAL HX: Lives alone in [place] History   Tobacco Use   Smoking status: Former Smoker    Packs/day: 2.00    Years: 20.00    Pack years: 40.00    Types: Cigarettes   Smokeless tobacco: Never Used   Tobacco comment: 1984  Substance Use Topics   Alcohol use: No    ALLERGIES:  Allergies  Allergen Reactions   Norvasc [Amlodipine Besylate] Other (See Comments)    Weight gain   Tape Other (See Comments)  Tears skin     PERTINENT MEDICATIONS:  Outpatient Encounter Medications as of 06/02/2020  Medication Sig   allopurinol (ZYLOPRIM) 100 MG tablet Take 200 mg by mouth daily.   atorvastatin (LIPITOR) 40 MG tablet Take 1 tablet (40 mg total) by mouth daily.   calcitRIOL (ROCALTROL) 0.25 MCG capsule Take 1 capsule (0.25 mcg total) by mouth daily. (Patient taking differently: Take 0.25 mcg by mouth every 3 (three) days. )   calcium carbonate (OS-CAL - DOSED IN MG OF ELEMENTAL CALCIUM) 1250 (500 Ca) MG tablet Take 1-2 tablets by mouth See admin instructions. 2 tablets in the  morning, and 1 tablet in the evening   Ferrous Sulfate (IRON PO) Take 1 tablet by mouth 2 (two) times a day.   furosemide (LASIX) 40 MG tablet Take 1 tablet (40 mg total) by mouth daily.   JANUVIA 25 MG tablet TAKE 1 TABLET(25 MG) BY MOUTH DAILY   MAGNESIUM-OXIDE 400 (241.3 Mg) MG tablet Take 1 tablet by mouth 2 (two) times a day.   metoprolol tartrate (LOPRESSOR) 50 MG tablet Take 50 mg by mouth 2 (two) times daily.   NEORAL 25 MG capsule Take 2 tablets by mouth 2 (two) times a day.   predniSONE (DELTASONE) 5 MG tablet Take 5 mg by mouth daily.   senna-docusate (SENOKOT-S) 8.6-50 MG tablet Take 1 tablet by mouth at bedtime. (Patient taking differently: Take 1 tablet by mouth at bedtime as needed for moderate constipation. )   sodium bicarbonate 650 MG tablet Take 1,300 mg by mouth 2 (two) times daily.   No facility-administered encounter medications on file as of 06/02/2020.    PHYSICAL EXAM:   General: NAD, frail and chronically ill appearing, thin male reclining on sofa Cardiovascular: regular rate and rhythm Pulmonary: no increased work of breathing Abdomen: soft, nontender, + bowel sounds Extremities: trace edema BLE Skin: exposed skin intact, pale in color Neurological: Generalized weakness, alert and oriented  Gonzella Lex, NP-C
